# Patient Record
Sex: Male | Born: 1975 | Race: White | Hispanic: No | Marital: Single | Smoking: Current every day smoker
Health system: Southern US, Community
[De-identification: ages and names within clinical notes are randomized; demographics above are authoritative.]

## PROBLEM LIST (undated history)

## (undated) DIAGNOSIS — F319 Bipolar disorder, unspecified: Secondary | ICD-10-CM

## (undated) DIAGNOSIS — F200 Paranoid schizophrenia: Secondary | ICD-10-CM

## (undated) DIAGNOSIS — I10 Essential (primary) hypertension: Secondary | ICD-10-CM

## (undated) DIAGNOSIS — F1010 Alcohol abuse, uncomplicated: Secondary | ICD-10-CM

## (undated) HISTORY — PX: DENTAL SURGERY: SHX609

## (undated) HISTORY — DX: Essential (primary) hypertension: I10

## (undated) HISTORY — DX: Paranoid schizophrenia: F20.0

---

## 1997-08-07 ENCOUNTER — Emergency Department (HOSPITAL_COMMUNITY): Admission: EM | Admit: 1997-08-07 | Discharge: 1997-08-07 | Payer: Self-pay | Admitting: Emergency Medicine

## 1998-07-26 ENCOUNTER — Emergency Department (HOSPITAL_COMMUNITY): Admission: EM | Admit: 1998-07-26 | Discharge: 1998-07-26 | Payer: Self-pay | Admitting: Emergency Medicine

## 1998-07-26 ENCOUNTER — Encounter: Payer: Self-pay | Admitting: Emergency Medicine

## 2004-05-19 ENCOUNTER — Emergency Department: Payer: Self-pay | Admitting: Emergency Medicine

## 2005-11-14 ENCOUNTER — Emergency Department (HOSPITAL_COMMUNITY): Admission: EM | Admit: 2005-11-14 | Discharge: 2005-11-14 | Payer: Self-pay | Admitting: Emergency Medicine

## 2005-11-25 ENCOUNTER — Emergency Department (HOSPITAL_COMMUNITY): Admission: EM | Admit: 2005-11-25 | Discharge: 2005-11-25 | Payer: Self-pay | Admitting: Emergency Medicine

## 2012-05-25 ENCOUNTER — Emergency Department (HOSPITAL_COMMUNITY)
Admission: EM | Admit: 2012-05-25 | Discharge: 2012-05-26 | Disposition: A | Discharge status: Home / Self Care | Payer: Medicaid Other | Attending: Emergency Medicine | Admitting: Emergency Medicine

## 2012-05-25 DIAGNOSIS — F1010 Alcohol abuse, uncomplicated: Secondary | ICD-10-CM | POA: Insufficient documentation

## 2012-05-25 DIAGNOSIS — IMO0002 Reserved for concepts with insufficient information to code with codable children: Secondary | ICD-10-CM

## 2012-05-25 LAB — POCT I-STAT, CHEM 8
Calcium, Ion: 1.09 mmol/L — ABNORMAL LOW (ref 1.12–1.23)
Hemoglobin: 16.3 g/dL (ref 13.0–17.0)
Sodium: 141 mEq/L (ref 135–145)
TCO2: 20 mmol/L (ref 0–100)

## 2012-05-25 LAB — RAPID URINE DRUG SCREEN, HOSP PERFORMED
Barbiturates: NOT DETECTED
Tetrahydrocannabinol: NOT DETECTED

## 2012-05-25 LAB — SALICYLATE LEVEL: Salicylate Lvl: <2 mg/dL — ABNORMAL LOW (ref 2.8–20.0)

## 2012-05-25 LAB — ACETAMINOPHEN LEVEL: Acetaminophen (Tylenol), Serum: <15 ug/mL (ref 10–30)

## 2012-05-25 NOTE — ED Notes (Signed)
Pt daughter at bedside. States that he fell tonight on the floor from sitting position from couch to floor couch. When he feel he possibly tapped his head on the floor. She states there was some LOC because he did not respond to her nor his grandson jumping on his back playing with him.

## 2012-05-25 NOTE — ED Provider Notes (Addendum)
History     CSN: [id number]  Arrival date & time 05/25/12  2034   First MD Initiated Contact with Patient 05/25/12 2045      No chief complaint on file.   (Consider location/radiation/quality/duration/timing/severity/associated sxs/prior treatment) HPI Comments: EMS was called to the home by patients daughter who found him passed out in the home.  A number of empty beer bottles near him as well as a bottle of Seroquel "almost full"  EMS did not bring bottle nor note fill date and number of tablets missing.    The history is provided by the EMS personnel.    No past medical history on file.  No past surgical history on file.  No family history on file.  History  Substance Use Topics  . Smoking status: Not on file  . Smokeless tobacco: Not on file  . Alcohol Use: Not on file      Review of Systems  Unable to perform ROS: Patient unresponsive  Constitutional: Negative for fever.  All other systems reviewed and are negative.    Allergies  Review of patient's allergies indicates no known allergies.  Home Medications  No current outpatient prescriptions on file.  BP 136/80  Pulse 116  Temp(Src) 98 F (36.7 C) (Oral)  Resp 17  SpO2 98%  Physical Exam  Nursing note and vitals reviewed. Constitutional: He appears well-developed and well-nourished.  HENT:  Head: Normocephalic.  Eyes: Pupils are equal, round, and reactive to light.  Cardiovascular: Regular rhythm.  Tachycardia present.   Pulmonary/Chest: Effort normal and breath sounds normal. No respiratory distress. He has no wheezes.  Abdominal: Soft. Bowel sounds are normal. He exhibits no distension.  Musculoskeletal: He exhibits no tenderness.  Neurological:  Patient rouses to deep sternal rub   Skin: Skin is warm. No rash noted. He is not diaphoretic.    ED Course  Procedures (including critical care time)  Labs Reviewed  CBC WITH DIFFERENTIAL - Abnormal; Notable for the following:    Neutrophils  Relative 36 (*)    Lymphocytes Relative 50 (*)    All other components within normal limits  ETHANOL - Abnormal; Notable for the following:    Alcohol, Ethyl (B) 273 (*)    All other components within normal limits  SALICYLATE LEVEL - Abnormal; Notable for the following:    Salicylate Lvl <2.0 (*)    All other components within normal limits  POCT I-STAT, CHEM 8 - Abnormal; Notable for the following:    BUN 5 (*)    Glucose, Bld 103 (*)    Calcium, Ion 1.09 (*)    All other components within normal limits  URINE RAPID DRUG SCREEN (HOSP PERFORMED)  ACETAMINOPHEN LEVEL   No results found.   1. Intoxication       MDM   Patient received 2 L of IV fluids.  He's eaten a, meal.  He's been up  ambulating in the hallway.  He does not want detox.  Family is willing to transport him home        Arman Filter, NP 05/26/12 0117  Arman Filter, NP 06/03/12 2027

## 2012-05-25 NOTE — ED Notes (Signed)
ZOX:[id number]<AV> Expected date:05/25/12<BR> Expected time: 8:20 PM<BR> Means of arrival:Ambulance<BR> Comments:<BR> Etoh; medical clearance; decreased loc; syncopal episode;

## 2012-05-25 NOTE — ED Notes (Signed)
Possible Seroquel on OD. Per EMS bottle was found and almost full. There were several 40 oz can of alcohol on the counter. Pt found incontinent of urine. Denies recreational drug use. EMS was called by his daughter.  EKG unremarkable CBG 170

## 2012-05-25 NOTE — ED Notes (Signed)
Pt daughter : [phone]

## 2012-05-26 LAB — CBC WITH DIFFERENTIAL/PLATELET
Basophils Relative: 0 % (ref 0–1)
Eosinophils Absolute: 0.3 10*3/uL (ref 0.0–0.7)
Eosinophils Relative: 5 % (ref 0–5)
HCT: 43.1 % (ref 39.0–52.0)
Hemoglobin: 15 g/dL (ref 13.0–17.0)
MCH: 32 pg (ref 26.0–34.0)
MCHC: 34.8 g/dL (ref 30.0–36.0)
MCV: 91.9 fL (ref 78.0–100.0)
Monocytes Absolute: 0.6 10*3/uL (ref 0.1–1.0)
Monocytes Relative: 10 % (ref 3–12)
Neutrophils Relative %: 36 % — ABNORMAL LOW (ref 43–77)

## 2012-05-26 NOTE — ED Notes (Signed)
Pt walked to the bathroom with writer w/ an unsteady gait, pt is still unable to even put on his own pants without help.  Pt walked back to the stretcher and his knees buckled. Writer and RN helped pt back up to ambulate back onto the stretcher.

## 2012-05-26 NOTE — ED Notes (Signed)
Pt ambulated to the bathroom with minimum assistance. Requested black coffee.

## 2012-05-27 NOTE — ED Provider Notes (Signed)
Medical screening examination/treatment/procedure(s) were performed by non-physician practitioner and as supervising physician I was immediately available for consultation/collaboration.  Alysia Scism, MD 05/27/12 0608 

## 2012-06-03 NOTE — ED Provider Notes (Signed)
Medical screening examination/treatment/procedure(s) were performed by non-physician practitioner and as supervising physician I was immediately available for consultation/collaboration.   Charles B. Sheldon, MD 06/03/12 2338 

## 2015-04-04 ENCOUNTER — Encounter: Payer: Self-pay | Admitting: *Deleted

## 2015-04-04 ENCOUNTER — Ambulatory Visit (INDEPENDENT_AMBULATORY_CARE_PROVIDER_SITE_OTHER): Payer: Medicaid Other | Admitting: Cardiovascular Disease

## 2015-04-04 VITALS — BP 148/92 | HR 84 | Ht 72.0 in | Wt 224.0 lb

## 2015-04-04 DIAGNOSIS — Z720 Tobacco use: Secondary | ICD-10-CM

## 2015-04-04 DIAGNOSIS — R079 Chest pain, unspecified: Secondary | ICD-10-CM | POA: Diagnosis not present

## 2015-04-04 DIAGNOSIS — I10 Essential (primary) hypertension: Secondary | ICD-10-CM

## 2015-04-04 DIAGNOSIS — R0601 Orthopnea: Secondary | ICD-10-CM

## 2015-04-04 DIAGNOSIS — Z8249 Family history of ischemic heart disease and other diseases of the circulatory system: Secondary | ICD-10-CM | POA: Diagnosis not present

## 2015-04-04 MED ORDER — AMLODIPINE BESYLATE 5 MG PO TABS: 5.0000 mg | ORAL_TABLET | Freq: Every day | ORAL | Status: DC

## 2015-04-04 NOTE — Patient Instructions (Signed)
   Begin Norvasc  daily - new sent to [facility]. Continue all other medications.   Your physician has requested that you have an echocardiogram. Echocardiography is a painless test that uses sound waves to create images of your heart. It provides your doctor with information about the size and shape of your heart and how well your heart's chambers and valves are working. This procedure takes approximately one hour. There are no restrictions for this procedure. Office will contact with results via phone or letter.

## 2015-04-04 NOTE — Progress Notes (Signed)
Patient ID: Leonard Guerrero, male   DOB: Jul 26, 1975, 40 y.o.   MRN: [id number]       CARDIOLOGY CONSULT NOTE  Patient ID: Leonard Guerrero MRN: [id number] DOB/AGE: May 10, 1975 40 y.o.  Admit date: (Not on file) Primary Physician Alvina Filbert, MD  Reason for Consultation: chest pain  HPI: The patient is a 40 year old male with a history of paranoid schizophrenia and no known cardiovascular disease who is referred for evaluation of chest pain. He has chest pain primarily when lying down at night. Denies exertional chest pain. Has chronic exertional dyspnea which he attributes to a long history of smoking 2 packs daily since he was a teenager. He says he has to prop himself up on several pillows in order to breathe comfortably at night. Denies paroxysmal nocturnal dyspnea and leg swelling. Also denies dizziness, palpitations, and syncope. Said he had an ECG performed 2 weeks ago but neither outpatient notes or this ECG are unavailable to me at this time.  He said he was on blood pressure medications when he previously lived in [place] but after moving to [place] the first time, he was taken off of them. Said his blood pressure has been elevated at the last 6 visits with his psychologist.  He is moving to [place] this Jun 22, 2022.  Fam: Father died of "enlarged heart" at 39, grandmother died of same at 51.    No Known Allergies  Current Outpatient Prescriptions  Medication Sig Dispense Refill  . benztropine (COGENTIN) 1 MG tablet Take 1 mg by mouth every morning. & 1/2 tablet at bedtime    . haloperidol (HALDOL) 0.5 MG tablet Take 0.5 mg by mouth 2 (two) times daily.    Marland Kitchen lamoTRIgine (LAMICTAL) 25 MG tablet Take 25 mg by mouth 2 (two) times daily.    . Paliperidone Palmitate (INVEGA TRINZA IM) Inject into the muscle.    Marland Kitchen QUEtiapine (SEROQUEL XR) 400 MG 24 hr tablet Take 800 mg by mouth at bedtime.     No current facility-administered medications for this visit.    Past Medical History   Diagnosis Date  . Hypertension   . Paranoid schizophrenia (HCC)     No past surgical history on file.  Social History   Social History  . Marital Status: Single    Spouse Name: N/A  . Number of Children: N/A  . Years of Education: N/A   Occupational History  . Not on file.   Social History Main Topics  . Smoking status: Current Every Day Smoker -- 1.50 packs/day    Types: Cigarettes  . Smokeless tobacco: Never Used  . Alcohol Use: 0.0 oz/week    0 Standard drinks or equivalent per week  . Drug Use: Not on file  . Sexual Activity: Not on file   Other Topics Concern  . Not on file   Social History Narrative  . No narrative on file      Prior to Admission medications   Medication Sig Start Date End Date Taking? Authorizing Provider  benztropine (COGENTIN) 1 MG tablet Take 1 mg by mouth every morning. & 1/2 tablet at bedtime   Yes Historical Provider, MD  haloperidol (HALDOL) 0.5 MG tablet Take 0.5 mg by mouth 2 (two) times daily.   Yes Historical Provider, MD  lamoTRIgine (LAMICTAL) 25 MG tablet Take 25 mg by mouth 2 (two) times daily.   Yes Historical Provider, MD  Paliperidone Palmitate (INVEGA TRINZA IM) Inject into the muscle.   Yes Historical Provider, MD  QUEtiapine (  SEROQUEL XR) 400 MG 24 hr tablet Take 800 mg by mouth at bedtime.   Yes Historical Provider, MD     Review of systems complete and found to be negative unless listed above in HPI     Physical exam Blood pressure 148/92, pulse 84, height 6' (1.829 m), weight 224 lb (101.606 kg), SpO2 98 %. General: NAD Neck: No JVD, no thyromegaly or thyroid nodule.  Lungs: Diminished throughout, without rales or wheezes. CV: Nondisplaced PMI. Regular rate and rhythm, normal S1/S2, no S3/S4, no murmur.  No peripheral edema.  No carotid bruit.   Abdomen: Soft, nontender, no distention.  Skin: Intact without lesions or rashes.  Neurologic: Alert and oriented x 3.  Psych: Normal affect. Extremities: No  clubbing or cyanosis.  HEENT: Normal.   ECG: Most recent ECG reviewed.  Labs:   Lab Results  Component Value Date   WBC 6.0 05/25/2012   HGB 16.3 05/25/2012   HCT 48.0 05/25/2012   MCV 91.9 05/25/2012   PLT 161 05/25/2012   No results for input(s): NA, K, CL, CO2, BUN, CREATININE, CALCIUM, PROT, BILITOT, ALKPHOS, ALT, AST, GLUCOSE in the last 168 hours.  Invalid input(s): LABALBU No results found for: CKTOTAL, CKMB, CKMBINDEX, TROPONINI No results found for: CHOL No results found for: HDL No results found for: LDLCALC No results found for: TRIG No results found for: CHOLHDL No results found for: LDLDIRECT       Studies: No results found.  ASSESSMENT AND PLAN:  1. Chest pain: Atypical features for ischemic heart disease, but has several CV risk factors including essential hypertension, tobacco abuse, and family history significant for premature coronary artery disease. Because he is moving to [place] this Friday, I am unable to pursue stress testing. He will need to have one done when he moves to [place]. I will try to obtain a copy of his ECG performed 2 weeks ago. I will order a 2-D echocardiogram with Doppler to evaluate cardiac structure, function, and regional wall motion.  2. Orthopnea: Symptoms suspicious for left ventricular dysfunction/CHF. I will order a 2-D echocardiogram with Doppler to evaluate cardiac structure, function, and regional wall motion.   3. Essential HTN: Will provide a 90-day supply of amlodipine 5 mg daily.  Dispo: f/u prn.     Signed: Prentice Docker, M.D., F.A.C.C.  04/04/2015, 10:15 AM

## 2015-04-05 ENCOUNTER — Other Ambulatory Visit: Payer: Self-pay

## 2015-04-05 ENCOUNTER — Ambulatory Visit (INDEPENDENT_AMBULATORY_CARE_PROVIDER_SITE_OTHER): Payer: Medicaid Other

## 2015-04-05 DIAGNOSIS — R079 Chest pain, unspecified: Secondary | ICD-10-CM

## 2015-04-05 DIAGNOSIS — R0601 Orthopnea: Secondary | ICD-10-CM

## 2015-04-06 ENCOUNTER — Telehealth: Payer: Self-pay | Admitting: *Deleted

## 2015-04-06 NOTE — Telephone Encounter (Signed)
Notes Recorded by Lesle Chris, LPN on 0/10/8117 at 1:42 PM Patient notified. Copy to pmd.

## 2015-04-06 NOTE — Telephone Encounter (Signed)
-----   Message from Laqueta Linden, MD sent at 04/06/2015 11:05 AM EST ----- Normal pumping function.

## 2016-06-07 ENCOUNTER — Emergency Department (HOSPITAL_COMMUNITY): Payer: Medicaid Other

## 2016-06-07 ENCOUNTER — Emergency Department (HOSPITAL_COMMUNITY): DRG: 917 | Payer: Medicaid Other | Attending: Internal Medicine

## 2016-06-07 ENCOUNTER — Inpatient Hospital Stay (HOSPITAL_COMMUNITY)
Admission: EM | Admit: 2016-06-07 | Discharge: 2016-06-13 | Disposition: A | Discharge status: Other Acute Inpatient Hospital | Payer: Medicaid Other | Source: Home / Self Care | Attending: Pulmonary Disease | Admitting: Pulmonary Disease

## 2016-06-07 ENCOUNTER — Encounter (HOSPITAL_COMMUNITY): Payer: Self-pay | Admitting: Emergency Medicine

## 2016-06-07 DIAGNOSIS — J9601 Acute respiratory failure with hypoxia: Secondary | ICD-10-CM

## 2016-06-07 DIAGNOSIS — F10120 Alcohol abuse with intoxication, uncomplicated: Secondary | ICD-10-CM

## 2016-06-07 DIAGNOSIS — G92 Toxic encephalopathy: Secondary | ICD-10-CM | POA: Diagnosis present

## 2016-06-07 DIAGNOSIS — J690 Pneumonitis due to inhalation of food and vomit: Secondary | ICD-10-CM

## 2016-06-07 DIAGNOSIS — G40909 Epilepsy, unspecified, not intractable, without status epilepticus: Secondary | ICD-10-CM | POA: Diagnosis present

## 2016-06-07 DIAGNOSIS — J9600 Acute respiratory failure, unspecified whether with hypoxia or hypercapnia: Secondary | ICD-10-CM

## 2016-06-07 DIAGNOSIS — T50902A Poisoning by unspecified drugs, medicaments and biological substances, intentional self-harm, initial encounter: Secondary | ICD-10-CM | POA: Diagnosis present

## 2016-06-07 DIAGNOSIS — E860 Dehydration: Secondary | ICD-10-CM

## 2016-06-07 DIAGNOSIS — F339 Major depressive disorder, recurrent, unspecified: Secondary | ICD-10-CM | POA: Diagnosis not present

## 2016-06-07 DIAGNOSIS — F17210 Nicotine dependence, cigarettes, uncomplicated: Secondary | ICD-10-CM | POA: Diagnosis not present

## 2016-06-07 DIAGNOSIS — E876 Hypokalemia: Secondary | ICD-10-CM

## 2016-06-07 DIAGNOSIS — Z79899 Other long term (current) drug therapy: Secondary | ICD-10-CM

## 2016-06-07 DIAGNOSIS — E872 Acidosis: Secondary | ICD-10-CM | POA: Diagnosis present

## 2016-06-07 DIAGNOSIS — R001 Bradycardia, unspecified: Secondary | ICD-10-CM | POA: Diagnosis present

## 2016-06-07 DIAGNOSIS — Y92009 Unspecified place in unspecified non-institutional (private) residence as the place of occurrence of the external cause: Secondary | ICD-10-CM | POA: Diagnosis not present

## 2016-06-07 DIAGNOSIS — T1491XA Suicide attempt, initial encounter: Secondary | ICD-10-CM | POA: Diagnosis not present

## 2016-06-07 DIAGNOSIS — G9340 Encephalopathy, unspecified: Secondary | ICD-10-CM | POA: Diagnosis present

## 2016-06-07 DIAGNOSIS — E162 Hypoglycemia, unspecified: Secondary | ICD-10-CM | POA: Diagnosis present

## 2016-06-07 DIAGNOSIS — F200 Paranoid schizophrenia: Secondary | ICD-10-CM

## 2016-06-07 DIAGNOSIS — R402431 Glasgow coma scale score 3-8, in the field [EMT or ambulance]: Secondary | ICD-10-CM | POA: Diagnosis present

## 2016-06-07 DIAGNOSIS — F203 Undifferentiated schizophrenia: Secondary | ICD-10-CM | POA: Diagnosis not present

## 2016-06-07 DIAGNOSIS — F10920 Alcohol use, unspecified with intoxication, uncomplicated: Secondary | ICD-10-CM | POA: Diagnosis not present

## 2016-06-07 DIAGNOSIS — I272 Pulmonary hypertension, unspecified: Secondary | ICD-10-CM | POA: Diagnosis present

## 2016-06-07 DIAGNOSIS — I959 Hypotension, unspecified: Secondary | ICD-10-CM

## 2016-06-07 DIAGNOSIS — R4182 Altered mental status, unspecified: Secondary | ICD-10-CM | POA: Diagnosis not present

## 2016-06-07 DIAGNOSIS — B953 Streptococcus pneumoniae as the cause of diseases classified elsewhere: Secondary | ICD-10-CM | POA: Diagnosis present

## 2016-06-07 DIAGNOSIS — F1099 Alcohol use, unspecified with unspecified alcohol-induced disorder: Secondary | ICD-10-CM | POA: Diagnosis not present

## 2016-06-07 DIAGNOSIS — T510X2A Toxic effect of ethanol, intentional self-harm, initial encounter: Secondary | ICD-10-CM | POA: Diagnosis present

## 2016-06-07 DIAGNOSIS — Z4659 Encounter for fitting and adjustment of other gastrointestinal appliance and device: Secondary | ICD-10-CM

## 2016-06-07 DIAGNOSIS — R45851 Suicidal ideations: Secondary | ICD-10-CM | POA: Diagnosis not present

## 2016-06-07 DIAGNOSIS — F1420 Cocaine dependence, uncomplicated: Secondary | ICD-10-CM | POA: Diagnosis not present

## 2016-06-07 DIAGNOSIS — R0600 Dyspnea, unspecified: Secondary | ICD-10-CM

## 2016-06-07 HISTORY — DX: Bipolar disorder, unspecified: F31.9

## 2016-06-07 HISTORY — DX: Alcohol abuse, uncomplicated: F10.10

## 2016-06-07 LAB — COMPREHENSIVE METABOLIC PANEL
ALBUMIN: 3.7 g/dL (ref 3.5–5.0)
ALT: 26 U/L (ref 17–63)
AST: 33 U/L (ref 15–41)
Alkaline Phosphatase: 52 U/L (ref 38–126)
Anion gap: 13 (ref 5–15)
BUN: 8 mg/dL (ref 6–20)
CHLORIDE: 106 mmol/L (ref 101–111)
CO2: 18 mmol/L — ABNORMAL LOW (ref 22–32)
Calcium: 8.5 mg/dL — ABNORMAL LOW (ref 8.9–10.3)
Creatinine, Ser: 1.16 mg/dL (ref 0.61–1.24)
GFR calc Af Amer: >60 mL/min (ref >60)
GFR calc non Af Amer: >60 mL/min (ref >60)
GLUCOSE: 134 mg/dL — AB (ref 65–99)
POTASSIUM: 4.2 mmol/L (ref 3.5–5.1)
SODIUM: 137 mmol/L (ref 135–145)
Total Bilirubin: 0.9 mg/dL (ref 0.3–1.2)
Total Protein: 6.2 g/dL — ABNORMAL LOW (ref 6.5–8.1)

## 2016-06-07 LAB — CBC WITH DIFFERENTIAL/PLATELET
BASOS ABS: 0 10*3/uL (ref 0.0–0.1)
BASOS PCT: 0 %
EOS PCT: 2 %
Eosinophils Absolute: 0.2 10*3/uL (ref 0.0–0.7)
HEMATOCRIT: 38.1 % — AB (ref 39.0–52.0)
Hemoglobin: 12.9 g/dL — ABNORMAL LOW (ref 13.0–17.0)
LYMPHS ABS: 2.6 10*3/uL (ref 0.7–4.0)
LYMPHS PCT: 22 %
MCH: 31.8 pg (ref 26.0–34.0)
MCHC: 33.9 g/dL (ref 30.0–36.0)
MCV: 93.8 fL (ref 78.0–100.0)
Monocytes Absolute: 0.7 10*3/uL (ref 0.1–1.0)
Monocytes Relative: 6 %
Neutro Abs: 8.4 10*3/uL — ABNORMAL HIGH (ref 1.7–7.7)
Neutrophils Relative %: 70 %
PLATELETS: 174 10*3/uL (ref 150–400)
RBC: 4.06 MIL/uL — ABNORMAL LOW (ref 4.22–5.81)
RDW: 14.9 % (ref 11.5–15.5)
WBC: 12 10*3/uL — ABNORMAL HIGH (ref 4.0–10.5)

## 2016-06-07 LAB — I-STAT CHEM 8, ED
BUN: 9 mg/dL (ref 6–20)
Calcium, Ion: 1.04 mmol/L — ABNORMAL LOW (ref 1.15–1.40)
Chloride: 109 mmol/L (ref 101–111)
Creatinine, Ser: 1.2 mg/dL (ref 0.61–1.24)
Glucose, Bld: 129 mg/dL — ABNORMAL HIGH (ref 65–99)
HEMATOCRIT: 39 % (ref 39.0–52.0)
HEMOGLOBIN: 13.3 g/dL (ref 13.0–17.0)
Potassium: 4.4 mmol/L (ref 3.5–5.1)
SODIUM: 140 mmol/L (ref 135–145)
TCO2: 23 mmol/L (ref 0–100)

## 2016-06-07 LAB — I-STAT ARTERIAL BLOOD GAS, ED
ACID-BASE DEFICIT: 9 mmol/L — AB (ref 0.0–2.0)
BICARBONATE: 17.6 mmol/L — AB (ref 20.0–28.0)
O2 Saturation: 100 %
TCO2: 19 mmol/L (ref 0–100)
pCO2 arterial: 39.2 mmHg (ref 32.0–48.0)
pH, Arterial: 7.255 — ABNORMAL LOW (ref 7.350–7.450)
pO2, Arterial: 242 mmHg — ABNORMAL HIGH (ref 83.0–108.0)

## 2016-06-07 LAB — I-STAT CG4 LACTIC ACID, ED: LACTIC ACID, VENOUS: 2.92 mmol/L — AB (ref 0.5–1.9)

## 2016-06-07 LAB — ETHANOL: ALCOHOL ETHYL (B): 16 mg/dL — AB (ref <5)

## 2016-06-07 LAB — SALICYLATE LEVEL

## 2016-06-07 LAB — AMMONIA: AMMONIA: 51 umol/L — AB (ref 9–35)

## 2016-06-07 LAB — ACETAMINOPHEN LEVEL: Acetaminophen (Tylenol), Serum: 48 ug/mL — ABNORMAL HIGH (ref 10–30)

## 2016-06-07 MED ORDER — LORAZEPAM 2 MG/ML IJ SOLN: 1.0000 mg | INTRAMUSCULAR | Status: DC | PRN

## 2016-06-07 MED ORDER — ASPIRIN 300 MG RE SUPP: 300.0000 mg | RECTAL | Status: DC

## 2016-06-07 MED ORDER — ADULT MULTIVITAMIN W/MINERALS CH
1.0000 | ORAL_TABLET | Freq: Every day | ORAL | Status: DC
  Administered 2016-06-08: 1 via ORAL
  Filled 2016-06-07: qty 1

## 2016-06-07 MED ORDER — VITAMIN B-1 100 MG PO TABS
100.0000 mg | ORAL_TABLET | Freq: Every day | ORAL | Status: DC
  Administered 2016-06-08 – 2016-06-13 (×5): 100 mg via ORAL
  Filled 2016-06-07 (×6): qty 1

## 2016-06-07 MED ORDER — ONDANSETRON HCL 4 MG/2ML IJ SOLN: 4.0000 mg | Freq: Once | INTRAMUSCULAR | Status: DC

## 2016-06-07 MED ORDER — FAMOTIDINE 40 MG/5ML PO SUSR
20.0000 mg | Freq: Two times a day (BID) | ORAL | Status: DC
  Administered 2016-06-08 – 2016-06-09 (×3): 20 mg
  Filled 2016-06-07 (×5): qty 2.5

## 2016-06-07 MED ORDER — ENOXAPARIN SODIUM 40 MG/0.4ML ~~LOC~~ SOLN
40.0000 mg | SUBCUTANEOUS | Status: DC
  Administered 2016-06-08 – 2016-06-13 (×6): 40 mg via SUBCUTANEOUS
  Filled 2016-06-07 (×6): qty 0.4

## 2016-06-07 MED ORDER — MIDAZOLAM HCL 2 MG/2ML IJ SOLN: 2.0000 mg | INTRAMUSCULAR | Status: DC | PRN

## 2016-06-07 MED ORDER — SODIUM CHLORIDE 0.9 % IV SOLN: 0.2000 ug/kg/h | INTRAVENOUS | Status: DC

## 2016-06-07 MED ORDER — FOLIC ACID 1 MG PO TABS
1.0000 mg | ORAL_TABLET | Freq: Every day | ORAL | Status: DC
  Administered 2016-06-08 – 2016-06-13 (×5): 1 mg via ORAL
  Filled 2016-06-07 (×6): qty 1

## 2016-06-07 MED ORDER — SODIUM CHLORIDE 0.9 % IV BOLUS (SEPSIS)
1000.0000 mL | Freq: Once | INTRAVENOUS | Status: DC
  Administered 2016-06-07: 1000 mL via INTRAVENOUS

## 2016-06-07 MED ORDER — ASPIRIN 81 MG PO CHEW: 324.0000 mg | CHEWABLE_TABLET | ORAL | Status: DC

## 2016-06-07 MED ORDER — SODIUM CHLORIDE 0.9 % IV BOLUS (SEPSIS)
2000.0000 mL | Freq: Once | INTRAVENOUS | Status: DC
  Administered 2016-06-07: 2000 mL via INTRAVENOUS

## 2016-06-07 MED ORDER — FENTANYL CITRATE (PF) 100 MCG/2ML IJ SOLN: 100.0000 ug | INTRAMUSCULAR | Status: DC | PRN

## 2016-06-07 MED ORDER — SODIUM CHLORIDE 0.9 % IV SOLN
250.0000 mL | INTRAVENOUS | Status: DC | PRN
  Administered 2016-06-08: 250 mL via INTRAVENOUS

## 2016-06-07 NOTE — ED Provider Notes (Signed)
[facility] DEPT Provider Note   CSN: [id number] Arrival date & time: 06/07/16  2142     History   Chief Complaint Chief Complaint  Patient presents with  . Drug Overdose    HPI Leonard Guerrero is a 41 y.o. male.  Patient arrives with EMS found down for unknown time by roommate. Patient found by EMS to be bradycardic and hypotensive that responded to oxygen and 250 mL of fluid. GCS has been between 8 and 10 per EMS. Patient appears to have improving mentation upon arrival per EMS. Patient bradycardic in the 50s but otherwise vitals within normal limits. Patient had normal blood glucose with EMS and no Narcan given.   The history is provided by the patient.  Altered Mental Status   This is a new problem. Episode onset: unknown. The problem has not changed since onset.Associated symptoms include somnolence and unresponsiveness. His past medical history is significant for seizures (possibly? lamotrigine rx with EMS).    No past medical history on file.  Patient Active Problem List   Diagnosis Date Noted  . Acute encephalopathy 06/08/2016  . Encephalopathy acute 06/07/2016    History reviewed. No pertinent surgical history.     Home Medications    Prior to Admission medications   Not on File    Family History No family history on file.  Social History Social History  Substance Use Topics  . Smoking status: Not on file  . Smokeless tobacco: Not on file  . Alcohol use Not on file     Allergies   Patient has no allergy information on record.   Review of Systems Review of Systems  Unable to perform ROS: Acuity of condition     Physical Exam Updated Vital Signs BP (!) 77/50   Pulse (!) 31   Temp 97.8 F (36.6 C) (Oral)   Resp 16   Ht 6' (1.829 m)   Wt 88.6 kg   SpO2 94%   BMI 26.49 kg/m   Physical Exam  Constitutional: He appears listless. He has a sickly appearance. He appears distressed.  HENT:  Head: Normocephalic and atraumatic.    Mouth/Throat: No oropharyngeal exudate.  Eyes: Conjunctivae are normal. Pupils are equal, round, and reactive to light.  Neck: Neck supple. No tracheal deviation present.  In c-collar  Cardiovascular: Regular rhythm, normal heart sounds and intact distal pulses.  Bradycardia present.   No murmur heard. Pulmonary/Chest: Breath sounds normal. He has no wheezes.  Poor effort  Abdominal: Soft. There is no tenderness.  Neurological: He appears listless.  Opens eyes to commands, localizes to pain, moves all extremities   Skin: Skin is warm. Capillary refill takes less than 2 seconds. He is not diaphoretic.     ED Treatments / Results  Labs (all labs ordered are listed, but only abnormal results are displayed) Labs Reviewed  COMPREHENSIVE METABOLIC PANEL - Abnormal; Notable for the following:       Result Value   CO2 18 (*)    Glucose, Bld 134 (*)    Calcium 8.5 (*)    Total Protein 6.2 (*)    All other components within normal limits  CBC WITH DIFFERENTIAL/PLATELET - Abnormal; Notable for the following:    WBC 12.0 (*)    RBC 4.06 (*)    Hemoglobin 12.9 (*)    HCT 38.1 (*)    Neutro Abs 8.4 (*)    All other components within normal limits  ACETAMINOPHEN LEVEL - Abnormal; Notable for the following:  Acetaminophen (Tylenol), Serum 48 (*)    All other components within normal limits  ETHANOL - Abnormal; Notable for the following:    Alcohol, Ethyl (B) 16 (*)    All other components within normal limits  AMMONIA - Abnormal; Notable for the following:    Ammonia 51 (*)    All other components within normal limits  RAPID URINE DRUG SCREEN, HOSP PERFORMED - Abnormal; Notable for the following:    Cocaine POSITIVE (*)    All other components within normal limits  CBC - Abnormal; Notable for the following:    WBC 11.6 (*)    RBC 3.98 (*)    Hemoglobin 12.5 (*)    HCT 37.8 (*)    Platelets 134 (*)    All other components within normal limits  GLUCOSE, CAPILLARY - Abnormal;  Notable for the following:    Glucose-Capillary 130 (*)    All other components within normal limits  I-STAT CG4 LACTIC ACID, ED - Abnormal; Notable for the following:    Lactic Acid, Venous 2.92 (*)    All other components within normal limits  I-STAT CHEM 8, ED - Abnormal; Notable for the following:    Glucose, Bld 129 (*)    Calcium, Ion 1.04 (*)    All other components within normal limits  I-STAT ARTERIAL BLOOD GAS, ED - Abnormal; Notable for the following:    pH, Arterial 7.255 (*)    pO2, Arterial 242.0 (*)    Bicarbonate 17.6 (*)    Acid-base deficit 9.0 (*)    All other components within normal limits  CULTURE, BLOOD (ROUTINE X 2)  CULTURE, BLOOD (ROUTINE X 2)  MRSA PCR SCREENING  SALICYLATE LEVEL  LACTIC ACID, PLASMA  PROTIME-INR  HIV ANTIBODY (ROUTINE TESTING)  CREATININE, SERUM  COMPREHENSIVE METABOLIC PANEL  MAGNESIUM  PHOSPHORUS  LIPASE, BLOOD  TROPONIN I  TROPONIN I  TROPONIN I  LACTIC ACID, PLASMA  CBC  URINALYSIS, ROUTINE W REFLEX MICROSCOPIC  ACETAMINOPHEN LEVEL  TRIGLYCERIDES  LAMOTRIGINE LEVEL    EKG  EKG Interpretation  Date/Time:  Friday Jun 07 2016 21:57:33 EDT Ventricular Rate:  56 PR Interval:    QRS Duration: 113 QT Interval:  441 QTC Calculation: 426 R Axis:   83 Text Interpretation:  Sinus rhythm Incomplete right bundle branch block ST elevation suggests acute pericarditis Confirmed by ZAVITZ MD, Vonna Kotyk [phone]) on 06/07/2016 10:34:17 PM       Radiology Ct Head Wo Contrast  Result Date: 06/07/2016 CLINICAL DATA:  Possible overdose EXAM: CT HEAD WITHOUT CONTRAST CT CERVICAL SPINE WITHOUT CONTRAST TECHNIQUE: Multidetector CT imaging of the head and cervical spine was performed following the standard protocol without intravenous contrast. Multiplanar CT image reconstructions of the cervical spine were also generated. COMPARISON:  None. FINDINGS: CT HEAD FINDINGS BRAIN: The ventricles and sulci are normal. No intraparenchymal hemorrhage,  mass effect nor midline shift. No acute large vascular territory infarcts. No abnormal extra-axial fluid collections. Basal cisterns are midline and not effaced. No acute cerebellar abnormality. VASCULAR: Unremarkable. SKULL/SOFT TISSUES: No skull fracture. No significant soft tissue swelling. ORBITS/SINUSES: The included ocular globes and orbital contents are normal.Moderate ethmoid and mild to moderate frontal sinus mucosal thickening. OTHER: None. CT CERVICAL SPINE FINDINGS ALIGNMENT: Vertebral bodies in alignment. Maintained lordosis. SKULL BASE AND VERTEBRAE: Cervical vertebral bodies and posterior elements are intact. Intervertebral disc heights preserved. No destructive bony lesions. C1-2 articulation maintained. SOFT TISSUES AND SPINAL CANAL: Normal. DISC LEVELS: No significant osseous canal stenosis or neural foraminal narrowing.  UPPER CHEST: Lung apices are clear. OTHER: Endotracheal tube and gastric tubes are partially imaged along the course of the upper trachea and visualized esophagus respectively. IMPRESSION: 1. No acute intracranial abnormality. The visualized paranasal sinuses are notable for mild-to-moderate mucosal thickening of the ethmoid and frontal sinuses. 2. No acute posttraumatic cervical spine fracture or subluxation. Electronically Signed   By: Ashley Royalty M.D.   On: 06/07/2016 22:40   Ct Cervical Spine Wo Contrast  Result Date: 06/07/2016 CLINICAL DATA:  Possible overdose EXAM: CT HEAD WITHOUT CONTRAST CT CERVICAL SPINE WITHOUT CONTRAST TECHNIQUE: Multidetector CT imaging of the head and cervical spine was performed following the standard protocol without intravenous contrast. Multiplanar CT image reconstructions of the cervical spine were also generated. COMPARISON:  None. FINDINGS: CT HEAD FINDINGS BRAIN: The ventricles and sulci are normal. No intraparenchymal hemorrhage, mass effect nor midline shift. No acute large vascular territory infarcts. No abnormal extra-axial fluid  collections. Basal cisterns are midline and not effaced. No acute cerebellar abnormality. VASCULAR: Unremarkable. SKULL/SOFT TISSUES: No skull fracture. No significant soft tissue swelling. ORBITS/SINUSES: The included ocular globes and orbital contents are normal.Moderate ethmoid and mild to moderate frontal sinus mucosal thickening. OTHER: None. CT CERVICAL SPINE FINDINGS ALIGNMENT: Vertebral bodies in alignment. Maintained lordosis. SKULL BASE AND VERTEBRAE: Cervical vertebral bodies and posterior elements are intact. Intervertebral disc heights preserved. No destructive bony lesions. C1-2 articulation maintained. SOFT TISSUES AND SPINAL CANAL: Normal. DISC LEVELS: No significant osseous canal stenosis or neural foraminal narrowing. UPPER CHEST: Lung apices are clear. OTHER: Endotracheal tube and gastric tubes are partially imaged along the course of the upper trachea and visualized esophagus respectively. IMPRESSION: 1. No acute intracranial abnormality. The visualized paranasal sinuses are notable for mild-to-moderate mucosal thickening of the ethmoid and frontal sinuses. 2. No acute posttraumatic cervical spine fracture or subluxation. Electronically Signed   By: Ashley Royalty M.D.   On: 06/07/2016 22:40   Dg Chest Portable 1 View  Result Date: 06/07/2016 CLINICAL DATA:  Drug overdose.  Post intubation. EXAM: PORTABLE CHEST 1 VIEW COMPARISON:  None. FINDINGS: An ET tube is identified, terminating 6 cm above the carina in good position. The distal NG tube is not well seen. No pneumothorax. The right lung is clear. Haziness projects over the left lung. No nodule or mass. Cardiomegaly. The hila and mediastinum are unremarkable. IMPRESSION: 1. The ETT is in good position. The distal NG tube is not well visualized. 2. Haziness over the left chest could be due to patient rotation. Asymmetric edema or less likely a developing infiltrate is not completely excluded. Electronically Signed   By: Dorise Bullion III M.D    On: 06/07/2016 22:12   Dg Abd Portable 1v  Result Date: 06/08/2016 CLINICAL DATA:  OG tube placement vomiting EXAM: PORTABLE ABDOMEN - 1 VIEW COMPARISON:  None. FINDINGS: Left lung base is clear. Esophageal tube tip overlies the proximal stomach, side-port appears to be at the GE junction. IMPRESSION: Esophageal tube tip overlies the proximal stomach, side-port projects over GE junction, further advancement by at least 5 cm is suggested for more optimal positioning. These results will be called to the ordering clinician or representative by the Radiologist Assistant, and communication documented in the PACS or zVision Dashboard. Electronically Signed   By: Donavan Foil M.D.   On: 06/08/2016 03:22    Procedures Procedure Name: Intubation Date/Time: 06/08/2016 3:10 AM Performed by: Ronnald Nian, Hemi Chacko Pre-anesthesia Checklist: Patient identified, Patient being monitored, Emergency Drugs available, Timeout performed and Suction available Oxygen Delivery  Method: Ambu bag Preoxygenation: Pre-oxygenation with 100% oxygen Intubation Type: Rapid sequence Ventilation: Mask ventilation without difficulty Laryngoscope Size: Mac, Glidescope and 4 Grade View: Grade I Tube size: 7.5 mm Number of attempts: 1 Airway Equipment and Method: Video-laryngoscopy and Rigid stylet Placement Confirmation: ETT inserted through vocal cords under direct vision,  Positive ETCO2 and Breath sounds checked- equal and bilateral Secured at: 22 cm Tube secured with: ETT holder Dental Injury: Teeth and Oropharynx as per pre-operative assessment  Comments: Bedside cxr showed adequate tube placement, ET tube advance 2 cm for optimal placement.      (including critical care time)  Medications Ordered in ED Medications  0.9 %  sodium chloride infusion (not administered)  enoxaparin (LOVENOX) injection 40 mg (not administered)  famotidine (PEPCID) 40 MG/5ML suspension 20 mg (not administered)  thiamine (VITAMIN B-1) tablet  100 mg (not administered)  folic acid (FOLVITE) tablet 1 mg (not administered)  multivitamin with minerals tablet 1 tablet (not administered)  LORazepam (ATIVAN) injection 1-2 mg (not administered)  propofol (DIPRIVAN) 1000 MG/100ML infusion (0 mcg/kg/min  90.7 kg Intravenous Stopped 06/08/16 0230)  glucagon (human recombinant) (GLUCAGEN) 5 mg in dextrose 5 % 50 mL (0.1 mg/mL) infusion (not administered)  activated charcoal-sorbitol (ACTIDOSE-SORBITOL) suspension 50 g (not administered)  calcium gluconate inj 10% (1 g) URGENT USE ONLY! (not administered)  rocuronium [facility]) injection (20 mg Intravenous Given 06/07/16 2152)  naloxone [facility]) injection 2 mg (2 mg Intravenous Given 06/07/16 2140)  sodium chloride 0.9 % bolus 500 mL (0 mLs Intravenous Stopped 06/08/16 0318)     Initial Impression / Assessment and Plan / ED Course  I have reviewed the triage vital signs and the nursing notes.  Pertinent labs & imaging results that were available during my care of the patient were reviewed by me and considered in my medical decision making (see chart for details).     Leonard Guerrero is a 41 year old male with no known medical history who arrives with EMS as a level 1 code for altered mental status.  EMS states that patient was found by his roommate unresponsive with no clear history.  Patient supposedly on antiseizure medications with Motrin gene and has had possible seizure-like activity today but has multiple prescription bottles at home including metoprolol, gabapentin.  Multiple bottles appear have full and soft MD and concern for possible overdose.  Patient was found to be hypotensive and bradycardic with EMS but those vital signs improved following 250 cc of normal saline.  Vitals at time of arrival to the ED were significant for bradycardia in the 40s with EKG showing sinus bradycardia.  Patient with airway, breathing, circulation intact at time of arrival to the ED.  Patient able to follow some  commands, moving all extremities spontaneously and open his eyes to commands and withdraw localized pain.  Patient had blood glucose within normal limits.  Patient with normal blood pressure despite bradycardia.  No obvious signs of seizure-like activities.  Patient was given 2 mg of Narcan with no change.  Patient started to have large amount of intractable emesis and was subsequently intubated using a glide scope with grade 1 view using etomidate and rocuronium without any difficulty for airway protection. Patient aggressively suctioned prior to intubation.  OG tube was placed and patient had post intubation hypotension that responded to Neo-Synephrine 40 mcg and 2 L normal saline bolus.  Hypotension lasted about 20 minutes but patient stabilized after intervention.  Concern for possible subclinical seizures versus polypharmacy.  Patient with CT  head, CT neck, CBC, Tylenol level, ethanol level, salicylate level, urine drug screen, EKG ordered.  Bedside chest x-ray shows no pneumothorax, no widened mediastinum.  Patient with possible developing infiltrate likely from aspiration.  Endotracheal tube in good position.  Patient with no acute intracranial findings on head CT and neck CT without any signs of acute fracture or malalignment.  Patient with urine drug screen positive for cocaine but otherwise unremarkable.  Patient with normal salicylate level.  Alcohol level slightly elevated and Tylenol level also elevated but unknown time of ingestion.  Liver enzymes within normal limits and patient with mild leukocytosis at 12.  Patient also with elevation of lactic acid and bicarb of 18.  Patient with mild elevation in ammonia level at 51.  Patient with non-anion gap metabolic acidosis.  Patient to be admitted to the medical ICU for possible status epilepticus versus overdose with specific concern for beta blocker overdose.  Patient with normal blood pressure throughout my care.  After discussion with ICU will hold  off on any Tylenol overdose treatment given normal LFTs and will repeat in 4 hours.  Likely will need EEG to r/o status, unable to give propofol due to low pressure and will hold off on anti-seizures meds. Also will hold any beta-blocker OD treatment at this time as patient with normal blood pressure. Patient transferred to the ICU team for further care in critical condition.  Patient with altered mental status likely due to multifactorial events, likely polypharm. Given intractable vomiting, likely from unknown ingestion. I discussed this patient w/ Dr. Reather Converse who supervised the care of this patient.  Final Clinical Impressions(s) / ED Diagnoses   Final diagnoses:  Altered mental status, unspecified altered mental status type  Acute respiratory failure, unspecified whether with hypoxia or hypercapnia ([place])    New Prescriptions There are no discharge medications for this patient.    Lennice Sites, DO 06/08/16 0156    Elnora Morrison, MD 06/08/16 [phone]

## 2016-06-07 NOTE — ED Triage Notes (Addendum)
Pt coming from home. Pt found prone on the floor by his roommate. Pt had vomited all over his clothing. Pt responsive to extreme sternum rub. FD arrived on scene first and rolled the pt over. Pt had shallow respirations upon EMS arrival. Ventilations assisted with BVM and NPA. Pt brady in the 40s and was being paced enroute at 65. EMS administered 200cc bolus. Initial BP was 70/50 and last BP was 92/50, 60 HR, 14R, 99% on NRB. Pupils 5 and reactive. Roommate reported to EMS possible seizure like activity. None witnessed by EMS or staff while en route or upon arrival. 4mg  Narcan was administered and pt became slightly aroused and then vomited uncontrollably. Pt is a daily drinker.

## 2016-06-07 NOTE — ED Notes (Signed)
HFP not needed,  Would be a duplicate of CMP.  Per ICU doc since it's a duplicate we don't need it.

## 2016-06-07 NOTE — H&P (Addendum)
PULMONARY / CRITICAL CARE MEDICINE   Name: Leonard Guerrero MRN: [id number] DOB: 09-01-1975    ADMISSION DATE:  06/07/2016 CHIEF COMPLAINT:  Found down  HISTORY OF PRESENT ILLNESS:   41 yo male with h/o seizure disorder who was found down today by roommate (not present in ED).  Roommate reportedly told EMS he had seen the patient convulsing today when he found the patient.  Currently the  patient is intubated and no other persons at bedside.  Attempted to call emergency contact but went strait to voicemail. HPI is taken from EMS, ED staff.    EMS arrival Narcan was administered without response, BG on scene was normal.  The patient is known to take Lamictal, gabapentin and propranolol.  Numerous pill bottles found in the home, some empty, some full.  In the ED patient was regaining consciousness spontaneously but than had intractable vomiting and was intubated for airway protection.  PAST MEDICAL HISTORY :   has no past medical history on file.  has no past surgical history on file. Prior to Admission medications   Not on File   Allergies not on file  FAMILY HISTORY:  has no family status information on file.   SOCIAL HISTORY:    REVIEW OF SYSTEMS:  Unable to obtain  SUBJECTIVE:   VITAL SIGNS: Temp:  [97.5 F (36.4 C)] 97.5 F (36.4 C) (05/04 2237) Pulse Rate:  [42-59] 51 (05/04 2300) Resp:  [16-23] 16 (05/04 2300) BP: (66-144)/(40-107) 105/79 (05/04 2300) SpO2:  [95 %-100 %] 100 % (05/04 2300) FiO2 (%):  [100 %] 100 % (05/04 2144) HEMODYNAMICS:   VENTILATOR SETTINGS: Vent Mode: PRVC FiO2 (%):  [100 %] 100 % Set Rate:  [16 bmp] 16 bmp Vt Set:  [621 mL] 620 mL PEEP:  [5 cmH20] 5 cmH20 Plateau Pressure:  [20 cmH20] 20 cmH20 INTAKE / OUTPUT: No intake or output data in the 24 hours ending 06/07/16 2316  PHYSICAL EXAMINATION: General:  Non responsive, unkempt, dirty Neuro:  Non responsive, GCS 3, no withdrawal to pain. + Twitching noted of the lips and mandible HEENT:   Cervical collar in place.  Pupils 38m and reactive bilaterally Cardiovascular:  Bradycardic, no m/r/g Lungs:  Mechanical breath sounds bilaterally Abdomen:  Soft, normal bowel sounds Musculoskeletal:  Normal bulk and tone Skin:  Numerous tatoos and old, healing abrasion on the left anterior tibia  LABS:  CBC  Recent Labs Lab 06/07/16 2149 06/07/16 2214  WBC 12.0*  --   HGB 12.9* 13.3  HCT 38.1* 39.0  PLT 174  --    Coag's No results for input(s): APTT, INR in the last 168 hours. BMET  Recent Labs Lab 06/07/16 2214  NA 140  K 4.4  CL 109  BUN 9  CREATININE 1.20  GLUCOSE 129*   Electrolytes No results for input(s): CALCIUM, MG, PHOS in the last 168 hours. Sepsis Markers  Recent Labs Lab 06/07/16 2215  LATICACIDVEN 2.92*   ABG  Recent Labs Lab 06/07/16 2237  PHART 7.255*  PCO2ART 39.2  PO2ART 242.0*   Liver Enzymes No results for input(s): AST, ALT, ALKPHOS, BILITOT, ALBUMIN in the last 168 hours. Cardiac Enzymes No results for input(s): TROPONINI, PROBNP in the last 168 hours. Glucose No results for input(s): GLUCAP in the last 168 hours.  Imaging Ct Head Wo Contrast  Result Date: 06/07/2016 CLINICAL DATA:  Possible overdose EXAM: CT HEAD WITHOUT CONTRAST CT CERVICAL SPINE WITHOUT CONTRAST TECHNIQUE: Multidetector CT imaging of the head and cervical spine was performed  following the standard protocol without intravenous contrast. Multiplanar CT image reconstructions of the cervical spine were also generated. COMPARISON:  None. FINDINGS: CT HEAD FINDINGS BRAIN: The ventricles and sulci are normal. No intraparenchymal hemorrhage, mass effect nor midline shift. No acute large vascular territory infarcts. No abnormal extra-axial fluid collections. Basal cisterns are midline and not effaced. No acute cerebellar abnormality. VASCULAR: Unremarkable. SKULL/SOFT TISSUES: No skull fracture. No significant soft tissue swelling. ORBITS/SINUSES: The included ocular  globes and orbital contents are normal.Moderate ethmoid and mild to moderate frontal sinus mucosal thickening. OTHER: None. CT CERVICAL SPINE FINDINGS ALIGNMENT: Vertebral bodies in alignment. Maintained lordosis. SKULL BASE AND VERTEBRAE: Cervical vertebral bodies and posterior elements are intact. Intervertebral disc heights preserved. No destructive bony lesions. C1-2 articulation maintained. SOFT TISSUES AND SPINAL CANAL: Normal. DISC LEVELS: No significant osseous canal stenosis or neural foraminal narrowing. UPPER CHEST: Lung apices are clear. OTHER: Endotracheal tube and gastric tubes are partially imaged along the course of the upper trachea and visualized esophagus respectively. IMPRESSION: 1. No acute intracranial abnormality. The visualized paranasal sinuses are notable for mild-to-moderate mucosal thickening of the ethmoid and frontal sinuses. 2. No acute posttraumatic cervical spine fracture or subluxation. Electronically Signed   By: Ashley Royalty M.D.   On: 06/07/2016 22:40   Ct Cervical Spine Wo Contrast  Result Date: 06/07/2016 CLINICAL DATA:  Possible overdose EXAM: CT HEAD WITHOUT CONTRAST CT CERVICAL SPINE WITHOUT CONTRAST TECHNIQUE: Multidetector CT imaging of the head and cervical spine was performed following the standard protocol without intravenous contrast. Multiplanar CT image reconstructions of the cervical spine were also generated. COMPARISON:  None. FINDINGS: CT HEAD FINDINGS BRAIN: The ventricles and sulci are normal. No intraparenchymal hemorrhage, mass effect nor midline shift. No acute large vascular territory infarcts. No abnormal extra-axial fluid collections. Basal cisterns are midline and not effaced. No acute cerebellar abnormality. VASCULAR: Unremarkable. SKULL/SOFT TISSUES: No skull fracture. No significant soft tissue swelling. ORBITS/SINUSES: The included ocular globes and orbital contents are normal.Moderate ethmoid and mild to moderate frontal sinus mucosal thickening.  OTHER: None. CT CERVICAL SPINE FINDINGS ALIGNMENT: Vertebral bodies in alignment. Maintained lordosis. SKULL BASE AND VERTEBRAE: Cervical vertebral bodies and posterior elements are intact. Intervertebral disc heights preserved. No destructive bony lesions. C1-2 articulation maintained. SOFT TISSUES AND SPINAL CANAL: Normal. DISC LEVELS: No significant osseous canal stenosis or neural foraminal narrowing. UPPER CHEST: Lung apices are clear. OTHER: Endotracheal tube and gastric tubes are partially imaged along the course of the upper trachea and visualized esophagus respectively. IMPRESSION: 1. No acute intracranial abnormality. The visualized paranasal sinuses are notable for mild-to-moderate mucosal thickening of the ethmoid and frontal sinuses. 2. No acute posttraumatic cervical spine fracture or subluxation. Electronically Signed   By: Ashley Royalty M.D.   On: 06/07/2016 22:40   Dg Chest Portable 1 View  Result Date: 06/07/2016 CLINICAL DATA:  Drug overdose.  Post intubation. EXAM: PORTABLE CHEST 1 VIEW COMPARISON:  None. FINDINGS: An ET tube is identified, terminating 6 cm above the carina in good position. The distal NG tube is not well seen. No pneumothorax. The right lung is clear. Haziness projects over the left lung. No nodule or mass. Cardiomegaly. The hila and mediastinum are unremarkable. IMPRESSION: 1. The ETT is in good position. The distal NG tube is not well visualized. 2. Haziness over the left chest could be due to patient rotation. Asymmetric edema or less likely a developing infiltrate is not completely excluded. Electronically Signed   By: Dorise Bullion III M.D  On: 06/07/2016 22:12     ASSESSMENT / PLAN: 41 yo CM with reported history of seizure disorder, found down today after possible seizure and now with post ictal phase compounded by etomidate administration.  PULMONARY A: Intubated for airway protection P:   - CXR - ETT in appropriate location - ABG with uncompensated  metabolic acidosis and superoxia - PRVC 8cc/kg, PEEP 5, RR16, FiO2 40% - SBT tomorrow - VAP prevention bundle  CARDIOVASCULAR  A:  Sinus bradycardia Lactatemia without shock P:  - Patient taking propranolol - no signs of hemodynamic instability - Hold propranolol for tonight - Check troponins - Trend lactate levels - Check lipase - EKG - with sinus brady and questionable, diffuse ST segment elevations..though these are diffuse and without reciprical changes. - Repeat EKG - ICU Tele  RENAL A:   NAGMA P:   - Repeat BMP in AM - Insert foley catheter  GASTROINTESTINAL A:   Vomiting Elevated tylenol level without transamonitis Elevated EtOH level - without hepatitis  P:   - PPI for GI PPX - Unclear timeline of APAP ingestion.  Holding NAC for now as he has not transamonitis and there is no clear ingestion time. - Zofran for nausea - Repeat APAP level in 4 hours - Folate, thiamine and multivitamin bag ordered  HEMATOLOGIC A:   Leukocytosis P:  - Trend WBC - No obvious source of infection - 1/4 sepsis criteria met  INFECTIOUS A:   No obvious sepsis or source P:   BCx2 06/08/2016 UA 06/08/2016 Sputum non Abx: Holding for now  ENDOCRINE A:   Not active    P:   - Maintain BG <180  MSK A:  Cervical collar  P:  - CT neck negative for acute fracture - Given AMS will not clear collar tonight.   NEUROLOGIC A:   Acute encephalopathy Seizure d/o with possible tonic-clonic activity  Concern for subclinical status P:   RASS goal: 0 - EEG tomorrow - propofol gtt tonight to protect against possible subclinical status - Hold Lamictal tonight - unclear if there was an overdose - awaiting pharmacy reconciliation - Holding gabapentin tonight - CT head negative   DVT PPX: Lovenox FEN: Tube feeds tomorrow Code status: Full  FAMILY  - Updates: Called emergency contact number. No Answer  Total critical care time: 30 min  Critical care time was exclusive  of separately billable procedures and treating other patients.  Critical care was necessary to treat or prevent imminent or life-threatening deterioration.  Critical care was time spent personally by me on the following activities: development of treatment plan with patient and/or surrogate as well as nursing, discussions with consultants, evaluation of patient's response to treatment, examination of patient, obtaining history from patient or surrogate, ordering and performing treatments and interventions, ordering and review of laboratory studies, ordering and review of radiographic studies, pulse oximetry and re-evaluation of patient's condition.   Meribeth Mattes, DO., MS [facility]   Pulmonary and [place] Pager: [phone]  06/07/2016, 11:16 PM

## 2016-06-08 ENCOUNTER — Inpatient Hospital Stay (HOSPITAL_COMMUNITY): Payer: Medicaid Other

## 2016-06-08 DIAGNOSIS — R4182 Altered mental status, unspecified: Secondary | ICD-10-CM

## 2016-06-08 DIAGNOSIS — G9340 Encephalopathy, unspecified: Secondary | ICD-10-CM | POA: Diagnosis present

## 2016-06-08 DIAGNOSIS — J9600 Acute respiratory failure, unspecified whether with hypoxia or hypercapnia: Secondary | ICD-10-CM

## 2016-06-08 LAB — URINALYSIS, ROUTINE W REFLEX MICROSCOPIC
Bacteria, UA: NONE SEEN
Glucose, UA: NEGATIVE mg/dL
HGB URINE DIPSTICK: NEGATIVE
Ketones, ur: 5 mg/dL — AB
LEUKOCYTES UA: NEGATIVE
Nitrite: NEGATIVE
Protein, ur: 30 mg/dL — AB
SPECIFIC GRAVITY, URINE: 1.025 (ref 1.005–1.030)
SQUAMOUS EPITHELIAL / LPF: NONE SEEN
pH: 6 (ref 5.0–8.0)

## 2016-06-08 LAB — CK TOTAL AND CKMB (NOT AT ARMC)
CK TOTAL: 131 U/L (ref 49–397)
CK, MB: 3.1 ng/mL (ref 0.5–5.0)
Relative Index: 2.4 (ref 0.0–2.5)

## 2016-06-08 LAB — CBC
HCT: 37.8 % — ABNORMAL LOW (ref 39.0–52.0)
HCT: 39.3 % (ref 39.0–52.0)
HEMOGLOBIN: 12.9 g/dL — AB (ref 13.0–17.0)
Hemoglobin: 12.5 g/dL — ABNORMAL LOW (ref 13.0–17.0)
MCH: 31.4 pg (ref 26.0–34.0)
MCH: 31.3 pg (ref 26.0–34.0)
MCHC: 33.1 g/dL (ref 30.0–36.0)
MCHC: 32.8 g/dL (ref 30.0–36.0)
MCV: 95 fL (ref 78.0–100.0)
MCV: 95.4 fL (ref 78.0–100.0)
PLATELETS: 134 10*3/uL — AB (ref 150–400)
PLATELETS: 131 10*3/uL — AB (ref 150–400)
RBC: 3.98 MIL/uL — ABNORMAL LOW (ref 4.22–5.81)
RBC: 4.12 MIL/uL — AB (ref 4.22–5.81)
RDW: 15 % (ref 11.5–15.5)
RDW: 15.2 % (ref 11.5–15.5)
WBC: 11.6 10*3/uL — AB (ref 4.0–10.5)
WBC: 12.4 10*3/uL — ABNORMAL HIGH (ref 4.0–10.5)

## 2016-06-08 LAB — BASIC METABOLIC PANEL
Anion gap: 10 (ref 5–15)
BUN: 11 mg/dL (ref 6–20)
CHLORIDE: 111 mmol/L (ref 101–111)
CO2: 19 mmol/L — ABNORMAL LOW (ref 22–32)
Calcium: 8.8 mg/dL — ABNORMAL LOW (ref 8.9–10.3)
Creatinine, Ser: 1.09 mg/dL (ref 0.61–1.24)
Glucose, Bld: 65 mg/dL (ref 65–99)
POTASSIUM: 4 mmol/L (ref 3.5–5.1)
SODIUM: 140 mmol/L (ref 135–145)

## 2016-06-08 LAB — COMPREHENSIVE METABOLIC PANEL
ALK PHOS: 53 U/L (ref 38–126)
ALT: 26 U/L (ref 17–63)
ANION GAP: 9 (ref 5–15)
AST: 38 U/L (ref 15–41)
Albumin: 3.2 g/dL — ABNORMAL LOW (ref 3.5–5.0)
BUN: 9 mg/dL (ref 6–20)
CALCIUM: 8.4 mg/dL — AB (ref 8.9–10.3)
CO2: 20 mmol/L — ABNORMAL LOW (ref 22–32)
Chloride: 110 mmol/L (ref 101–111)
Creatinine, Ser: 0.96 mg/dL (ref 0.61–1.24)
Glucose, Bld: 187 mg/dL — ABNORMAL HIGH (ref 65–99)
Potassium: 3.7 mmol/L (ref 3.5–5.1)
Sodium: 139 mmol/L (ref 135–145)
TOTAL PROTEIN: 5.4 g/dL — AB (ref 6.5–8.1)
Total Bilirubin: 1.2 mg/dL (ref 0.3–1.2)

## 2016-06-08 LAB — PROTIME-INR
INR: 1.16
PROTHROMBIN TIME: 14.9 s (ref 11.4–15.2)

## 2016-06-08 LAB — RAPID URINE DRUG SCREEN, HOSP PERFORMED
AMPHETAMINES: NOT DETECTED
BARBITURATES: NOT DETECTED
Benzodiazepines: NOT DETECTED
Cocaine: POSITIVE — AB
Opiates: NOT DETECTED
Tetrahydrocannabinol: NOT DETECTED

## 2016-06-08 LAB — CREATININE, SERUM
CREATININE: 1 mg/dL (ref 0.61–1.24)
GFR calc non Af Amer: >60 mL/min (ref >60)

## 2016-06-08 LAB — GLUCOSE, CAPILLARY
GLUCOSE-CAPILLARY: 83 mg/dL (ref 65–99)
Glucose-Capillary: 100 mg/dL — ABNORMAL HIGH (ref 65–99)
Glucose-Capillary: 130 mg/dL — ABNORMAL HIGH (ref 65–99)
Glucose-Capillary: 62 mg/dL — ABNORMAL LOW (ref 65–99)

## 2016-06-08 LAB — HEPATIC FUNCTION PANEL
ALBUMIN: 3.3 g/dL — AB (ref 3.5–5.0)
ALT: 26 U/L (ref 17–63)
AST: 36 U/L (ref 15–41)
Alkaline Phosphatase: 55 U/L (ref 38–126)
BILIRUBIN INDIRECT: 1 mg/dL — AB (ref 0.3–0.9)
BILIRUBIN TOTAL: 1.3 mg/dL — AB (ref 0.3–1.2)
Bilirubin, Direct: 0.3 mg/dL (ref 0.1–0.5)
TOTAL PROTEIN: 5.5 g/dL — AB (ref 6.5–8.1)

## 2016-06-08 LAB — TROPONIN I
Troponin I: <0.03 ng/mL (ref <0.03)
Troponin I: <0.03 ng/mL (ref <0.03)

## 2016-06-08 LAB — ACETAMINOPHEN LEVEL
ACETAMINOPHEN (TYLENOL), SERUM: 22 ug/mL (ref 10–30)
Acetaminophen (Tylenol), Serum: <10 ug/mL — ABNORMAL LOW (ref 10–30)

## 2016-06-08 LAB — MAGNESIUM: MAGNESIUM: 1.9 mg/dL (ref 1.7–2.4)

## 2016-06-08 LAB — TRIGLYCERIDES: Triglycerides: 74 mg/dL (ref <150)

## 2016-06-08 LAB — MRSA PCR SCREENING: MRSA by PCR: NEGATIVE

## 2016-06-08 LAB — LACTIC ACID, PLASMA
LACTIC ACID, VENOUS: 1.7 mmol/L (ref 0.5–1.9)
LACTIC ACID, VENOUS: 1.9 mmol/L (ref 0.5–1.9)

## 2016-06-08 LAB — HIV ANTIBODY (ROUTINE TESTING W REFLEX): HIV Screen 4th Generation wRfx: NONREACTIVE

## 2016-06-08 LAB — LIPASE, BLOOD: Lipase: 25 U/L (ref 11–51)

## 2016-06-08 LAB — PHOSPHORUS: PHOSPHORUS: 3.3 mg/dL (ref 2.5–4.6)

## 2016-06-08 MED ORDER — FENTANYL BOLUS VIA INFUSION: 25.0000 ug | INTRAVENOUS | Status: DC | PRN

## 2016-06-08 MED ORDER — ADULT MULTIVITAMIN LIQUID CH
15.0000 mL | Freq: Every day | ORAL | Status: DC
  Filled 2016-06-08 (×2): qty 15

## 2016-06-08 MED ORDER — NALOXONE HCL 2 MG/2ML IJ SOSY
2.0000 mg | PREFILLED_SYRINGE | Freq: Once | INTRAMUSCULAR | Status: AC
  Administered 2016-06-07: 2 mg via INTRAVENOUS

## 2016-06-08 MED ORDER — SODIUM CHLORIDE 0.9 % IV SOLN
500.0000 mg | Freq: Two times a day (BID) | INTRAVENOUS | Status: DC
  Administered 2016-06-08 – 2016-06-09 (×2): 500 mg via INTRAVENOUS
  Filled 2016-06-08 (×3): qty 5

## 2016-06-08 MED ORDER — PROPOFOL 1000 MG/100ML IV EMUL
5.0000 ug/kg/min | INTRAVENOUS | Status: DC
  Administered 2016-06-08: 5 ug/kg/min via INTRAVENOUS
  Administered 2016-06-08: 30 ug/kg/min via INTRAVENOUS
  Administered 2016-06-08: 20 ug/kg/min via INTRAVENOUS
  Administered 2016-06-09: 30 ug/kg/min via INTRAVENOUS
  Filled 2016-06-08 (×3): qty 100

## 2016-06-08 MED ORDER — ETOMIDATE 2 MG/ML IV SOLN
INTRAVENOUS | Status: AC | PRN
  Administered 2016-06-07: 20 mg via INTRAVENOUS

## 2016-06-08 MED ORDER — PROPOFOL 1000 MG/100ML IV EMUL
5.0000 ug/kg/min | INTRAVENOUS | Status: DC
  Administered 2016-06-08: 5 ug/kg/min via INTRAVENOUS

## 2016-06-08 MED ORDER — CALCIUM GLUCONATE 10 % IV SOLN
1.0000 g | Freq: Once | INTRAVENOUS | Status: AC
  Administered 2016-06-08: 1 g via INTRAVENOUS
  Filled 2016-06-08: qty 10

## 2016-06-08 MED ORDER — MIDAZOLAM HCL 2 MG/2ML IJ SOLN
4.0000 mg | Freq: Once | INTRAMUSCULAR | Status: AC
  Administered 2016-06-08: 4 mg via INTRAVENOUS

## 2016-06-08 MED ORDER — DEXTROSE 50 % IV SOLN
25.0000 mL | Freq: Once | INTRAVENOUS | Status: AC
  Administered 2016-06-08: 25 mL via INTRAVENOUS
  Filled 2016-06-08: qty 50

## 2016-06-08 MED ORDER — LEVETIRACETAM 500 MG/5ML IV SOLN
1000.0000 mg | Freq: Once | INTRAVENOUS | Status: AC
  Administered 2016-06-08: 1000 mg via INTRAVENOUS
  Filled 2016-06-08: qty 10

## 2016-06-08 MED ORDER — FENTANYL 2500MCG IN NS 250ML (10MCG/ML) PREMIX INFUSION: 25.0000 ug/h | INTRAVENOUS | Status: DC

## 2016-06-08 MED ORDER — PROPOFOL 1000 MG/100ML IV EMUL
INTRAVENOUS | Status: AC
  Filled 2016-06-08: qty 100

## 2016-06-08 MED ORDER — CHLORHEXIDINE GLUCONATE 0.12% ORAL RINSE (MEDLINE KIT)
15.0000 mL | Freq: Two times a day (BID) | OROMUCOSAL | Status: DC
  Administered 2016-06-08 – 2016-06-09 (×4): 15 mL via OROMUCOSAL

## 2016-06-08 MED ORDER — MAGNESIUM SULFATE 2 GM/50ML IV SOLN
2.0000 g | Freq: Once | INTRAVENOUS | Status: AC
  Administered 2016-06-08: 2 g via INTRAVENOUS
  Filled 2016-06-08: qty 50

## 2016-06-08 MED ORDER — MIDAZOLAM HCL 2 MG/2ML IJ SOLN
INTRAMUSCULAR | Status: AC
  Administered 2016-06-08: 4 mg via INTRAVENOUS
  Filled 2016-06-08: qty 4

## 2016-06-08 MED ORDER — LORAZEPAM 2 MG/ML IJ SOLN
1.0000 mg | INTRAMUSCULAR | Status: DC | PRN
  Filled 2016-06-08: qty 1

## 2016-06-08 MED ORDER — ORAL CARE MOUTH RINSE
15.0000 mL | Freq: Four times a day (QID) | OROMUCOSAL | Status: DC
  Administered 2016-06-08 – 2016-06-10 (×6): 15 mL via OROMUCOSAL

## 2016-06-08 MED ORDER — SODIUM CHLORIDE 0.9 % IV BOLUS (SEPSIS)
500.0000 mL | Freq: Once | INTRAVENOUS | Status: AC
  Administered 2016-06-08: 500 mL via INTRAVENOUS

## 2016-06-08 MED ORDER — ROCURONIUM BROMIDE 50 MG/5ML IV SOLN
INTRAVENOUS | Status: AC | PRN
  Administered 2016-06-07: 20 mg via INTRAVENOUS

## 2016-06-08 MED ORDER — ADULT MULTIVITAMIN LIQUID CH
15.0000 mL | Freq: Every day | ORAL | Status: DC
  Filled 2016-06-08: qty 15

## 2016-06-08 MED ORDER — LORAZEPAM 2 MG/ML IJ SOLN
1.0000 mg | Freq: Four times a day (QID) | INTRAMUSCULAR | Status: DC
  Administered 2016-06-08 – 2016-06-10 (×6): 1 mg via INTRAVENOUS
  Filled 2016-06-08 (×6): qty 1

## 2016-06-08 MED ORDER — MIDAZOLAM HCL 2 MG/2ML IJ SOLN
2.0000 mg | INTRAMUSCULAR | Status: DC | PRN
  Administered 2016-06-08 – 2016-06-09 (×2): 2 mg via INTRAVENOUS
  Filled 2016-06-08: qty 2

## 2016-06-08 MED ORDER — MIDAZOLAM HCL 2 MG/2ML IJ SOLN: 2.0000 mg | INTRAMUSCULAR | Status: DC | PRN

## 2016-06-08 MED ORDER — VITAL HIGH PROTEIN PO LIQD: 1000.0000 mL | ORAL | Status: DC

## 2016-06-08 MED ORDER — VITAL AF 1.2 CAL PO LIQD: 1000.0000 mL | ORAL | Status: DC

## 2016-06-08 MED ORDER — ACTIDOSE WITH SORBITOL 50 GM/240ML PO LIQD
50.0000 g | Freq: Once | ORAL | Status: DC
  Filled 2016-06-08: qty 240

## 2016-06-08 MED ORDER — PHENYLEPHRINE HCL 10 MG/ML IJ SOLN
INTRAMUSCULAR | Status: AC | PRN
  Administered 2016-06-07: 40 ug via INTRAVENOUS

## 2016-06-08 MED ORDER — DEXTROSE 50 % IV SOLN
INTRAVENOUS | Status: AC
  Filled 2016-06-08: qty 50

## 2016-06-08 MED ORDER — DEXTROSE-NACL 5-0.45 % IV SOLN
INTRAVENOUS | Status: DC
  Administered 2016-06-08: 1000 mL via INTRAVENOUS
  Administered 2016-06-09 (×2): via INTRAVENOUS
  Administered 2016-06-10: 1000 mL via INTRAVENOUS

## 2016-06-08 MED ORDER — GLUCAGON HCL RDNA (DIAGNOSTIC) 1 MG IJ SOLR
3.0000 mg/h | INTRAVENOUS | Status: DC
  Administered 2016-06-08 (×3): 3 mg/h via INTRAVENOUS
  Filled 2016-06-08 (×4): qty 5

## 2016-06-08 MED ORDER — MIDAZOLAM HCL 2 MG/2ML IJ SOLN
INTRAMUSCULAR | Status: AC
  Filled 2016-06-08: qty 2

## 2016-06-08 NOTE — Progress Notes (Signed)
   [facility]  > mother and ex wife updated at bedside.  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  Ex wife's father is admitted recently at [facility] for [facility] and pt is probably stressed out and started drinking recently. Ex wife last saw him on Tuesday and he appeared fine. Possible depression and psych issues.  > HR 47 BP 90/70  >> cutting down on propofol. Scheduled ativan. If agitation becomes an issue, may need versed pushes or restart propofol. Echo is pending > needs sitter when he wakes up > SI pls.   Pollie MeyerJ. Angelo A de Dios, MD 06/08/2016, 1:04 PM  Pulmonary and Critical Care Pager [phone] After 3 pm or if no answer, call [phone]

## 2016-06-08 NOTE — Sedation Documentation (Signed)
ED Provider at bedside. 

## 2016-06-08 NOTE — Progress Notes (Signed)
eLink Physician-Brief Progress Note Patient Name: Zeb ComfortRicky Sarratt DOB: [birth year] MRN: [id number]   Date of Service  06/08/2016  HPI/Events of Note  Bedside nurse reporting questionable seizure activity with tongue rolling, etc. Concern for possible underlying status epilepticus. Fluctuating blood pressure.   eICU Interventions  1. Starting propofol infusion 2. Seizure precautions ordered 3. Ativan ordered IV as needed for seizures 4. Changing EEG to stat & have paged neurologist on call to make aware of this request     Intervention Category Major Interventions: Change in mental status - evaluation and management;Seizures - evaluation and management  Lawanda CousinsJennings Devany Aja 06/08/2016, 12:10 AM

## 2016-06-08 NOTE — Progress Notes (Signed)
Neurology Note  Patient seen at 0310 today by Dr. Otelia LimesLindzen. I have reviewed the chart at length and also discussed the case with Dr. Otelia LimesLindzen at change of shift.   In brief, this is a 40-yo man who was brought to the ED after being found unresponsive at home by his roommate. EMS noted him to be bradycardic and hypotensive with GCS 8-10. Initial blood glucose was normal. In the ED he was noted to open his eyes to command, localize to pain, and move all four extremities. He has a history of seizures and may have had some "seizure-like activity" at home yesterday but none was seen in the ED. His mental status seemed to be improving but he then had an episode of emesis and was intubated for airway protection. CTH was negative. He was found to have cocaine on his utox with serum EtOH level 14. Serum APAP level was also elevated. He was admitted to the ICU with concern for possible OD on his meds (propranolol, lamotrigine, gabapentin) vs status epilepticus.   Dr. Otelia LimesLindzen examined the patient and noted him to be intubated and obtunded, withdrawing all four limbes non-purposefully from pain with no lateralizing signs and intact brainstem reflexes. STAT EEG was obtained and did not show seizure. He loaded the patient with Keppra and recommended continuing Keppra until he is able to take oral lamotrigine again. This morning the patient remains intubated and sedated.   On my exam, he does not open his eyes to voice or noxious stimuli. He is intubated on propofol. He does not follow commands. Pupils are equal and reactive from 3-->2 mm. Eyes are mildly dysconjugate but no deviation. Corneals are intact. Face is symmetric. He has some spontaneous movement of BUE. He withdrew from noxious stimuli in the arms, not the legs. DTRs hypoactive t/o.   I read his EEG earlier. This showed moderate diffuse slowing without epileptiform activity or seizures.   [facility]. This  is unremarkable.   Impression/Recs: 1. Acute encephalopathy: Main concern is for possible OD on his home meds (lamotrigine, propranolol, gabapentin, APAP) vs intoxication (positive for EtOH and cocaine) vs unwitnessed seizure with postictal state or some combo of the above. EEG negative for ongoing seizure. No meningismus or fever to suggest CNS infection. CTH shows no obvious pathology. Continue supportive care as you are. Agree with Keppra for now.   Will follow, please call if any questions or concerns.

## 2016-06-08 NOTE — ED Notes (Signed)
Unable to administer Ativan or prop at this current time due to pt systolic BP in the lower 90s.

## 2016-06-08 NOTE — Progress Notes (Signed)
PULMONARY / CRITICAL CARE MEDICINE   Name: Leonard Guerrero MRN: [id number] DOB: Oct 22, 1975    ADMISSION DATE:  06/07/2016 CHIEF COMPLAINT:  Found down  HISTORY OF PRESENT ILLNESS:   41 yo male with h/o seizure disorder who was found down today by roommate (not present in ED).  Roommate reportedly told EMS he had seen the patient convulsing today when he found the patient.  Currently the  patient is intubated and no other persons at bedside.  Attempted to call emergency contact but went strait to voicemail. HPI is taken from EMS, ED staff.    EMS arrival Narcan was administered without response, BG on scene was normal.  The patient is known to take Lamictal, gabapentin and propranolol.  Numerous pill bottles found in the home, some empty, some full.  In the ED patient was regaining consciousness spontaneously but than had intractable vomiting and was intubated for airway protection.   SUBJECTIVE:  Sedated on the vent BP lower with propofol drip   VITAL SIGNS: Temp:  [97.5 F (36.4 C)-98 F (36.7 C)] 98 F (36.7 C) (05/05 0740) Pulse Rate:  [31-95] 44 (05/05 0900) Resp:  [13-33] 16 (05/05 0900) BP: (66-153)/(40-107) 112/76 (05/05 0900) SpO2:  [94 %-100 %] 100 % (05/05 0927) FiO2 (%):  [40 %-100 %] 40 % (05/05 0927) Weight:  [88.6 kg (195 lb 5.2 oz)-90.7 kg (200 lb)] 88.6 kg (195 lb 5.2 oz) (05/05 0200) HEMODYNAMICS:   VENTILATOR SETTINGS: Vent Mode: PRVC FiO2 (%):  [40 %-100 %] 40 % Set Rate:  [16 bmp] 16 bmp Vt Set:  [620 mL] 620 mL PEEP:  [5 cmH20] 5 cmH20 Plateau Pressure:  [15 cmH20-25 cmH20] 15 cmH20 INTAKE / OUTPUT:  Intake/Output Summary (Last 24 hours) at 06/08/16 0947 Last data filed at 06/08/16 0900  Gross per 24 hour  Intake          3299.88 ml  Output             1200 ml  Net          2099.88 ml    PHYSICAL EXAMINATION: General:  Sedated. Unkempt.  Neuro:  CN grossly intact. (-) lateralizing signs HEENT:  Cervical collar in place.  Pupils 5mm and reactive  bilaterally Cardiovascular:  Bradycardic, no m/r/g Lungs:  Mechanical breath sounds bilaterally. cta Abdomen:  Soft, normal bowel sounds Musculoskeletal:  Normal bulk and tone Skin:  Numerous tatoos and old, healing abrasion on the left anterior tibia  LABS:  CBC  Recent Labs Lab 06/07/16 2149 06/07/16 2214 06/08/16 0230 06/08/16 0559  WBC 12.0*  --  11.6* 12.4*  HGB 12.9* 13.3 12.5* 12.9*  HCT 38.1* 39.0 37.8* 39.3  PLT 174  --  134* 131*   Coag's  Recent Labs Lab 06/08/16 0230  INR 1.16   BMET  Recent Labs Lab 06/07/16 2149 06/07/16 2214 06/08/16 0230 06/08/16 0559  NA 137 140  --  139  K 4.2 4.4  --  3.7  CL 106 109  --  110  CO2 18*  --   --  20*  BUN 8 9  --  9  CREATININE 1.16 1.20 1.00 0.96  GLUCOSE 134* 129*  --  187*   Electrolytes  Recent Labs Lab 06/07/16 2149 06/08/16 0230 06/08/16 0559  CALCIUM 8.5*  --  8.4*  MG  --  1.9  --   PHOS  --  3.3  --    Sepsis Markers  Recent Labs Lab 06/07/16 2215 06/08/16 0230 06/08/16  0559  LATICACIDVEN 2.92* 1.7 1.9   ABG  Recent Labs Lab 06/07/16 2237  PHART 7.255*  PCO2ART 39.2  PO2ART 242.0*   Liver Enzymes  Recent Labs Lab 06/07/16 2149 06/08/16 0559  AST 33 38  ALT 26 26  ALKPHOS 52 53  BILITOT 0.9 1.2  ALBUMIN 3.7 3.2*   Cardiac Enzymes  Recent Labs Lab 06/08/16 0230 06/08/16 0559  TROPONINI <0.03 <0.03   Glucose  Recent Labs Lab 06/08/16 0229  GLUCAP 130*    Imaging Ct Head Wo Contrast  Result Date: 06/07/2016 CLINICAL DATA:  Possible overdose EXAM: CT HEAD WITHOUT CONTRAST CT CERVICAL SPINE WITHOUT CONTRAST TECHNIQUE: Multidetector CT imaging of the head and cervical spine was performed following the standard protocol without intravenous contrast. Multiplanar CT image reconstructions of the cervical spine were also generated. COMPARISON:  None. FINDINGS: CT HEAD FINDINGS BRAIN: The ventricles and sulci are normal. No intraparenchymal hemorrhage, mass effect nor  midline shift. No acute large vascular territory infarcts. No abnormal extra-axial fluid collections. Basal cisterns are midline and not effaced. No acute cerebellar abnormality. VASCULAR: Unremarkable. SKULL/SOFT TISSUES: No skull fracture. No significant soft tissue swelling. ORBITS/SINUSES: The included ocular globes and orbital contents are normal.Moderate ethmoid and mild to moderate frontal sinus mucosal thickening. OTHER: None. CT CERVICAL SPINE FINDINGS ALIGNMENT: Vertebral bodies in alignment. Maintained lordosis. SKULL BASE AND VERTEBRAE: Cervical vertebral bodies and posterior elements are intact. Intervertebral disc heights preserved. No destructive bony lesions. C1-2 articulation maintained. SOFT TISSUES AND SPINAL CANAL: Normal. DISC LEVELS: No significant osseous canal stenosis or neural foraminal narrowing. UPPER CHEST: Lung apices are clear. OTHER: Endotracheal tube and gastric tubes are partially imaged along the course of the upper trachea and visualized esophagus respectively. IMPRESSION: 1. No acute intracranial abnormality. The visualized paranasal sinuses are notable for mild-to-moderate mucosal thickening of the ethmoid and frontal sinuses. 2. No acute posttraumatic cervical spine fracture or subluxation. Electronically Signed   By: Tollie Eth M.D.   On: 06/07/2016 22:40   Ct Cervical Spine Wo Contrast  Result Date: 06/07/2016 CLINICAL DATA:  Possible overdose EXAM: CT HEAD WITHOUT CONTRAST CT CERVICAL SPINE WITHOUT CONTRAST TECHNIQUE: Multidetector CT imaging of the head and cervical spine was performed following the standard protocol without intravenous contrast. Multiplanar CT image reconstructions of the cervical spine were also generated. COMPARISON:  None. FINDINGS: CT HEAD FINDINGS BRAIN: The ventricles and sulci are normal. No intraparenchymal hemorrhage, mass effect nor midline shift. No acute large vascular territory infarcts. No abnormal extra-axial fluid collections. Basal  cisterns are midline and not effaced. No acute cerebellar abnormality. VASCULAR: Unremarkable. SKULL/SOFT TISSUES: No skull fracture. No significant soft tissue swelling. ORBITS/SINUSES: The included ocular globes and orbital contents are normal.Moderate ethmoid and mild to moderate frontal sinus mucosal thickening. OTHER: None. CT CERVICAL SPINE FINDINGS ALIGNMENT: Vertebral bodies in alignment. Maintained lordosis. SKULL BASE AND VERTEBRAE: Cervical vertebral bodies and posterior elements are intact. Intervertebral disc heights preserved. No destructive bony lesions. C1-2 articulation maintained. SOFT TISSUES AND SPINAL CANAL: Normal. DISC LEVELS: No significant osseous canal stenosis or neural foraminal narrowing. UPPER CHEST: Lung apices are clear. OTHER: Endotracheal tube and gastric tubes are partially imaged along the course of the upper trachea and visualized esophagus respectively. IMPRESSION: 1. No acute intracranial abnormality. The visualized paranasal sinuses are notable for mild-to-moderate mucosal thickening of the ethmoid and frontal sinuses. 2. No acute posttraumatic cervical spine fracture or subluxation. Electronically Signed   By: Tollie Eth M.D.   On: 06/07/2016 22:40   Dg  Chest Portable 1 View  Result Date: 06/07/2016 CLINICAL DATA:  Drug overdose.  Post intubation. EXAM: PORTABLE CHEST 1 VIEW COMPARISON:  None. FINDINGS: An ET tube is identified, terminating 6 cm above the carina in good position. The distal NG tube is not well seen. No pneumothorax. The right lung is clear. Haziness projects over the left lung. No nodule or mass. Cardiomegaly. The hila and mediastinum are unremarkable. IMPRESSION: 1. The ETT is in good position. The distal NG tube is not well visualized. 2. Haziness over the left chest could be due to patient rotation. Asymmetric edema or less likely a developing infiltrate is not completely excluded. Electronically Signed   By: Gerome Sam III M.D   On: 06/07/2016  22:12   Dg Abd Portable 1v  Result Date: 06/08/2016 CLINICAL DATA:  OG tube placement vomiting EXAM: PORTABLE ABDOMEN - 1 VIEW COMPARISON:  None. FINDINGS: Left lung base is clear. Esophageal tube tip overlies the proximal stomach, side-port appears to be at the GE junction. IMPRESSION: Esophageal tube tip overlies the proximal stomach, side-port projects over GE junction, further advancement by at least 5 cm is suggested for more optimal positioning. These results will be called to the ordering clinician or representative by the Radiologist Assistant, and communication documented in the PACS or zVision Dashboard. Electronically Signed   By: Jasmine Pang M.D.   On: 06/08/2016 03:22     ASSESSMENT / PLAN: 41 yo CM with reported history of seizure disorder, found down today after possible seizure and now with post ictal phase compounded by etomidate administration. Possible drug overdose.  Also with elevated ETOH level and UDS (+) for cocaine  PULMONARY A: Acute hypoxemic respiratory failure 2/2 unable to protect airway with Polysubstance abuse P:   - keep intubated. Sedated.  - SBT tomorrow - VAP prevention bundle   CARDIOVASCULAR  A:  Sinus bradycardia Lactatemia without shock P:  - Patient taking propranolol - no signs of hemodynamic instability. - Bradycardia now might also be related to propofol as well - no clear history on this pt.  Not sure if he intentionally took more of his home meds (propranolol + lamictal + gabapentin). Cont glucagon drip for now (per poison control) - Hold propranolol for now - troponin rend - Trend lactate levels   RENAL A:   NAGMA P:   - Repeat BMP  - Insert foley catheter - start IVF  - check CKs for possible mild rhabdo   GASTROINTESTINAL A:   Vomiting Elevated tylenol level without transaminitis Elevated EtOH level - without hepatitis  P:   - PPI for GI PPX - Unclear timeline of APAP ingestion.  Holding NAC for now as he has not  transamonitis and there is no clear ingestion time. APAP level is downtrending. Repeat APAP level now.  - Zofran for nausea - Folate, thiamine and multivitamin bag ordered   HEMATOLOGIC A:   Leukocytosis P:  - Trend WBC - No obvious source of infection   INFECTIOUS A:   No obvious sepsis or source P:   Panculture Abx: Holding for now unless he decompensates   ENDOCRINE A:   Not active    P:   - Maintain BG <180  MSK A:  Cervical collar  P:  - CT neck negative for acute fracture - Given AMS will not clear collar tonight.    NEUROLOGIC A:   Acute encephalopathy likely 2/2 ETOH intoxication/ withdrawal. Concern for overdose on his pills (lamictal + propranolol + gabapentin). H/O  sze disorder.  Seizure d/o with possible tonic-clonic activity  Concern for subclinical status P:   RASS goal: 0 EEG was (-) for seizures Cont propofol for now. Try to cut down propofol. Will schedule ativan 1 mg q6 for etoh intoxication.  He has ativan prn.  Cont keppra IV > transition to PO or lamictal when able   DVT PPX: Lovenox FEN: Tube feeds tomorrow Code status: Full  FAMILY  - Updates: Called emergency contact number. No Answer. Awaiting mother to come today.   Total critical care time: 30 min  J. Alexis FrockAngelo A de Dios, MD 06/08/2016, 10:14 AM [facility] Pager [phone] After 3 pm or if no answer, call [phone]

## 2016-06-08 NOTE — Procedures (Signed)
Electroencephalogram (EEG) Report  Date of study: 06/08/16  Requesting clinician: Tera Partridge, MD  Reason for study: Evaluate for seizure  Brief clinical history: This is a 41 year old man admitted after being found down at home. There is some concern about nonconvulsive status epilepticus and EEG is being performed for further evaluation.  Medications:  Current Facility-Administered Medications:  .  0.9 %  sodium chloride infusion, 250 mL, Intravenous, PRN, [place] Nickel, MD, Last Rate: 10 mL/hr at 06/08/16 0600, 250 mL at 06/08/16 0600 .  activated charcoal-sorbitol (ACTIDOSE-SORBITOL) suspension 50 g, 50 g, Per Tube, Once, Nestor, Sonia Baller, MD .  chlorhexidine gluconate (MEDLINE KIT) (PERIDEX) 0.12 % solution 15 mL, 15 mL, Mouth Rinse, BID, Nestor, Sonia Baller, MD .  enoxaparin (LOVENOX) injection 40 mg, 40 mg, Subcutaneous, Q24H, Lo Corine Shelter, MD .  famotidine (PEPCID) 40 MG/5ML suspension 20 mg, 20 mg, Per Tube, BID, Vickii Chafe Corine Shelter, MD .  folic acid (FOLVITE) tablet 1 mg, 1 mg, Oral, Daily, Lo Corine Shelter, MD .  glucagon (human recombinant) (GLUCAGEN) 5 mg in dextrose 5 % 50 mL (0.1 mg/mL) infusion, 3 mg/hr, Intravenous, Continuous, Javier Glazier, MD, Last Rate: 30 mL/hr at 06/08/16 0608, 3 mg/hr at 06/08/16 0608 .  levETIRAcetam (KEPPRA) 500 mg in sodium chloride 0.9 % 100 mL IVPB, 500 mg, Intravenous, Q12H, Kerney Elbe, MD .  LORazepam (ATIVAN) injection 1-2 mg, 1-2 mg, Intravenous, Q15 min PRN, Javier Glazier, MD .  MEDLINE mouth rinse, 15 mL, Mouth Rinse, QID, Nestor, Sonia Baller, MD .  multivitamin with minerals tablet 1 tablet, 1 tablet, Oral, Daily, Lo Corine Shelter, MD .  propofol (DIPRIVAN) 1000 MG/100ML infusion, 5-80 mcg/kg/min, Intravenous, Titrated, Javier Glazier, MD, Last Rate: 6.5 mL/hr at 06/08/16 0655, 12 mcg/kg/min at 06/08/16 0655 .  thiamine (VITAMIN B-1) tablet 100 mg, 100 mg, Oral, Daily, Lo Corine Shelter, MD  Description: This is a  routine EEG performed using standard international 10-20 electrode placement. A total of 18 channels are recorded, including one for the EKG. The patient is presently intubated and sedated in the ICU during this recording.  Activating Maneuvers: None  Findings:  The EKG channel demonstrates a regular rhythm with a rate of 60 beats per minute.   The background demonstrates a moderate degree of diffuse generalized slowing. This consists of a mixture of delta and theta frequencies. Average background frequency is 4-5 Hz. On rare occasions, he reaches a maximal frequency of 7 Hz but this is poorly sustained. This background is symmetric and demonstrates reactivity. Voltages are mildly reduced throughout.   There are no focal asymmetries. No epileptiform discharges are present. No seizures are recorded.    Impression:  This is an abnormal EEG due to the presence of moderate diffuse generalized slowing. There are no episode form discharges or seizures.  Clinical correlation: This EEG is consistent with a global encephalopathic process, nonspecific as to etiology. Potential considerations would include medication effect, metabolic derangements, postictal state, hypoxic-ischemic injury, and underlying neurodegenerative processes. Clinical correlation is recommended. There is no evidence of ongoing seizure activity.   Melba Coon, MD [facility]

## 2016-06-08 NOTE — Sedation Documentation (Signed)
Vital signs stable. 

## 2016-06-08 NOTE — ED Notes (Signed)
OG readjusted to 24 at the lip.

## 2016-06-08 NOTE — Progress Notes (Signed)
STAT EEG Completed; Results Pending   

## 2016-06-08 NOTE — Progress Notes (Signed)
eLink Physician-Brief Progress Note Patient Name: Leonard ComfortRicky Guerrero DOB: 18-Oct-1975 MRN: [id number]   Date of Service  06/08/2016  HPI/Events of Note  Hypoglycemia - Blood glucose = 62.   eICU Interventions  Will order: 1. Increase D5 0.45 NaCl IV infusion to 75 mL/hour.      Intervention Category Major Interventions: Other:  Lenell AntuSommer,Steven Eugene 06/08/2016, 8:32 PM

## 2016-06-08 NOTE — Progress Notes (Signed)
Initial Nutrition Assessment  DOCUMENTATION CODES:   Not applicable  INTERVENTION:  - Will order Vital 1.2 @ 60 mL/hr. This regimen + kcal from current Propofol rate will provide 1897 kcal (96% estimated kcal need), 108 grams of protein, and 1168 mL free water.  - Will change multivitamin pill to 15 mL liquid multivitamin per OGT.  NUTRITION DIAGNOSIS:   Inadequate oral intake related to inability to eat as evidenced by NPO status.  GOAL:   Patient will meet greater than or equal to 90% of their needs  MONITOR:   Vent status, TF tolerance, Weight trends, Labs, I & O's  REASON FOR ASSESSMENT:   Ventilator, Consult Enteral/tube feeding initiation and management  ASSESSMENT:   41 yo male with h/o seizure disorder who was found down today by roommate (not present in ED).  Roommate reportedly told EMS he had seen the patient convulsing today when he found the patient.  Currently the  patient is intubated and no other persons at bedside.  Attempted to call emergency contact but went strait to voicemail. HPI is taken from EMS, ED staff.   Pt seen for new vent and consult. BMI indicates overweight status. Pt with OGT in place. No family/visitors present at this time. Spoke with RN who reports team would like to keep pt NPO today and start TF 5/6 AM; will delay order until that time.   Physical assessment shows no muscle or fat wasting and no edema. Pt with bilateral mitten restraints and bilateral soft ankle restraints. No weight hx available from PTA.  Patient is currently intubated on ventilator support MV: 9.8 L/min Temp (24hrs), Avg:97.8 F (36.6 C), Min:97.5 F (36.4 C), Max:98 F (36.7 C) Propofol: 6.4 ml/hr (169 kcal from fat)  Medications reviewed; 20 mg Pepcid per OGT BID, 1 mg folic acid per OGT/day, daily multivitamin per OGT, 100 mg thiamine per OGT/day.  Labs reviewed; Ca: 8.4 mg/dL.   IVF: D5-1/2 NS @ 50 mL/hr (204 kcal from dextrose). Drips: Propofol @ 12  mcg/kg/min, glucagon @ 3 mg/hr.    Diet Order:   NPO  Skin:  Reviewed, no issues  Last BM:  PTA/unknown  Height:   Ht Readings from Last 1 Encounters:  06/08/16 6' (1.829 m)    Weight:   Wt Readings from Last 1 Encounters:  06/08/16 195 lb 5.2 oz (88.6 kg)    Ideal Body Weight:  80.91 kg  BMI:  Body mass index is 26.49 kg/m.  Estimated Nutritional Needs:   Kcal:  1975  Protein:  106-124 grams (1.2-1.4 grams/kg)  Fluid:  1.8-2 L/day  EDUCATION NEEDS:   No education needs identified at this time    Trenton GammonJessica Elvenia Godden, MS, RD, LDN, CNSC Inpatient Clinical Dietitian Pager # [phone] After hours/weekend pager # [phone]
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
## 2016-06-08 NOTE — ED Notes (Signed)
Pt jeans, Shirt, keys and wallet sent to floor in personal belongings bag. RN made aware. Medications to be inventoried and sent to pharmacy.

## 2016-06-08 NOTE — Sedation Documentation (Signed)
Pt intubated. 22 @ the lip. 7.5 tube inserted. Color changed noted, equal chest rise and fall, and bilateral breath sounds noted.

## 2016-06-08 NOTE — ED Notes (Signed)
Poison control contacted. Per poison control staff needs to watch for QTc elongation if it is >500 milliseconds staff needs to optimize electrolytes on the high end of normal. It is also suggested for Q6 EKGs. Electrolyte monitoring. Watch for CNS depression and seizure activity.  If pt has seizures use benzos first then phenobarb if necessary. If his BPs drop and fluids don't help try norepi and then glucagon,

## 2016-06-08 NOTE — Progress Notes (Signed)
eLink Physician-Brief Progress Note Patient Name: Leonard ComfortRicky Macbride DOB: 05-26-1975 MRN: [id number]   Date of Service  06/08/2016  HPI/Events of Note  Patient admitted with possible overdose.   eICU Interventions  1. Checking them at the level 2. Starting glucagon infusion 3. Calcium gluconate 1 g IV 4. Administering 50 g of activated charcoal with sorbitol via enteric feeding tube      Intervention Category Major Interventions: Other:  Lawanda CousinsJennings Mj Willis 06/08/2016, 2:48 AM

## 2016-06-09 ENCOUNTER — Inpatient Hospital Stay (HOSPITAL_COMMUNITY): Payer: Medicaid Other

## 2016-06-09 DIAGNOSIS — F10920 Alcohol use, unspecified with intoxication, uncomplicated: Secondary | ICD-10-CM

## 2016-06-09 LAB — CBC
HCT: 38.7 % — ABNORMAL LOW (ref 39.0–52.0)
Hemoglobin: 12.8 g/dL — ABNORMAL LOW (ref 13.0–17.0)
MCH: 31.4 pg (ref 26.0–34.0)
MCHC: 33.1 g/dL (ref 30.0–36.0)
MCV: 95.1 fL (ref 78.0–100.0)
PLATELETS: 146 10*3/uL — AB (ref 150–400)
RBC: 4.07 MIL/uL — AB (ref 4.22–5.81)
RDW: 15.3 % (ref 11.5–15.5)
WBC: 16 10*3/uL — AB (ref 4.0–10.5)

## 2016-06-09 LAB — COMPREHENSIVE METABOLIC PANEL
ALK PHOS: 53 U/L (ref 38–126)
ALT: 23 U/L (ref 17–63)
ANION GAP: 8 (ref 5–15)
AST: 27 U/L (ref 15–41)
Albumin: 3.1 g/dL — ABNORMAL LOW (ref 3.5–5.0)
BUN: 9 mg/dL (ref 6–20)
CALCIUM: 8.7 mg/dL — AB (ref 8.9–10.3)
CO2: 23 mmol/L (ref 22–32)
CREATININE: 0.96 mg/dL (ref 0.61–1.24)
Chloride: 109 mmol/L (ref 101–111)
Glucose, Bld: 91 mg/dL (ref 65–99)
Potassium: 3.8 mmol/L (ref 3.5–5.1)
Sodium: 140 mmol/L (ref 135–145)
Total Bilirubin: 0.9 mg/dL (ref 0.3–1.2)
Total Protein: 5.6 g/dL — ABNORMAL LOW (ref 6.5–8.1)

## 2016-06-09 LAB — GLUCOSE, CAPILLARY
GLUCOSE-CAPILLARY: 92 mg/dL (ref 65–99)
GLUCOSE-CAPILLARY: 95 mg/dL (ref 65–99)
Glucose-Capillary: 108 mg/dL — ABNORMAL HIGH (ref 65–99)
Glucose-Capillary: 96 mg/dL (ref 65–99)
Glucose-Capillary: 87 mg/dL (ref 65–99)

## 2016-06-09 LAB — PROCALCITONIN: PROCALCITONIN: 8.43 ng/mL

## 2016-06-09 MED ORDER — SODIUM CHLORIDE 0.9 % IV SOLN
3.0000 g | Freq: Three times a day (TID) | INTRAVENOUS | Status: DC
  Administered 2016-06-09 – 2016-06-11 (×6): 3 g via INTRAVENOUS
  Filled 2016-06-09 (×9): qty 3

## 2016-06-09 MED ORDER — DEXMEDETOMIDINE HCL 200 MCG/2ML IV SOLN
0.2000 ug/kg/h | INTRAVENOUS | Status: DC
  Filled 2016-06-09: qty 2

## 2016-06-09 NOTE — Progress Notes (Signed)
Pt has continually stated to me that he is going to leave hospital and that they should have left him at home where they found him.
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
## 2016-06-09 NOTE — Progress Notes (Signed)
   [facility]  > pt did well on PST. Extubated and doing well > start CIWA protocol > keep on ativan 1 mg q6 for now > needs sitter. ? SI   J. Alexis FrockAngelo A de Dios, MD 06/09/2016, 3:33 PM [facility] Pager [phone] After 3 pm or if no answer, call [phone]

## 2016-06-09 NOTE — Procedures (Signed)
Extubation Procedure Note  Patient Details:   Name: Leonard Guerrero DOB: [birth year] MRN: [id number]   Airway Documentation:     Evaluation  O2 sats: stable throughout Complications: No apparent complications Patient did tolerate procedure well. Bilateral Breath Sounds: Clear, Diminished   Yes  Pt extubated per Md. Pt placed on 4lpm 02. Vitals as noted   Leonard Guerrero, Leonard Guerrero 06/09/2016, 11:06 AM

## 2016-06-09 NOTE — Progress Notes (Signed)
PULMONARY / CRITICAL CARE MEDICINE   Name: Leonard Guerrero MRN: [id number] DOB: [birth year]    ADMISSION DATE:  06/07/2016 CHIEF COMPLAINT:  Found down  HISTORY OF PRESENT ILLNESS:   41 yo male with h/o seizure disorder who was found down today by roommate (not present in ED).  Roommate reportedly told EMS he had seen the patient convulsing today when he found the patient.  Currently the  patient is intubated and no other persons at bedside.  Attempted to call emergency contact but went strait to voicemail. HPI is taken from EMS, ED staff.    EMS arrival Narcan was administered without response, BG on scene was normal.  The patient is known to take Lamictal, gabapentin and propranolol.  Numerous pill bottles found in the home, some empty, some full.  In the ED patient was regaining consciousness spontaneously but than had intractable vomiting and was intubated for airway protection.   SUBJECTIVE:  Sedated on the vent No issues overnight. HR better with less sedation.    VITAL SIGNS: Temp:  [98.1 F (36.7 C)-100.3 F (37.9 C)] 100.3 F (37.9 C) (05/06 0751) Pulse Rate:  [46-65] 56 (05/06 0800) Resp:  [14-19] 19 (05/06 0800) BP: (81-124)/(49-77) 113/65 (05/06 0800) SpO2:  [94 %-100 %] 100 % (05/06 0800) FiO2 (%):  [30 %-40 %] 30 % (05/06 0800) Weight:  [87.5 kg (192 lb 14.4 oz)] 87.5 kg (192 lb 14.4 oz) (05/06 0500) HEMODYNAMICS:   VENTILATOR SETTINGS: Vent Mode: PRVC FiO2 (%):  [30 %-40 %] 30 % Set Rate:  [16 bmp] 16 bmp Vt Set:  [620 mL] 620 mL PEEP:  [5 cmH20] 5 cmH20 Plateau Pressure:  [9 cmH20-15 cmH20] 15 cmH20 INTAKE / OUTPUT:  Intake/Output Summary (Last 24 hours) at 06/09/16 0901 Last data filed at 06/09/16 0700  Gross per 24 hour  Intake          1587.15 ml  Output             1440 ml  Net           147.15 ml    PHYSICAL EXAMINATION: General:  Sedated. Unkempt.  Neuro:  CN grossly intact. (-) lateralizing signs HEENT:  Cervical collar in place.  Pupils 5mm  and reactive bilaterally Cardiovascular:  Bradycardic, no m/r/g Lungs:  Mechanical breath sounds bilaterally. cta Abdomen:  Soft, normal bowel sounds Musculoskeletal:  Normal bulk and tone Skin:  Numerous tatoos and old, healing abrasion on the left anterior tibia  LABS:  CBC  Recent Labs Lab 06/08/16 0230 06/08/16 0559 06/09/16 0237  WBC 11.6* 12.4* 16.0*  HGB 12.5* 12.9* 12.8*  HCT 37.8* 39.3 38.7*  PLT 134* 131* 146*   Coag's  Recent Labs Lab 06/08/16 0230  INR 1.16   BMET  Recent Labs Lab 06/08/16 0559 06/08/16 1627 06/09/16 0237  NA 139 140 140  K 3.7 4.0 3.8  CL 110 111 109  CO2 20* 19* 23  BUN 9 11 9   CREATININE 0.96 1.09 0.96  GLUCOSE 187* 65 91   Electrolytes  Recent Labs Lab 06/08/16 0230 06/08/16 0559 06/08/16 1627 06/09/16 0237  CALCIUM  --  8.4* 8.8* 8.7*  MG 1.9  --   --   --   PHOS 3.3  --   --   --    Sepsis Markers  Recent Labs Lab 06/07/16 2215 06/08/16 0230 06/08/16 0559  LATICACIDVEN 2.92* 1.7 1.9   ABG  Recent Labs Lab 06/07/16 2237  PHART 7.255*  PCO2ART 39.2  PO2ART 242.0*   Liver Enzymes  Recent Labs Lab 06/08/16 0559 06/08/16 1124 06/09/16 0237  AST 38 36 27  ALT 26 26 23   ALKPHOS 53 55 53  BILITOT 1.2 1.3* 0.9  ALBUMIN 3.2* 3.3* 3.1*   Cardiac Enzymes  Recent Labs Lab 06/08/16 0230 06/08/16 0559 06/08/16 1124  TROPONINI <0.03 <0.03 <0.03   Glucose  Recent Labs Lab 06/08/16 0229 06/08/16 1934 06/08/16 2029 06/08/16 2345 06/09/16 0440 06/09/16 0754  GLUCAP 130* 62* 83 100* 92 95    Imaging No results found.   ASSESSMENT / PLAN: 41 yo CM with reported history of seizure disorder, found down today after possible seizure and now with post ictal phase compounded by etomidate administration. Possible drug overdose.  Also with elevated ETOH level and UDS (+) for cocaine  PULMONARY A: Acute hypoxemic respiratory failure 2/2 unable to protect airway with Polysubstance abuse P:   -  PST.  If he wakes up and is appropriate, anticipate we can quickly wean and extubate.  - VAP prevention bundle   CARDIOVASCULAR  A:  Sinus bradycardia, improved.  Lactatemia without shock. resolved P:  - Patient taking propranolol - no signs of hemodynamic instability.  - Bradycardia is significantly improved. Propofol was making him bradycardic as well.  - no clear history on this pt.  Not sure if he intentionally took more of his home meds (propranolol + lamictal + gabapentin).  - f/u on echo  RENAL A:   NAGMA, resolved P:   -  Cont IVF for now.    GASTROINTESTINAL A:   Vomiting Elevated tylenol level without transaminitis. Tylenol levels on 5/6 normal.  Elevated EtOH level - without hepatitis  P:   - PPI for GI PPX - Zofran for nausea - Folate, thiamine daily - start TF if not extubated today.    HEMATOLOGIC A:   Leukocytosis P:  - Trend WBC - No obvious source of infection   INFECTIOUS A:   No obvious sepsis or source P:   Panculture Abx: Holding for now unless he decompensates   ENDOCRINE A:   Not active    P:   - Maintain BG <180  MSK A:  Cervical collar  P:  - CT neck negative for acute fracture - Given AMS will not clear collar.  Will let pt wake up and decide on removing collar after.     NEUROLOGIC A:   Acute encephalopathy likely 2/2 ETOH intoxication/ withdrawal. Concern for overdose on his pills (lamictal + propranolol + gabapentin). H/O sze disorder.  Seizure d/o with possible tonic-clonic activity  Depression. Not sure if there is SI.  P:   RASS goal: 0 EEG was (-) for seizures cont ativan 1 mg q6 for etoh intoxication + ativan prn.  Keep on CIWA if extubated.  Cont keppra IV > transition to PO or lamictal when able   DVT PPX: Lovenox FEN: Tube feeds if not extubated.  Code status: Full  FAMILY  - Updates: Mother and ex wife were updated on 5/5.   Total critical care time: 30 min  J. Alexis FrockAngelo A de Dios,  MD 06/09/2016, 9:01 AM [facility] Pager [phone] After 3 pm or if no answer, call [phone]

## 2016-06-09 NOTE — Progress Notes (Signed)
Per family pt had multiple suicidal attempt in the past. On psych treatment takes Seroquel and get Haldol shots once a month, per family.  Pharmacy aware, will try to get more information from his pharmacy/clinic by Monday.  Danne HarborJohny,RN

## 2016-06-10 ENCOUNTER — Encounter (HOSPITAL_COMMUNITY): Payer: Self-pay | Admitting: *Deleted

## 2016-06-10 ENCOUNTER — Inpatient Hospital Stay (HOSPITAL_COMMUNITY): Payer: Medicaid Other

## 2016-06-10 DIAGNOSIS — J9601 Acute respiratory failure with hypoxia: Secondary | ICD-10-CM

## 2016-06-10 DIAGNOSIS — T1491XA Suicide attempt, initial encounter: Secondary | ICD-10-CM

## 2016-06-10 DIAGNOSIS — G9340 Encephalopathy, unspecified: Secondary | ICD-10-CM

## 2016-06-10 LAB — CBC
HEMATOCRIT: 35.1 % — AB (ref 39.0–52.0)
HEMOGLOBIN: 11.8 g/dL — AB (ref 13.0–17.0)
MCH: 31.6 pg (ref 26.0–34.0)
MCHC: 33.6 g/dL (ref 30.0–36.0)
MCV: 93.9 fL (ref 78.0–100.0)
Platelets: 127 10*3/uL — ABNORMAL LOW (ref 150–400)
RBC: 3.74 MIL/uL — AB (ref 4.22–5.81)
RDW: 14.8 % (ref 11.5–15.5)
WBC: 10.9 10*3/uL — ABNORMAL HIGH (ref 4.0–10.5)

## 2016-06-10 LAB — GLUCOSE, CAPILLARY
GLUCOSE-CAPILLARY: 115 mg/dL — AB (ref 65–99)
GLUCOSE-CAPILLARY: 103 mg/dL — AB (ref 65–99)
Glucose-Capillary: 98 mg/dL (ref 65–99)
Glucose-Capillary: 131 mg/dL — ABNORMAL HIGH (ref 65–99)

## 2016-06-10 LAB — COMPREHENSIVE METABOLIC PANEL
ALBUMIN: 2.8 g/dL — AB (ref 3.5–5.0)
ALK PHOS: 48 U/L (ref 38–126)
ALT: 18 U/L (ref 17–63)
ANION GAP: 7 (ref 5–15)
AST: 21 U/L (ref 15–41)
BUN: <5 mg/dL — ABNORMAL LOW (ref 6–20)
CHLORIDE: 108 mmol/L (ref 101–111)
CO2: 23 mmol/L (ref 22–32)
Calcium: 8.1 mg/dL — ABNORMAL LOW (ref 8.9–10.3)
Creatinine, Ser: 0.82 mg/dL (ref 0.61–1.24)
GFR calc non Af Amer: >60 mL/min (ref >60)
GLUCOSE: 107 mg/dL — AB (ref 65–99)
Potassium: 3.1 mmol/L — ABNORMAL LOW (ref 3.5–5.1)
SODIUM: 138 mmol/L (ref 135–145)
Total Bilirubin: 0.9 mg/dL (ref 0.3–1.2)
Total Protein: 4.9 g/dL — ABNORMAL LOW (ref 6.5–8.1)

## 2016-06-10 LAB — ECHOCARDIOGRAM COMPLETE
HEIGHTINCHES: 73 in
Weight: 3072.33 oz

## 2016-06-10 LAB — PROCALCITONIN: Procalcitonin: 5.21 ng/mL

## 2016-06-10 MED ORDER — DIAZEPAM 2 MG PO TABS
2.0000 mg | ORAL_TABLET | Freq: Two times a day (BID) | ORAL | Status: DC
  Administered 2016-06-10 – 2016-06-13 (×6): 2 mg via ORAL
  Filled 2016-06-10 (×6): qty 1

## 2016-06-10 MED ORDER — POTASSIUM CHLORIDE CRYS ER 10 MEQ PO TBCR
30.0000 meq | EXTENDED_RELEASE_TABLET | ORAL | Status: AC
  Administered 2016-06-10 (×2): 30 meq via ORAL
  Filled 2016-06-10 (×2): qty 1

## 2016-06-10 MED ORDER — NICOTINE 14 MG/24HR TD PT24
14.0000 mg | MEDICATED_PATCH | Freq: Every day | TRANSDERMAL | Status: DC
  Administered 2016-06-10 – 2016-06-13 (×4): 14 mg via TRANSDERMAL
  Filled 2016-06-10 (×4): qty 1

## 2016-06-10 MED ORDER — PROSIGHT PO TABS
1.0000 | ORAL_TABLET | Freq: Every day | ORAL | Status: DC
  Administered 2016-06-10 – 2016-06-13 (×4): 1 via ORAL
  Filled 2016-06-10 (×4): qty 1

## 2016-06-10 MED ORDER — ACETAMINOPHEN 160 MG/5ML PO SOLN
650.0000 mg | Freq: Once | ORAL | Status: AC
  Administered 2016-06-10: 650 mg via ORAL
  Filled 2016-06-10: qty 20.3

## 2016-06-10 NOTE — Progress Notes (Signed)
  Echocardiogram 2D Echocardiogram has been performed.  Nolon RodBrown, Tony 06/10/2016, 1:50 PM

## 2016-06-10 NOTE — Progress Notes (Signed)
[facility] Attending Note  Presenting HPI:  40 y.o. male with known history of seizure disorder. Found down by roommate. Roommate reportedly told EMS that he had seen patient convulsing today when he was found. Narcan was administered without response. Patient's blood glucose was normal. Numerous pill bottles were found in the home some of which were empty and somewhat which were full. Upon arrival to the emergency department his mental status progressively improved but then he became nauseous and developed intractable vomiting. Patient was subsequently intubated for airway protection.  Subjective:  Started on diet overnight after being extubated yesterday. Patient does admit to possibly wanting to hurt himself just prior to presentation to the hospital. Denies recalling any other events. Denies any difficulty breathing or dyspnea.  Review of Systems:  Patient denies any fever or chills. Denies any chest pain or pressure.  Temp:  [98.2 F (36.8 C)-102.3 F (39.1 C)] 99.7 F (37.6 C) (05/07 0415) Pulse Rate:  [54-80] 69 (05/07 0600) Resp:  [15-32] 26 (05/07 0600) BP: (97-121)/(49-80) 109/70 (05/07 0600) SpO2:  [89 %-100 %] 96 % (05/07 0600) FiO2 (%):  [30 %] 30 % (05/06 0800) Weight:  [87.1 kg (192 lb 0.3 oz)] 87.1 kg (192 lb 0.3 oz) (05/07 0400)  General:  No family at bedside. Cerebellar bedside. No distress. Integument:  Warm & dry. No rash on exposed skin. Tattoo is noted. HEENT:  Moist mucus memebranes. No scleral icterus.  Neurological:  Oriented to year, president, place and person. Moving all 4 extremities equally. Grossly nonfocal. Psychiatric: Mood and affect somewhat blunted. Admits to suicidal ideation preceding his hospital admission. Musculoskeletal:  No joint effusion or erythema appreciated. Symmetric muscle bulk. Pulmonary:  Normal work of breathing on nasal cannula oxygen. Overall clear to auscultation. Cardiovascular:  Regular rate & rhythm. No  edema. Normal S1 & S2. Telemetry:  Sinus rhythm. Abdomen:  Soft. Nondistended. Normoactive bowel sounds.  LINES/TUBES: OETT 5/4 - 5/6 Foley 5/4 >>> PIV  CBC Latest Ref Rng & Units 06/10/2016 06/09/2016 06/08/2016  WBC 4.0 - 10.5 K/uL 10.9(H) 16.0(H) 12.4(H)  Hemoglobin 13.0 - 17.0 g/dL 11.8(L) 12.8(L) 12.9(L)  Hematocrit 39.0 - 52.0 % 35.1(L) 38.7(L) 39.3  Platelets 150 - 400 K/uL 127(L) 146(L) 131(L)   BMP Latest Ref Rng & Units 06/10/2016 06/09/2016 06/08/2016  Glucose 65 - 99 mg/dL 107(H) 91 65  BUN 6 - 20 mg/dL <5(L) 9 11  Creatinine 0.61 - 1.24 mg/dL 0.82 0.96 1.09  Sodium 135 - 145 mmol/L 138 140 140  Potassium 3.5 - 5.1 mmol/L 3.1(L) 3.8 4.0  Chloride 101 - 111 mmol/L 108 109 111  CO2 22 - 32 mmol/L 23 23 19(L)  Calcium 8.9 - 10.3 mg/dL 8.1(L) 8.7(L) 8.8(L)   Hepatic Function Latest Ref Rng & Units 06/10/2016 06/09/2016 06/08/2016  Total Protein 6.5 - 8.1 g/dL 4.9(L) 5.6(L) 5.5(L)  Albumin 3.5 - 5.0 g/dL 2.8(L) 3.1(L) 3.3(L)  AST 15 - 41 U/L 21 27 36  ALT 17 - 63 U/L 18 23 26  Alk Phosphatase 38 - 126 U/L 48 53 55  Total Bilirubin 0.3 - 1.2 mg/dL 0.9 0.9 1.3(H)  Bilirubin, Direct 0.1 - 0.5 mg/dL - - 0.3    IMAGING/STUDIES: UDS 5/4:  Positive Cocaine CT HEAD/C-SPINE 5/4: IMPRESSION: 1. No acute intracranial abnormality. The visualized paranasal sinuses are notable for mild-to-moderate mucosal thickening of the ethmoid and frontal sinuses. 2. No acute posttraumatic cervical spine fracture or subluxation. EEG 5/5:  This is an abnormal EEG due to   the presence of moderate diffuse generalized slowing. There are no episode form discharges or seizures. PORT CXR 5/6:  Personally reviewed by me. Questionable patchy right lower lobe focal opacity. Endotracheal tube in good position. No pleural effusion appreciated.  MICROBIOLOGY: MRSA PCR 5/5:  Negative  HIV 5/5:  Nonreactive Blood Cultures x2 5/5 >>> Tracheal Aspirate Culture 5/5 >>>  ANTIBIOTICS: Unasyn 5/6 >>>  SIGNIFICANT  EVENTS: 05/04 - Admit  ASSESSMENT/PLAN:  41 y.o. male presenting with acute encephalopathy secondary to polysubstance abuse and probable overdose with suicidal ideation/attempt. Patient successfully extubate her yesterday and respiratory status remains stable. Hemodynamically the patient is stable as well and tolerating an oral diet. Admits to wanting to hurt himself prior to presenting to the hospital.  1. Acute hypoxic respiratory failure: Likely secured to aspiration. Continuing to wean FiO2. 2. Acute encephalopathy: Resolving. Secondary to polysubstance abuse with suicide attempt. 3. Suicide attempt with overdose: Consulting social worker for involuntary commitment. Plan to consult psychiatry after IVC is performed. Continuing suicide precautions and sitter at bedside. 4. Alcohol abuse: Continuing thiamine and folic acid as well as multivitamin oral daily. Continuing CIWA protocol. Discontinuing IV Ativan and transitioning to by mouth Valium 2 mg by mouth every 12 hours. 5. Aspiration pneumonia: Continuing Unasyn day #2. Likely can transition to oral Augmentin tomorrow. Recommend a 7 day course. Trending Procalcitonin per algorithm. 6. Possible seizure: Witnessed by a roommate prior to arrival with known history of seizure. Appreciated input from neurology. Continuing seizure precautions. 7. Elevated Tylenol level: Resolved. Unclear whether or not this was intentional or simply due to normal usage. No transaminitis. 8. Sinus bradycardia: Likely multifactorial from possible beta blocker overdose versus sedation. Resolved. Continuing to monitor patient on telemetry. 9. Non-anion gap metabolic acidosis: Resolved. Monitoring electrodes daily. 10. Hypokalemia: Replaced. Continuing to trend electrolytes daily.  Prophylaxis:  SCDs & Lovenox [place] q24hr. D/C Pepcid today.  Diet:  Regular diet.  Code Status:  Full Code.  Disposition:  Transferring to SDU but leaving suicide precautions in place. Sitter  will remain at bedside.  Family Update:  Patient updated at bedside during rounds.  I have spent a total of 37 minutes of time today caring for the patient, reviewing the patient's electronic medical record, and with more than 50% of that time spent coordinating care with the patient as well as reviewing the continuing plan of care with the patient at bedside.  TRH to assume care & PCCM signing off as of 5/8.    E. , M.D. [facility] Pager:  [phone] After 3pm or if no response, call [phone] 7:14 AM 06/10/16      

## 2016-06-10 NOTE — Progress Notes (Signed)
CSW completed IVC paperwork, per MD request. CSW faxed paperwork to Magistrate, pt will be served today. CSW to follow up with any necessary needs.  Blenda Nicelylizabeth [place] LCSW [phone]

## 2016-06-10 NOTE — Progress Notes (Signed)
[facility] LAB REPLACEMENT ONLY  The patient does [facility] the [facility] based on the criteria listed below:   1. Is GFR >/= 40 ml/min? Yes.    Patient's GFR today is >60 2. Is urine output >/= 0.5 ml/kg/hr for the last 6 hours? Yes.   Patient's UOP is 0.7 ml/kg/hr 3. Is BUN < 60 mg/dL? Yes.    Patient's BUN today is <5 4. Abnormal electrolyte(s): k 3.1 5. Ordered repletion with: protocol 6. If a panic level lab has been reported, has the CCM MD in charge been notified? No..   Physician:    Markus DaftWHELAN, Levie Owensby A 06/10/2016 4:34 AM

## 2016-06-10 NOTE — Care Management Note (Signed)
Case Management Note  Patient Details  Name: Leonard Guerrero MRN: [id number] Date of Birth: 10-07-1975  Subjective/Objective:    From home with room mate , presenting with acute encephalopathy secondary to polysubstance abuse and probable overdose with suicidal ideation/attempt.  Self extubated yesteday,  On Ciwa protocol for ETOH abuse, CSW consult for IVC per MD note will consult psych after IVC performed.                 Action/Plan: NCM will cont to follow for dc needs.  Expected Discharge Date:                  Expected Discharge Plan:     In-House Referral:     Discharge planning Services  CM Consult  Post Acute Care Choice:    Choice offered to:     DME Arranged:    DME Agency:     HH Arranged:    HH Agency:     Status of Service:  In process, will continue to follow  If discussed at Long Length of Stay Meetings, dates discussed:    Additional Comments:  [name], Debi Cousin [place], RN 06/10/2016, 2:51 PM

## 2016-06-10 NOTE — Procedures (Signed)
ELECTROENCEPHALOGRAM REPORT  Date of Study: 06/10/2016  Patient's Name: Leonard Guerrero MRN: [id number] Date of Birth: Sep 05, 1975  Referring Provider: Rhona Leavensimothy Oster, MD  Clinical History: 41 year old man with history of seizures found unresponsive with cocaine and EtOH in his system.    Medications: Ampicillin-Sulbactam (UNASYN) 3 g in sodium chloride 0.9 % 100 mL IVPB  dexmedetomidine (PRECEDEX) 200 mcg in sodium chloride 0.9 % 50 mL (4 mcg/mL) infusion  dextrose 5 %-0.45 % sodium chloride infusion  enoxaparin (LOVENOX) injection 40 mg  folic acid (FOLVITE) tablet 1 mg  LORazepam (ATIVAN) injection 1 mg  multivitamin liquid 15 mL  thiamine (VITAMIN B-1) tablet 100 mg   Technical Summary: A multichannel digital EEG recording measured by the international 10-20 system with electrodes applied with paste and impedances below 5000 ohms performed as portable with EKG monitoring in an awake but drowsy patient.  Hyperventilation and photic stimulation were not performed.  The digital EEG was referentially recorded, reformatted, and digitally filtered in a variety of bipolar and referential montages for optimal display.   Description: The patient is awake but drowsy during the recording.  During maximal wakefulness, there is a symmetric, medium voltage 10-11 Hz posterior dominant rhythm that attenuates with eye opening. This is admixed with a mild amount of diffuse 4-5 Hz theta and 2-3 Hz delta slowing of the waking background.  During drowsiness and sleep, there is an increase in theta slowing of the background.  Vertex waves and symmetric sleep spindles were seen.  There were no epileptiform discharges or electrographic seizures seen.    EKG lead was unremarkable.  Impression: This awake and asleep EEG is mildly abnormal due to mild diffuse slowing of the waking background.  Clinical Correlation of the above findings indicates diffuse cerebral dysfunction that is non-specific in etiology and  can be seen with hypoxic/ischemic injury, toxic/metabolic encephalopathies, neurodegenerative disorders, or medication effect.  The absence of epileptiform discharges does not rule out a clinical diagnosis of epilepsy.  Clinical correlation is advised.  Shon MilletAdam Chena Chohan, DO

## 2016-06-10 NOTE — Progress Notes (Signed)
BEdside RN Gunnar FusiPaula calling eMD Patient wants to eat regular diet   PLAN Regular diet  Dr. Kalman ShanMurali Caytlyn Evers, M.D., [facility] Pulmonary and Critical Care Medicine Staff Physician [facility] Pager: [phone], If no answer or between  15:00h - 7:00h: call [phone]  06/10/2016 3:34 AM

## 2016-06-10 NOTE — Progress Notes (Signed)
EEG Completed; Results Pending  

## 2016-06-10 NOTE — Progress Notes (Signed)
eLink Physician-Brief Progress Note Patient Name: Leonard Guerrero DOB: Oct 22, 1975 MRN: [id number]   Date of Service  06/10/2016  HPI/Events of Note  headache  eICU Interventions  tylenal x 1     Intervention Category Minor Interventions: Routine modifications to care plan (e.g. PRN medications for pain, fever)  Nelda BucksFEINSTEIN,Leonard J. 06/10/2016, 4:24 PM

## 2016-06-10 NOTE — Progress Notes (Signed)
EEG has been read and showed no epileptiform discharges or electrographic seizures seen. At this time no further diagnostic test recommended. Will D/C Keppra. Neurology S/O.   Felicie Mornavid Louden Houseworth PA-C [facility] [phone]  M-F  (8:30 am- 4 PM)  06/10/2016, 11:30 AM

## 2016-06-11 DIAGNOSIS — F200 Paranoid schizophrenia: Secondary | ICD-10-CM

## 2016-06-11 DIAGNOSIS — R45851 Suicidal ideations: Secondary | ICD-10-CM

## 2016-06-11 DIAGNOSIS — F1099 Alcohol use, unspecified with unspecified alcohol-induced disorder: Secondary | ICD-10-CM

## 2016-06-11 DIAGNOSIS — T50902A Poisoning by unspecified drugs, medicaments and biological substances, intentional self-harm, initial encounter: Principal | ICD-10-CM

## 2016-06-11 DIAGNOSIS — J690 Pneumonitis due to inhalation of food and vomit: Secondary | ICD-10-CM

## 2016-06-11 LAB — RENAL FUNCTION PANEL
ALBUMIN: 2.9 g/dL — AB (ref 3.5–5.0)
ANION GAP: 6 (ref 5–15)
BUN: 5 mg/dL — ABNORMAL LOW (ref 6–20)
CALCIUM: 8.4 mg/dL — AB (ref 8.9–10.3)
CO2: 24 mmol/L (ref 22–32)
CREATININE: 0.88 mg/dL (ref 0.61–1.24)
Chloride: 109 mmol/L (ref 101–111)
GFR calc non Af Amer: >60 mL/min (ref >60)
GLUCOSE: 90 mg/dL (ref 65–99)
PHOSPHORUS: 4.7 mg/dL — AB (ref 2.5–4.6)
Potassium: 3.6 mmol/L (ref 3.5–5.1)
SODIUM: 139 mmol/L (ref 135–145)

## 2016-06-11 LAB — CULTURE, RESPIRATORY W GRAM STAIN: Special Requests: NORMAL

## 2016-06-11 LAB — CBC
HEMATOCRIT: 36.2 % — AB (ref 39.0–52.0)
HEMOGLOBIN: 12.1 g/dL — AB (ref 13.0–17.0)
MCH: 31.3 pg (ref 26.0–34.0)
MCHC: 33.4 g/dL (ref 30.0–36.0)
MCV: 93.5 fL (ref 78.0–100.0)
Platelets: 134 10*3/uL — ABNORMAL LOW (ref 150–400)
RBC: 3.87 MIL/uL — ABNORMAL LOW (ref 4.22–5.81)
RDW: 14.7 % (ref 11.5–15.5)
WBC: 7.6 10*3/uL (ref 4.0–10.5)

## 2016-06-11 LAB — PROCALCITONIN: PROCALCITONIN: 3.54 ng/mL

## 2016-06-11 LAB — LAMOTRIGINE LEVEL: LAMOTRIGINE LVL: 22.7 ug/mL — AB (ref 2.0–20.0)

## 2016-06-11 LAB — MAGNESIUM: MAGNESIUM: 1.7 mg/dL (ref 1.7–2.4)

## 2016-06-11 MED ORDER — AMOXICILLIN-POT CLAVULANATE 875-125 MG PO TABS
1.0000 | ORAL_TABLET | Freq: Two times a day (BID) | ORAL | Status: DC
  Administered 2016-06-11 – 2016-06-13 (×5): 1 via ORAL
  Filled 2016-06-11 (×5): qty 1

## 2016-06-11 MED ORDER — SODIUM CHLORIDE 0.9 % IV SOLN
INTRAVENOUS | Status: DC
  Administered 2016-06-11 (×2): via INTRAVENOUS

## 2016-06-11 NOTE — Progress Notes (Signed)
PROGRESS NOTE    Leonard Guerrero  XBJ:[id number] DOB: [birth year] DOA: 06/07/2016 PCP: No primary care provider on file.   Brief Narrative:  41 year old male with history of seizures, depression, schizophrenia was brought to the ER after he was found down by his roommates. Apparently he says he overdosed on several of his home psychiatry medicines but doesn't know exactly which ones. At the time of admission he was obtunded unresponsive to Narcan he was intubated and then eventually extubated on 06/09/2016. The time of admission he was also concerns of aspiration pneumonia therefore he was started on Unasyn. Neurology was consulted due to his mental status performed an EEG which was negative for any seizure activity. Given his history of alcohol abuse he was also placed on CIWA protocol. Case management was consulted for IVC.    Assessment & Plan:   Active Problems:   Encephalopathy acute   Acute encephalopathy   Acute respiratory failure (HCC)   Altered mental status   Alcoholic intoxication without complication (HCC)  Acute hypoxic respiratory failure, improved -Likely secondary to aspiration pneumonia -Continue to wean him off oxygen -Continue antibiotics at this time  Acute metabolic encephalopathy -Improved this morning. Likely secondary to polysubstance overdose -Neurology following. Status post EEG on 06/10/2016 which was negative for seizure activity -Recommended to stop Keppra  Aspiration pneumonia; likely happened when he was found unresponsive -We'll change Unasyn to Augmentin for 7 more days -Aspiration precaution -Supplemental oxygen if needed -Culture remains negative   Reports of dizziness- Mild Dehydration -Likely secondary to mild dehydration -Advised nursing staff that he is okay to eat this morning -Normal saline at 100 mL an hour. Monitor urine output  Suicidal ideation -Tells me it was an intentional overdose. He does feel like hurting himself  -case  management working on IVC -Consult to psychiatry this morning to help with medication management -Sitter in place. Suicide precautions  History of alcohol use -Counseled to quit alcohol -CIWA protocol   on Valium 2 mg twice daily -Continue thiamine and folate.  History of tobacco use -Counseled on quitting X-nicotine patch   Moderate Pulm HTN -as noted on Echo from 5/7. Cont to monitor. Follow up outpatient and monitor closely.   DVT prophylaxis: Lovenox Code Status: Full  Family Communication:  None at bedside  Disposition Plan: Closely monitor inpatient.   Consultants:   Neurology   Pulm   Psych called 5/8 - eval pending   Procedures:   EEG 5/7  Intubation 5/5 - 5/7  Antimicrobials:   Unasyn  5/6- 5/7  Augmentin 5/8   Subjective: Patient is awake this morning and does not have any complaints besides mild dizziness and dry mouth. He is very hungry.   he tells me it was an intentional overdose for suicide attempt. This morning he still has some suicidal ideation. He used to follow Dr. Charm Barges as an outpatient every month but does not remember his medications at this time. He plans on moving to [place] once he is out of the hospital this time and states he'll find a new psychiatrist. Also reports he drinks 3-4 40 ounces beer every day.  Objective: Vitals:   06/11/16 0700 06/11/16 0726 06/11/16 0800 06/11/16 0900  BP: 120/74  (!) 127/59 124/71  Pulse: (!) 47  (!) 54 70  Resp: (!) 23  (!) 21 20  Temp:  97.6 F (36.4 C)    TempSrc:  Oral    SpO2: 93%  95% 94%  Weight:      Height:  Intake/Output Summary (Last 24 hours) at 06/11/16 0918 Last data filed at 06/11/16 0900  Gross per 24 hour  Intake              920 ml  Output             1025 ml  Net             -105 ml   Filed Weights   06/09/16 0500 06/10/16 0400 06/11/16 0500  Weight: 87.5 kg (192 lb 14.4 oz) 87.1 kg (192 lb 0.3 oz) 86.9 kg (191 lb 9.3 oz)    Examination:  General exam:  Appears calm and comfortable  Respiratory system: Clear to auscultation. Respiratory effort normal. Cardiovascular system: S1 & S2 heard, RRR. No JVD, murmurs, rubs, gallops or clicks. No pedal edema. Gastrointestinal system: Abdomen is nondistended, soft and nontender. No organomegaly or masses felt. Normal bowel sounds heard. Central nervous system: Alert and oriented. No focal neurological deficits. Extremities: Symmetric 5 x 5 power. Skin: No rashes, lesions or ulcers Psychiatry: Judgement and insight appear normal. Mood & affect appropriate.  Dry Mouth and mucous membrane    Data Reviewed:   CBC:  Recent Labs Lab 06/07/16 2149  06/08/16 0230 06/08/16 0559 06/09/16 0237 06/10/16 0318 06/11/16 0236  WBC 12.0*  --  11.6* 12.4* 16.0* 10.9* 7.6  NEUTROABS 8.4*  --   --   --   --   --   --   HGB 12.9*  < > 12.5* 12.9* 12.8* 11.8* 12.1*  HCT 38.1*  < > 37.8* 39.3 38.7* 35.1* 36.2*  MCV 93.8  --  95.0 95.4 95.1 93.9 93.5  PLT 174  --  134* 131* 146* 127* 134*  < > = values in this interval not displayed. Basic Metabolic Panel:  Recent Labs Lab 06/08/16 0230 06/08/16 0559 06/08/16 1627 06/09/16 0237 06/10/16 0318 06/11/16 0236  NA  --  139 140 140 138 139  K  --  3.7 4.0 3.8 3.1* 3.6  CL  --  110 111 109 108 109  CO2  --  20* 19* 23 23 24   GLUCOSE  --  187* 65 91 107* 90  BUN  --  9 11 9  <5* 5*  CREATININE 1.00 0.96 1.09 0.96 0.82 0.88  CALCIUM  --  8.4* 8.8* 8.7* 8.1* 8.4*  MG 1.9  --   --   --   --  1.7  PHOS 3.3  --   --   --   --  4.7*   GFR: Estimated Creatinine Clearance: 123.6 mL/min (by C-G formula based on SCr of 0.88 mg/dL). Liver Function Tests:  Recent Labs Lab 06/07/16 2149 06/08/16 0559 06/08/16 1124 06/09/16 0237 06/10/16 0318 06/11/16 0236  AST 33 38 36 27 21  --   ALT 26 26 26 23 18   --   ALKPHOS 52 53 55 53 48  --   BILITOT 0.9 1.2 1.3* 0.9 0.9  --   PROT 6.2* 5.4* 5.5* 5.6* 4.9*  --   ALBUMIN 3.7 3.2* 3.3* 3.1* 2.8* 2.9*    Recent  Labs Lab 06/08/16 0230  LIPASE 25    Recent Labs Lab 06/07/16 2314  AMMONIA 51*   Coagulation Profile:  Recent Labs Lab 06/08/16 0230  INR 1.16   Cardiac Enzymes:  Recent Labs Lab 06/08/16 0230 06/08/16 0559 06/08/16 1124 06/08/16 1627  CKTOTAL  --   --   --  131  CKMB  --   --   --  3.1  TROPONINI <0.03 <0.03 <0.03  --    BNP (last 3 results) No results for input(s): PROBNP in the last 8760 hours. HbA1C: No results for input(s): HGBA1C in the last 72 hours. CBG:  Recent Labs Lab 06/09/16 1603 06/09/16 2054 06/10/16 0033 06/10/16 0410 06/10/16 0849  GLUCAP 96 87 98 115* 103*   Lipid Profile: No results for input(s): CHOL, HDL, LDLCALC, TRIG, CHOLHDL, LDLDIRECT in the last 72 hours. Thyroid Function Tests: No results for input(s): TSH, T4TOTAL, FREET4, T3FREE, THYROIDAB in the last 72 hours. Anemia Panel: No results for input(s): VITAMINB12, FOLATE, FERRITIN, TIBC, IRON, RETICCTPCT in the last 72 hours. Sepsis Labs:  Recent Labs Lab 06/07/16 2215 06/08/16 0230 06/08/16 0559 06/09/16 1430 06/10/16 0318 06/11/16 0236  PROCALCITON  --   --   --  8.43 5.21 3.54  LATICACIDVEN 2.92* 1.7 1.9  --   --   --     Recent Results (from the past 240 hour(s))  MRSA PCR Screening     Status: None   Collection Time: 06/08/16  1:42 AM  Result Value Ref Range Status   MRSA by PCR NEGATIVE NEGATIVE Final    Comment:        The GeneXpert MRSA Assay (FDA approved for NASAL specimens only), is one component of a comprehensive MRSA colonization surveillance program. It is not intended to diagnose MRSA infection nor to guide or monitor treatment for MRSA infections.   Culture, blood (routine x 2)     Status: None (Preliminary result)   Collection Time: 06/08/16  2:36 AM  Result Value Ref Range Status   Specimen Description BLOOD LEFT HAND  Final   Special Requests   Final    BOTTLES DRAWN AEROBIC AND ANAEROBIC Blood Culture results may not be optimal due to  an excessive volume of blood received in culture bottles   Culture NO GROWTH 2 DAYS  Final   Report Status PENDING  Incomplete  Culture, blood (routine x 2)     Status: None (Preliminary result)   Collection Time: 06/08/16  2:48 AM  Result Value Ref Range Status   Specimen Description BLOOD RIGHT HAND  Final   Special Requests   Final    BOTTLES DRAWN AEROBIC ONLY Blood Culture adequate volume   Culture NO GROWTH 2 DAYS  Final   Report Status PENDING  Incomplete  Culture, respiratory (NON-Expectorated)     Status: None (Preliminary result)   Collection Time: 06/08/16 11:53 AM  Result Value Ref Range Status   Specimen Description TRACHEAL ASPIRATE  Final   Special Requests Normal  Final   Gram Stain   Final    RARE WBC PRESENT, PREDOMINANTLY PMN RARE SQUAMOUS EPITHELIAL CELLS PRESENT ABUNDANT GRAM POSITIVE COCCI IN PAIRS FEW GRAM POSITIVE RODS    Culture   Final    ABUNDANT STREPTOCOCCUS PNEUMONIAE REPEATING SENSITIVITIES    Report Status PENDING  Incomplete         Radiology Studies: Dg Chest Port 1 View  Result Date: 06/09/2016 CLINICAL DATA:  Shortness of breath and seizure. EXAM: PORTABLE CHEST 1 VIEW COMPARISON:  06/07/2016 chest radiograph FINDINGS: An endotracheal tube is identified with tip 4.5 cm above the carina. An NG tube enters the stomach with tip off the field of view. Cardiomegaly and mild pulmonary vascular congestion noted. There is no evidence of focal airspace disease, pulmonary edema, suspicious pulmonary nodule/mass, pleural effusion, or pneumothorax. No acute bony abnormalities are identified. IMPRESSION: Cardiomegaly with mild pulmonary vascular congestion. Support  apparatus as described. Electronically Signed   By: Harmon PierJeffrey  Hu M.D.   On: 06/09/2016 10:25        Scheduled Meds: . amoxicillin-clavulanate  1 tablet Oral Q12H  . diazepam  2 mg Oral BID  . enoxaparin (LOVENOX) injection  40 mg Subcutaneous Q24H  . folic acid  1 mg Oral Daily  .  multivitamin  1 tablet Oral Daily  . nicotine  14 mg Transdermal Daily  . thiamine  100 mg Oral Daily   Continuous Infusions: . sodium chloride 100 mL/hr at 06/11/16 0900     LOS: 4 days    Time spent: 35 mins     Ankit Joline Maxcyhirag Amin, MD [facility] Pager [phone]   If 7PM-7AM, please contact night-coverage [email or web address] Password [email or web address] 06/11/2016, 9:18 AM

## 2016-06-11 NOTE — Consult Note (Signed)
[facility] Consult   Reason for Consult:  Intentional overdose and alcohol intoxication Referring Physician:  Dr. Reesa Chew Patient Identification: Leonard Guerrero MRN:  [id number] Principal Diagnosis: <principal problem not specified> Diagnosis:   Patient Active Problem List   Diagnosis Date Noted  . Alcoholic intoxication without complication ([place]) [J69.678]   . Acute encephalopathy [G93.40] 06/08/2016  . Acute respiratory failure ([place]) [J96.00]   . Altered mental status [R41.82]   . Encephalopathy acute [G93.40] 06/07/2016    Total Time spent with patient: 1 hour  Subjective:   Leonard Guerrero is a 41 y.o. male patient admitted with AMS.  HPI: Leonard Guerrero is a 41 years old male admitted to [facility] with altered mental status and required medical ICU care for altered mental status. Patient reportedly feeling depressed, paranoid schizophrenia, auditory and visual hallucinations of his parents. Patient reportedly drinking natural lights 3 or 4, each one is 40 ounces. Patient reported he has been tired of being here and have no family support until he came to the hospital. Patient reported he tried to kill himself by intentional overdose of his psychiatric medication but could not name. Patient was found down by his roommates. Patient reportedly seen get psychiatric services at [facility]. Patient reportedly denies using cocaine but the urine drug screen is positive for cocaine. Patient reportedly has a sister who has been suffering with mental illness and been taking medication and who lives in [place]. Patient reported he spoke with his sister who lives in [place] and asking him to stay with him after discharge from the hospital. Reportedly patient daughters grandparents were in [place] and traveling to [place] in a week time. Patient minimizes his current symptoms of depression, suicidal or homicidal ideation, intention or plans. Patient has no evidence of  auditory/visual hallucinations, delusions or paranoia during my evaluation.  Medical history:  41 year old male with history of seizures, depression, schizophrenia was brought to the ER after he was found down by his roommates. Apparently he says he overdosed on several of his home psychiatry medicines but doesn't know exactly which ones. At the time of admission he was obtunded unresponsive to Narcan he was intubated and then eventually extubated on 06/09/2016. The time of admission he was also concerns of aspiration pneumonia therefore he was started on Unasyn. Neurology was consulted due to his mental status performed an EEG which was negative for any seizure activity. Given his history of alcohol abuse he was also placed on CIWA protocol. Case management was consulted for IVC  Past Psychiatric History: depression, schizophreniaAnd has no history of acute psychiatric hospitalization.  Risk to Self:   Risk to Others:   Prior Inpatient Therapy:   Prior Outpatient Therapy:    Past Medical History:  Past Medical History:  Diagnosis Date  . Bipolar 1 disorder ([place])   . ETOH abuse    History reviewed. No pertinent surgical history. Family History: No family history on file. Family Psychiatric  History: Patient has been living with his roommates in [place] and has been disabled. Patient has no family support.  Social History:  History  Alcohol use Not on file     History  Drug use: Unknown    Social History   Social History  . Marital status: Unknown    Spouse name: N/A  . Number of children: N/A  . Years of education: N/A   Social History Main Topics  . Smoking status: None  . Smokeless tobacco: None  . Alcohol use None  .  Drug use: Unknown  . Sexual activity: Not Asked   Other Topics Concern  . None   Social History Narrative  . None   Additional Social History:    Allergies:  No Known Allergies  Labs:  Results for orders placed or performed during the hospital  encounter of 06/07/16 (from the past 48 hour(s))  Procalcitonin - Baseline     Status: None   Collection Time: 06/09/16  2:30 PM  Result Value Ref Range   Procalcitonin 8.43 ng/mL    Comment:        Interpretation: PCT > 2 ng/mL: Systemic infection (sepsis) is likely, unless other causes are known. (NOTE)         ICU PCT Algorithm               Non ICU PCT Algorithm    ----------------------------     ------------------------------         PCT < 0.25 ng/mL                 PCT < 0.1 ng/mL     Stopping of antibiotics            Stopping of antibiotics       strongly encouraged.               strongly encouraged.    ----------------------------     ------------------------------       PCT level decrease by               PCT < 0.25 ng/mL       >= 80% from peak PCT       OR PCT 0.25 - 0.5 ng/mL          Stopping of antibiotics                                             encouraged.     Stopping of antibiotics           encouraged.    ----------------------------     ------------------------------       PCT level decrease by              PCT >= 0.25 ng/mL       < 80% from peak PCT        AND PCT >= 0.5 ng/mL            Continuing antibiotics                                               encouraged.       Continuing antibiotics            encouraged.    ----------------------------     ------------------------------     PCT level increase compared          PCT > 0.5 ng/mL         with peak PCT AND          PCT >= 0.5 ng/mL             Escalation of antibiotics  strongly encouraged.      Escalation of antibiotics        strongly encouraged.   Glucose, capillary     Status: None   Collection Time: 06/09/16  4:03 PM  Result Value Ref Range   Glucose-Capillary 96 65 - 99 mg/dL   Comment 1 Capillary Specimen    Comment 2 Notify RN   Glucose, capillary     Status: None   Collection Time: 06/09/16  8:54 PM  Result Value Ref Range    Glucose-Capillary 87 65 - 99 mg/dL   Comment 1 Notify RN   Glucose, capillary     Status: None   Collection Time: 06/10/16 12:33 AM  Result Value Ref Range   Glucose-Capillary 98 65 - 99 mg/dL   Comment 1 Notify RN   Comprehensive metabolic panel     Status: Abnormal   Collection Time: 06/10/16  3:18 AM  Result Value Ref Range   Sodium 138 135 - 145 mmol/L   Potassium 3.1 (L) 3.5 - 5.1 mmol/L    Comment: DELTA CHECK NOTED   Chloride 108 101 - 111 mmol/L   CO2 23 22 - 32 mmol/L   Glucose, Bld 107 (H) 65 - 99 mg/dL   BUN <5 (L) 6 - 20 mg/dL   Creatinine, Ser 0.82 0.61 - 1.24 mg/dL   Calcium 8.1 (L) 8.9 - 10.3 mg/dL   Total Protein 4.9 (L) 6.5 - 8.1 g/dL   Albumin 2.8 (L) 3.5 - 5.0 g/dL   AST 21 15 - 41 U/L   ALT 18 17 - 63 U/L   Alkaline Phosphatase 48 38 - 126 U/L   Total Bilirubin 0.9 0.3 - 1.2 mg/dL   GFR calc non Af Amer >60 >60 mL/min   GFR calc Af Amer >60 >60 mL/min    Comment: (NOTE) The eGFR has been calculated using the CKD EPI equation. This calculation has not been validated in all clinical situations. eGFR's persistently <60 mL/min signify possible Chronic Kidney Disease.    Anion gap 7 5 - 15  CBC     Status: Abnormal   Collection Time: 06/10/16  3:18 AM  Result Value Ref Range   WBC 10.9 (H) 4.0 - 10.5 K/uL   RBC 3.74 (L) 4.22 - 5.81 MIL/uL   Hemoglobin 11.8 (L) 13.0 - 17.0 g/dL   HCT 35.1 (L) 39.0 - 52.0 %   MCV 93.9 78.0 - 100.0 fL   MCH 31.6 26.0 - 34.0 pg   MCHC 33.6 30.0 - 36.0 g/dL   RDW 14.8 11.5 - 15.5 %   Platelets 127 (L) 150 - 400 K/uL  Procalcitonin     Status: None   Collection Time: 06/10/16  3:18 AM  Result Value Ref Range   Procalcitonin 5.21 ng/mL    Comment:        Interpretation: PCT > 2 ng/mL: Systemic infection (sepsis) is likely, unless other causes are known. (NOTE)         ICU PCT Algorithm               Non ICU PCT Algorithm    ----------------------------     ------------------------------         PCT < 0.25 ng/mL                  PCT < 0.1 ng/mL     Stopping of antibiotics            Stopping of antibiotics  strongly encouraged.               strongly encouraged.    ----------------------------     ------------------------------       PCT level decrease by               PCT < 0.25 ng/mL       >= 80% from peak PCT       OR PCT 0.25 - 0.5 ng/mL          Stopping of antibiotics                                             encouraged.     Stopping of antibiotics           encouraged.    ----------------------------     ------------------------------       PCT level decrease by              PCT >= 0.25 ng/mL       < 80% from peak PCT        AND PCT >= 0.5 ng/mL            Continuing antibiotics                                               encouraged.       Continuing antibiotics            encouraged.    ----------------------------     ------------------------------     PCT level increase compared          PCT > 0.5 ng/mL         with peak PCT AND          PCT >= 0.5 ng/mL             Escalation of antibiotics                                          strongly encouraged.      Escalation of antibiotics        strongly encouraged.   Glucose, capillary     Status: Abnormal   Collection Time: 06/10/16  4:10 AM  Result Value Ref Range   Glucose-Capillary 115 (H) 65 - 99 mg/dL   Comment 1 Notify RN   Glucose, capillary     Status: Abnormal   Collection Time: 06/10/16  8:49 AM  Result Value Ref Range   Glucose-Capillary 103 (H) 65 - 99 mg/dL   Comment 1 Document in Chart   Procalcitonin     Status: None   Collection Time: 06/11/16  2:36 AM  Result Value Ref Range   Procalcitonin 3.54 ng/mL    Comment:        Interpretation: PCT > 2 ng/mL: Systemic infection (sepsis) is likely, unless other causes are known. (NOTE)         ICU PCT Algorithm               Non ICU PCT Algorithm    ----------------------------     ------------------------------         PCT <  0.25 ng/mL                 PCT < 0.1  ng/mL     Stopping of antibiotics            Stopping of antibiotics       strongly encouraged.               strongly encouraged.    ----------------------------     ------------------------------       PCT level decrease by               PCT < 0.25 ng/mL       >= 80% from peak PCT       OR PCT 0.25 - 0.5 ng/mL          Stopping of antibiotics                                             encouraged.     Stopping of antibiotics           encouraged.    ----------------------------     ------------------------------       PCT level decrease by              PCT >= 0.25 ng/mL       < 80% from peak PCT        AND PCT >= 0.5 ng/mL            Continuing antibiotics                                               encouraged.       Continuing antibiotics            encouraged.    ----------------------------     ------------------------------     PCT level increase compared          PCT > 0.5 ng/mL         with peak PCT AND          PCT >= 0.5 ng/mL             Escalation of antibiotics                                          strongly encouraged.      Escalation of antibiotics        strongly encouraged.   CBC     Status: Abnormal   Collection Time: 06/11/16  2:36 AM  Result Value Ref Range   WBC 7.6 4.0 - 10.5 K/uL   RBC 3.87 (L) 4.22 - 5.81 MIL/uL   Hemoglobin 12.1 (L) 13.0 - 17.0 g/dL   HCT 36.2 (L) 39.0 - 52.0 %   MCV 93.5 78.0 - 100.0 fL   MCH 31.3 26.0 - 34.0 pg   MCHC 33.4 30.0 - 36.0 g/dL   RDW 14.7 11.5 - 15.5 %   Platelets 134 (L) 150 - 400 K/uL  Magnesium     Status: None   Collection Time: 06/11/16  2:36 AM  Result Value Ref Range   Magnesium 1.7 1.7 - 2.4 mg/dL  Renal function panel     Status: Abnormal   Collection Time: 06/11/16  2:36 AM  Result Value Ref Range   Sodium 139 135 - 145 mmol/L   Potassium 3.6 3.5 - 5.1 mmol/L   Chloride 109 101 - 111 mmol/L   CO2 24 22 - 32 mmol/L   Glucose, Bld 90 65 - 99 mg/dL   BUN 5 (L) 6 - 20 mg/dL   Creatinine, Ser 0.88 0.61  - 1.24 mg/dL   Calcium 8.4 (L) 8.9 - 10.3 mg/dL   Phosphorus 4.7 (H) 2.5 - 4.6 mg/dL   Albumin 2.9 (L) 3.5 - 5.0 g/dL   GFR calc non Af Amer >60 >60 mL/min   GFR calc Af Amer >60 >60 mL/min    Comment: (NOTE) The eGFR has been calculated using the CKD EPI equation. This calculation has not been validated in all clinical situations. eGFR's persistently <60 mL/min signify possible Chronic Kidney Disease.    Anion gap 6 5 - 15    Current Facility-Administered Medications  Medication Dose Route Frequency Provider Last Rate Last Dose  . 0.9 %  sodium chloride infusion   Intravenous Continuous Amin, Ankit Chirag, MD 100 mL/hr at 06/11/16 1204    . amoxicillin-clavulanate (AUGMENTIN) 875-125 MG per tablet 1 tablet  1 tablet Oral Q12H Amin, Ankit Chirag, MD   1 tablet at 06/11/16 1012  . diazepam (VALIUM) tablet 2 mg  2 mg Oral BID Javier Glazier, MD   2 mg at 06/11/16 0934  . enoxaparin (LOVENOX) injection 40 mg  40 mg Subcutaneous Q24H Lo Corine Shelter, MD   40 mg at 06/11/16 0919  . folic acid (FOLVITE) tablet 1 mg  1 mg Oral Daily Lo Corine Shelter, MD   1 mg at 06/11/16 0920  . multivitamin (PROSIGHT) tablet 1 tablet  1 tablet Oral Daily Javier Glazier, MD   1 tablet at 06/11/16 0920  . nicotine (NICODERM CQ - dosed in mg/24 hours) patch 14 mg  14 mg Transdermal Daily Javier Glazier, MD   14 mg at 06/11/16 0920  . thiamine (VITAMIN B-1) tablet 100 mg  100 mg Oral Daily Lo Corine Shelter, MD   100 mg at 06/11/16 0920    Musculoskeletal: Strength & Muscle Tone: decreased Gait & Station: unable to stand Patient leans: N/A  Psychiatric Specialty Exam: Physical ExamAs per history and physical  ROS patient mental status seems to be slowly improving and currently denies nausea, vomiting, abdomen pain, shortness of breath and chest pain. Patient continued to report generalized weakness, being tired ongoing depression secondary to drinking alcohol but denies withdrawal symptoms.  No  Fever-chills, No Headache, No changes with Vision or hearing, reports vertigo No problems swallowing food or Liquids, No Chest pain, Cough or Shortness of Breath, No Abdominal pain, No Nausea or Vommitting, Bowel movements are regular, No Blood in stool or Urine, No dysuria, No new skin rashes or bruises, No new joints pains-aches,  No new weakness, tingling, numbness in any extremity, No recent weight gain or loss, No polyuria, polydypsia or polyphagia,  A full 10 point Review of Systems was done, except as stated above, all other Review of Systems were negative.  Blood pressure 118/76, pulse 84, temperature 97.7 F (36.5 C), temperature source Oral, resp. rate (!) 24, height 6' 1" (1.854 m), weight 86.9 kg (191 lb 9.3 oz), SpO2 90 %.Body mass index is 25.28 kg/m.  General Appearance: Disheveled  Eye Contact:  Fair  Speech:  Slow  Volume:  Decreased  Mood:  Depressed and Worthless  Affect:  Constricted and Depressed  Thought Process:  Coherent and Goal Directed  Orientation:  Full (Time, Place, and Person)  Thought Content:  Rumination  Suicidal Thoughts:  Yes.  with intent/plan  Homicidal Thoughts:  No  Memory:  Immediate;   Good Recent;   Fair Remote;   Fair  Judgement:  Impaired  Insight:  Fair  Psychomotor Activity:  Decreased  Concentration:  Concentration: Fair and Attention Span: Fair  Recall:  Good  Fund of Knowledge:  Good  Language:  Good  Akathisia:  Negative  Handed:  Right  AIMS (if indicated):     Assets:  Communication Skills Desire for Improvement Financial Resources/Insurance Housing Leisure Time Resilience Social Support  ADL's:  Impaired  Cognition:  Impaired,  Mild  Sleep:        Treatment Plan Summary: 40 sodium male with history of paranoid schizophrenia, alcohol abuse with intoxication presented with status post intentional overdose of psychiatric medication and altered mental status.  Paranoid schizophrenia Alcohol abuse versus  intoxication Status post suicidal attempt by intentional drug overdose  Continue Ativan detox and CIWA protocol Continue safety sitter Patient may needs involuntary commitment if refuses acute psychiatric hospitalization Units social service to contact [facility] for the current medication treatment and also contact her collateral information from the daughter regarding his plans of relocating to [place] Recommended no psychotropic medication has recently overdosed his medications  Appreciate psychiatric consultation and follow up as clinically required Please contact [phone] or [phone] if needs further assistance  Disposition: Recommend psychiatric Inpatient admission when medically cleared. Supportive therapy provided about ongoing stressors.  Ambrose Finland, MD 06/11/2016 12:52 PM

## 2016-06-12 ENCOUNTER — Encounter (HOSPITAL_COMMUNITY): Payer: Self-pay | Admitting: *Deleted

## 2016-06-12 DIAGNOSIS — J9600 Acute respiratory failure, unspecified whether with hypoxia or hypercapnia: Secondary | ICD-10-CM

## 2016-06-12 NOTE — Progress Notes (Signed)
Leonard Guerrero TEAM 1 - Stepdown/ICU TEAM  Kaizer Dissinger  ZOX:[id number] DOB: March 05, 1975 DOA: 06/07/2016 PCP: No primary care provider on file.    Brief Narrative:  41 year old male with history of seizures, depression, and schizophrenia who was brought to the ER after he was found down by his roommates. He reported he overdosed on several of his home psychiatry medicines but didn't know exactly which ones. At the time of admission he was obtunded and unresponsive to narcan.  He was intubated, and eventually extubated on 06/09/2016. He was tx for a presumed aspiration PNA.  An EEG was negative for seizure activity.   Subjective: The patient is resting comfortably in bed.  He denies chest pain shortness breath fevers chills nausea or vomiting.  He is anxious to be moved out of the ICU.  He is anxious to gain access to a telephone.  He asked how soon he'll be able to leave the hospital.  I have explained him that my role is to clear him medically and that his ultimate disposition is up to the Psychiatrist.  Assessment & Plan:  Acute hypoxic respiratory failure Due to obtundation plus aspiration pneumonia - much improved - weaned to room air - is now afebrile with normal white blood cells - medically stable   Acute metabolic encephalopathy Due to polysubstance overdose - EEG without evidence of seizure - mental status now back to baseline   Aspiration pneumonia - Strep pneum on culture  to complete a seven-day course of antibiotic therapy - now afebrile w/ normal WBC and no resp distress   Suicidal ideation - intentional prescription drug overdose Psychiatry has recommended inpatient psychiatric treatment - pt is to be involuntarily committed if he does not agree or if he attempts to leave as per Psychiatry   Alcohol abuse Likely driven by uncontrolled psych illness - tx the underlying cause and counsel on abstinence  Moderate pulmonary hypertension Noted on TTE 5/7 - 42mm Hg - etiology unclear -  will need outpatient follow-up  Paranoid Schizophrenia As per Psych  DVT prophylaxis: Lovenox Code Status: FULL CODE Family Communication: spoke w/ pt and family at bedside  Disposition Plan: transfer to med bed - d/c to inpatient psych facility as soon as CSW can arrange   Consultants:  Neurology PCCM Psych   Procedures: EEG 5/7 Intubated/ventilated 5/5 > 5/7  Antimicrobials:  Unasyn  5/6 > 5/7 Augmentin 5/8 >  Objective: Blood pressure (!) 114/102, pulse (!) 58, temperature 100 F (37.8 C), temperature source Oral, resp. rate (!) 21, height 6\' 1"  (1.854 m), weight 87.9 kg (193 lb 12.6 oz), SpO2 97 %.  Intake/Output Summary (Last 24 hours) at 06/12/16 0827 Last data filed at 06/12/16 0800  Gross per 24 hour  Intake             3140 ml  Output             4400 ml  Net            -1260 ml   Filed Weights   06/10/16 0400 06/11/16 0500 06/12/16 0500  Weight: 87.1 kg (192 lb 0.3 oz) 86.9 kg (191 lb 9.3 oz) 87.9 kg (193 lb 12.6 oz)    Examination: General: No acute respiratory distress Lungs: Clear to auscultation bilaterally without wheezes or crackles Cardiovascular: Regular rate and rhythm without murmur gallop or rub normal S1 and S2 Abdomen: Nontender, nondistended, soft, bowel sounds positive, no rebound, no ascites, no appreciable mass Extremities: No significant cyanosis, clubbing, or  edema bilateral lower extremities  CBC:  Recent Labs Lab 06/07/16 2149  06/08/16 0230 06/08/16 0559 06/09/16 0237 06/10/16 0318 06/11/16 0236  WBC 12.0*  --  11.6* 12.4* 16.0* 10.9* 7.6  NEUTROABS 8.4*  --   --   --   --   --   --   HGB 12.9*  < > 12.5* 12.9* 12.8* 11.8* 12.1*  HCT 38.1*  < > 37.8* 39.3 38.7* 35.1* 36.2*  MCV 93.8  --  95.0 95.4 95.1 93.9 93.5  PLT 174  --  134* 131* 146* 127* 134*  < > = values in this interval not displayed. Basic Metabolic Panel:  Recent Labs Lab 06/08/16 0230 06/08/16 0559 06/08/16 1627 06/09/16 0237 06/10/16 0318  06/11/16 0236  NA  --  139 140 140 138 139  K  --  3.7 4.0 3.8 3.1* 3.6  CL  --  110 111 109 108 109  CO2  --  20* 19* 23 23 24   GLUCOSE  --  187* 65 91 107* 90  BUN  --  9 11 9  <5* 5*  CREATININE 1.00 0.96 1.09 0.96 0.82 0.88  CALCIUM  --  8.4* 8.8* 8.7* 8.1* 8.4*  MG 1.9  --   --   --   --  1.7  PHOS 3.3  --   --   --   --  4.7*   GFR: Estimated Creatinine Clearance: 123.6 mL/min (by C-G formula based on SCr of 0.88 mg/dL).  Liver Function Tests:  Recent Labs Lab 06/07/16 2149 06/08/16 0559 06/08/16 1124 06/09/16 0237 06/10/16 0318 06/11/16 0236  AST 33 38 36 27 21  --   ALT 26 26 26 23 18   --   ALKPHOS 52 53 55 53 48  --   BILITOT 0.9 1.2 1.3* 0.9 0.9  --   PROT 6.2* 5.4* 5.5* 5.6* 4.9*  --   ALBUMIN 3.7 3.2* 3.3* 3.1* 2.8* 2.9*    Recent Labs Lab 06/08/16 0230  LIPASE 25    Recent Labs Lab 06/07/16 2314  AMMONIA 51*    Coagulation Profile:  Recent Labs Lab 06/08/16 0230  INR 1.16    Cardiac Enzymes:  Recent Labs Lab 06/08/16 0230 06/08/16 0559 06/08/16 1124 06/08/16 1627  CKTOTAL  --   --   --  131  CKMB  --   --   --  3.1  TROPONINI <0.03 <0.03 <0.03  --     CBG:  Recent Labs Lab 06/09/16 1603 06/09/16 2054 06/10/16 0033 06/10/16 0410 06/10/16 0849  GLUCAP 96 87 98 115* 103*    Recent Results (from the past 240 hour(s))  MRSA PCR Screening     Status: None   Collection Time: 06/08/16  1:42 AM  Result Value Ref Range Status   MRSA by PCR NEGATIVE NEGATIVE Final    Comment:        The GeneXpert MRSA Assay (FDA approved for NASAL specimens only), is one component of a comprehensive MRSA colonization surveillance program. It is not intended to diagnose MRSA infection nor to guide or monitor treatment for MRSA infections.   Culture, blood (routine x 2)     Status: None (Preliminary result)   Collection Time: 06/08/16  2:36 AM  Result Value Ref Range Status   Specimen Description BLOOD LEFT HAND  Final   Special  Requests   Final    BOTTLES DRAWN AEROBIC AND ANAEROBIC Blood Culture results may not be optimal due to an excessive volume  of blood received in culture bottles   Culture NO GROWTH 3 DAYS  Final   Report Status PENDING  Incomplete  Culture, blood (routine x 2)     Status: None (Preliminary result)   Collection Time: 06/08/16  2:48 AM  Result Value Ref Range Status   Specimen Description BLOOD RIGHT HAND  Final   Special Requests   Final    BOTTLES DRAWN AEROBIC ONLY Blood Culture adequate volume   Culture NO GROWTH 3 DAYS  Final   Report Status PENDING  Incomplete  Culture, respiratory (NON-Expectorated)     Status: None   Collection Time: 06/08/16 11:53 AM  Result Value Ref Range Status   Specimen Description TRACHEAL ASPIRATE  Final   Special Requests Normal  Final   Gram Stain   Final    RARE WBC PRESENT, PREDOMINANTLY PMN RARE SQUAMOUS EPITHELIAL CELLS PRESENT ABUNDANT GRAM POSITIVE COCCI IN PAIRS FEW GRAM POSITIVE RODS    Culture ABUNDANT STREPTOCOCCUS PNEUMONIAE  Final   Report Status 06/11/2016 FINAL  Final   Organism ID, Bacteria STREPTOCOCCUS PNEUMONIAE  Final      Susceptibility   Streptococcus pneumoniae - MIC*    ERYTHROMYCIN <=0.12 SENSITIVE Sensitive     LEVOFLOXACIN 0.5 SENSITIVE Sensitive     PENICILLIN (meningitis) <=0.06 SENSITIVE Sensitive     PENICILLIN (non-meningitis) <=0.06 SENSITIVE Sensitive     CEFTRIAXONE (non-meningitis) <=0.12 SENSITIVE Sensitive     CEFTRIAXONE (meningitis) <=0.12 SENSITIVE Sensitive     * ABUNDANT STREPTOCOCCUS PNEUMONIAE     Scheduled Meds: . amoxicillin-clavulanate  1 tablet Oral Q12H  . diazepam  2 mg Oral BID  . enoxaparin (LOVENOX) injection  40 mg Subcutaneous Q24H  . folic acid  1 mg Oral Daily  . multivitamin  1 tablet Oral Daily  . nicotine  14 mg Transdermal Daily  . thiamine  100 mg Oral Daily   Continuous Infusions: . sodium chloride 100 mL/hr at 06/11/16 2058     LOS: 5 days   Lonia BloodJeffrey T. Rise Traeger,  MD [facility] Office  [phone] Pager - Text Page per Loretha StaplerAmion as per below:  On-Call/Text Page:      [email or web address]      password [email or web address]  If 7PM-7AM, please contact night-coverage [email or web address] Password [email or web address] 06/12/2016, 8:27 AM

## 2016-06-12 NOTE — Progress Notes (Signed)
Patient transferred to 5M04, alert and oriented x4, follows commands. Suicide precautions set up in room. Sitter at bedside. Will continue to monitor.

## 2016-06-12 NOTE — Care Management Note (Signed)
Case Management Note  Patient Details  Name: Leonard Guerrero MRN: [id number] Date of Birth: 1975/06/15  Subjective/Objective:  Pt admitted with intentional overdose. He is from home with roommates.                  Action/Plan: Plan is for inpatient psychiatric facility at discharge. CM following.  Expected Discharge Date:                  [facility]  [facility]  In-House Referral:     Discharge planning Services  CM Consult  Post Acute Care Choice:    Choice offered to:     DME Arranged:    DME Agency:     HH Arranged:    HH Agency:     Status of Service:  In process, will continue to follow  If discussed at Long Length of Stay Meetings, dates discussed:    Additional Comments:  Kermit BaloKelli F Zakariah Dejarnette, RN 06/12/2016, 1:12 PM

## 2016-06-13 ENCOUNTER — Inpatient Hospital Stay (HOSPITAL_COMMUNITY)
Admission: AD | Admit: 2016-06-13 | Discharge: 2016-06-19 | DRG: 885 | Disposition: A | Discharge status: Home / Self Care | Payer: Medicaid Other | Attending: Psychiatry | Admitting: Psychiatry

## 2016-06-13 ENCOUNTER — Encounter (HOSPITAL_COMMUNITY): Payer: Self-pay | Admitting: *Deleted

## 2016-06-13 DIAGNOSIS — F339 Major depressive disorder, recurrent, unspecified: Secondary | ICD-10-CM | POA: Diagnosis not present

## 2016-06-13 DIAGNOSIS — F200 Paranoid schizophrenia: Secondary | ICD-10-CM | POA: Diagnosis present

## 2016-06-13 DIAGNOSIS — F10920 Alcohol use, unspecified with intoxication, uncomplicated: Secondary | ICD-10-CM | POA: Diagnosis not present

## 2016-06-13 DIAGNOSIS — F203 Undifferentiated schizophrenia: Secondary | ICD-10-CM | POA: Diagnosis not present

## 2016-06-13 DIAGNOSIS — F1020 Alcohol dependence, uncomplicated: Secondary | ICD-10-CM | POA: Diagnosis present

## 2016-06-13 DIAGNOSIS — F1410 Cocaine abuse, uncomplicated: Secondary | ICD-10-CM | POA: Diagnosis present

## 2016-06-13 DIAGNOSIS — F17210 Nicotine dependence, cigarettes, uncomplicated: Secondary | ICD-10-CM | POA: Diagnosis present

## 2016-06-13 DIAGNOSIS — R7989 Other specified abnormal findings of blood chemistry: Secondary | ICD-10-CM | POA: Clinically undetermined

## 2016-06-13 DIAGNOSIS — J9601 Acute respiratory failure with hypoxia: Secondary | ICD-10-CM | POA: Diagnosis not present

## 2016-06-13 DIAGNOSIS — F1420 Cocaine dependence, uncomplicated: Secondary | ICD-10-CM | POA: Diagnosis not present

## 2016-06-13 DIAGNOSIS — F10230 Alcohol dependence with withdrawal, uncomplicated: Secondary | ICD-10-CM | POA: Diagnosis present

## 2016-06-13 DIAGNOSIS — Z818 Family history of other mental and behavioral disorders: Secondary | ICD-10-CM

## 2016-06-13 DIAGNOSIS — F329 Major depressive disorder, single episode, unspecified: Secondary | ICD-10-CM | POA: Diagnosis present

## 2016-06-13 DIAGNOSIS — G9340 Encephalopathy, unspecified: Secondary | ICD-10-CM | POA: Diagnosis not present

## 2016-06-13 DIAGNOSIS — R4182 Altered mental status, unspecified: Secondary | ICD-10-CM | POA: Diagnosis not present

## 2016-06-13 DIAGNOSIS — J9600 Acute respiratory failure, unspecified whether with hypoxia or hypercapnia: Secondary | ICD-10-CM | POA: Diagnosis not present

## 2016-06-13 LAB — CULTURE, BLOOD (ROUTINE X 2)
CULTURE: NO GROWTH
CULTURE: NO GROWTH
SPECIAL REQUESTS: ADEQUATE

## 2016-06-13 MED ORDER — ALUM & MAG HYDROXIDE-SIMETH 200-200-20 MG/5ML PO SUSP: 30.0000 mL | ORAL | Status: DC | PRN

## 2016-06-13 MED ORDER — HYDROXYZINE HCL 25 MG PO TABS
25.0000 mg | ORAL_TABLET | Freq: Four times a day (QID) | ORAL | Status: DC | PRN
  Administered 2016-06-13 – 2016-06-18 (×5): 25 mg via ORAL
  Filled 2016-06-13 (×6): qty 1

## 2016-06-13 MED ORDER — TRAZODONE HCL 50 MG PO TABS
50.0000 mg | ORAL_TABLET | Freq: Every evening | ORAL | Status: DC | PRN
  Administered 2016-06-13 – 2016-06-18 (×5): 50 mg via ORAL
  Filled 2016-06-13 (×6): qty 1

## 2016-06-13 MED ORDER — AMOXICILLIN-POT CLAVULANATE 875-125 MG PO TABS: 1.0000 | ORAL_TABLET | Freq: Two times a day (BID) | ORAL | 0 refills | Status: DC

## 2016-06-13 MED ORDER — MAGNESIUM HYDROXIDE 400 MG/5ML PO SUSP
30.0000 mL | Freq: Every day | ORAL | Status: DC | PRN
  Administered 2016-06-16: 30 mL via ORAL
  Filled 2016-06-13: qty 30

## 2016-06-13 MED ORDER — NICOTINE POLACRILEX 2 MG MT GUM
2.0000 mg | CHEWING_GUM | OROMUCOSAL | Status: DC | PRN
  Administered 2016-06-14 – 2016-06-19 (×9): 2 mg via ORAL
  Filled 2016-06-13 (×4): qty 1

## 2016-06-13 MED ORDER — THIAMINE HCL 100 MG PO TABS: 100.0000 mg | ORAL_TABLET | Freq: Every day | ORAL | 1 refills | Status: DC

## 2016-06-13 MED ORDER — ACETAMINOPHEN 325 MG PO TABS: 650.0000 mg | ORAL_TABLET | Freq: Four times a day (QID) | ORAL | Status: DC | PRN

## 2016-06-13 MED ORDER — FOLIC ACID 1 MG PO TABS: 1.0000 mg | ORAL_TABLET | Freq: Every day | ORAL | 1 refills | Status: DC

## 2016-06-13 MED ORDER — NICOTINE 14 MG/24HR TD PT24: 14.0000 mg | MEDICATED_PATCH | Freq: Every day | TRANSDERMAL | 0 refills | Status: DC

## 2016-06-13 NOTE — Progress Notes (Signed)
Patient in bed sleeping, no signs of distress noted.  No changes in assessment noted.  Will continue to monitor and report changes as needed.  Sitter at bedside.

## 2016-06-13 NOTE — Clinical Social Work Note (Signed)
Pt ready for discharge today and [facility] . Pt IVC'd. CSW called Sherrif's office [phone]) and spoke with Officer O'Brien to arrange transport. Officer stated transport would be provided by [employer] [employer], as they served the paperwork and the transport is within the city. CSW spoke with Elnita Maxwellheryl at [employer] [phone]) and arranged transportation with [employer].Med necessity transport form, IVC paperwork, face sheet and d/c summary on chart. Room and report in treatment team sticky note. RN aware. CSW signing off as no further CSW needs identified. Please reconsult if new social work needs arise.   Corlis HoveJeneya Jennah Guerrero, LCSWA, LCASA Clinical Social Work [phone]

## 2016-06-13 NOTE — Progress Notes (Signed)
[facility] and was [place] to [employer] [employer].

## 2016-06-13 NOTE — Care Management Note (Signed)
Case Management Note  Patient Details  Name: Leonard Guerrero MRN: [id number] Date of Birth: 03-24-1975  Subjective/Objective:                    Action/Plan: [facility] today. No further needs per CM.   Expected Discharge Date:  06/13/16               [facility]  [facility]  In-House Referral:  Clinical Social Work  Discharge planning Services  CM Consult  Post Acute Care Choice:    Choice offered to:     DME Arranged:    DME Agency:     HH Arranged:    HH Agency:     Status of Service:  Completed, signed off  If discussed at [employer] Meetings, dates discussed:    Additional Comments:  Kermit BaloKelli F Kardell Virgil, RN 06/13/2016, 1:26 PM

## 2016-06-13 NOTE — Progress Notes (Signed)
Patient slept through the night waking twice to void.  No changes in assessment, signs of distress or discomfort noted.  Sitter at bedside.  Will continue to monitor.

## 2016-06-13 NOTE — Discharge Summary (Signed)
Physician Discharge Summary  Leonard ComfortRicky Guerrero MRN: [id number] DOB/AGE: Oct 31, 1975 41 y.o.  PCP: No primary care provider on file.   Admit date: 06/07/2016 Discharge date: 06/13/2016  Discharge Diagnoses:    Active Problems:   Encephalopathy acute   Acute encephalopathy   Acute respiratory failure (HCC)   Altered mental status   Alcoholic intoxication without complication (HCC)    Follow-up recommendations Follow-up with PCP in 3-5 days , including all  additional recommended appointments as below Follow-up CBC, CMP in 3-5 days       Current Discharge Medication List    START taking these medications   Details  amoxicillin-clavulanate (AUGMENTIN) 875-125 MG tablet Take 1 tablet by mouth every 12 (twelve) hours. Qty: 10 tablet, Refills: 0    folic acid (FOLVITE) 1 MG tablet Take 1 tablet (1 mg total) by mouth daily. Qty: 30 tablet, Refills: 1    nicotine (NICODERM CQ - DOSED IN MG/24 HOURS) 14 mg/24hr patch Place 1 patch (14 mg total) onto the skin daily. Qty: 28 patch, Refills: 0    thiamine 100 MG tablet Take 1 tablet (100 mg total) by mouth daily. Qty: 30 tablet, Refills: 1      CONTINUE these medications which have NOT CHANGED   Details  Paliperidone Palmitate (INVEGA TRINZA) 546 MG/1.75ML SUSP Inject 1.75 mLs into the muscle every 3 (three) months.         Discharge Condition: Stable   Discharge Instructions Get Medicines reviewed and adjusted: Please take all your medications with you for your next visit with your Primary MD  Please request your Primary MD to go over all hospital tests and procedure/radiological results at the follow up, please ask your Primary MD to get all Hospital records sent to his/her office.  If you experience worsening of your admission symptoms, develop shortness of breath, life threatening emergency, suicidal or homicidal thoughts you must seek medical attention immediately by calling 911 or calling your MD immediately if  symptoms less severe.  You must read complete instructions/literature along with all the possible adverse reactions/side effects for all the Medicines you take and that have been prescribed to you. Take any new Medicines after you have completely understood and accpet all the possible adverse reactions/side effects.   Do not drive when taking Pain medications.   Do not take more than prescribed Pain, Sleep and Anxiety Medications  Special Instructions: If you have smoked or chewed Tobacco in the last 2 yrs please stop smoking, stop any regular Alcohol and or any Recreational drug use.  Wear Seat belts while driving.  Please note  You were cared for by a hospitalist during your hospital stay. Once you are discharged, your primary care physician will handle any further medical issues. Please note that NO REFILLS for any discharge medications will be authorized once you are discharged, as it is imperative that you return to your primary care physician (or establish a relationship with a primary care physician if you do not have one) for your aftercare needs so that they can reassess your need for medications and monitor your lab values.  Discharge Instructions    Diet - low sodium heart healthy    Complete by:  As directed    Increase activity slowly    Complete by:  As directed        No Known Allergies    Disposition: Behavioral health   Consults:  Critical care    Significant Diagnostic Studies:  Ct Head Wo Contrast  Result  Date: 06/07/2016 CLINICAL DATA:  Possible overdose EXAM: CT HEAD WITHOUT CONTRAST CT CERVICAL SPINE WITHOUT CONTRAST TECHNIQUE: Multidetector CT imaging of the head and cervical spine was performed following the standard protocol without intravenous contrast. Multiplanar CT image reconstructions of the cervical spine were also generated. COMPARISON:  None. FINDINGS: CT HEAD FINDINGS BRAIN: The ventricles and sulci are normal. No intraparenchymal  hemorrhage, mass effect nor midline shift. No acute large vascular territory infarcts. No abnormal extra-axial fluid collections. Basal cisterns are midline and not effaced. No acute cerebellar abnormality. VASCULAR: Unremarkable. SKULL/SOFT TISSUES: No skull fracture. No significant soft tissue swelling. ORBITS/SINUSES: The included ocular globes and orbital contents are normal.Moderate ethmoid and mild to moderate frontal sinus mucosal thickening. OTHER: None. CT CERVICAL SPINE FINDINGS ALIGNMENT: Vertebral bodies in alignment. Maintained lordosis. SKULL BASE AND VERTEBRAE: Cervical vertebral bodies and posterior elements are intact. Intervertebral disc heights preserved. No destructive bony lesions. C1-2 articulation maintained. SOFT TISSUES AND SPINAL CANAL: Normal. DISC LEVELS: No significant osseous canal stenosis or neural foraminal narrowing. UPPER CHEST: Lung apices are clear. OTHER: Endotracheal tube and gastric tubes are partially imaged along the course of the upper trachea and visualized esophagus respectively. IMPRESSION: 1. No acute intracranial abnormality. The visualized paranasal sinuses are notable for mild-to-moderate mucosal thickening of the ethmoid and frontal sinuses. 2. No acute posttraumatic cervical spine fracture or subluxation. Electronically Signed   By: Tollie Eth M.D.   On: 06/07/2016 22:40   Ct Cervical Spine Wo Contrast  Result Date: 06/07/2016 CLINICAL DATA:  Possible overdose EXAM: CT HEAD WITHOUT CONTRAST CT CERVICAL SPINE WITHOUT CONTRAST TECHNIQUE: Multidetector CT imaging of the head and cervical spine was performed following the standard protocol without intravenous contrast. Multiplanar CT image reconstructions of the cervical spine were also generated. COMPARISON:  None. FINDINGS: CT HEAD FINDINGS BRAIN: The ventricles and sulci are normal. No intraparenchymal hemorrhage, mass effect nor midline shift. No acute large vascular territory infarcts. No abnormal extra-axial  fluid collections. Basal cisterns are midline and not effaced. No acute cerebellar abnormality. VASCULAR: Unremarkable. SKULL/SOFT TISSUES: No skull fracture. No significant soft tissue swelling. ORBITS/SINUSES: The included ocular globes and orbital contents are normal.Moderate ethmoid and mild to moderate frontal sinus mucosal thickening. OTHER: None. CT CERVICAL SPINE FINDINGS ALIGNMENT: Vertebral bodies in alignment. Maintained lordosis. SKULL BASE AND VERTEBRAE: Cervical vertebral bodies and posterior elements are intact. Intervertebral disc heights preserved. No destructive bony lesions. C1-2 articulation maintained. SOFT TISSUES AND SPINAL CANAL: Normal. DISC LEVELS: No significant osseous canal stenosis or neural foraminal narrowing. UPPER CHEST: Lung apices are clear. OTHER: Endotracheal tube and gastric tubes are partially imaged along the course of the upper trachea and visualized esophagus respectively. IMPRESSION: 1. No acute intracranial abnormality. The visualized paranasal sinuses are notable for mild-to-moderate mucosal thickening of the ethmoid and frontal sinuses. 2. No acute posttraumatic cervical spine fracture or subluxation. Electronically Signed   By: Tollie Eth M.D.   On: 06/07/2016 22:40   Dg Chest Port 1 View  Result Date: 06/09/2016 CLINICAL DATA:  Shortness of breath and seizure. EXAM: PORTABLE CHEST 1 VIEW COMPARISON:  06/07/2016 chest radiograph FINDINGS: An endotracheal tube is identified with tip 4.5 cm above the carina. An NG tube enters the stomach with tip off the field of view. Cardiomegaly and mild pulmonary vascular congestion noted. There is no evidence of focal airspace disease, pulmonary edema, suspicious pulmonary nodule/mass, pleural effusion, or pneumothorax. No acute bony abnormalities are identified. IMPRESSION: Cardiomegaly with mild pulmonary vascular congestion. Support apparatus as described. Electronically  Signed   By: Harmon Pier M.D.   On: 06/09/2016 10:25    Dg Chest Portable 1 View  Result Date: 06/07/2016 CLINICAL DATA:  Drug overdose.  Post intubation. EXAM: PORTABLE CHEST 1 VIEW COMPARISON:  None. FINDINGS: An ET tube is identified, terminating 6 cm above the carina in good position. The distal NG tube is not well seen. No pneumothorax. The right lung is clear. Haziness projects over the left lung. No nodule or mass. Cardiomegaly. The hila and mediastinum are unremarkable. IMPRESSION: 1. The ETT is in good position. The distal NG tube is not well visualized. 2. Haziness over the left chest could be due to patient rotation. Asymmetric edema or less likely a developing infiltrate is not completely excluded. Electronically Signed   By: Gerome Sam III M.D   On: 06/07/2016 22:12   Dg Abd Portable 1v  Result Date: 06/08/2016 CLINICAL DATA:  OG tube placement vomiting EXAM: PORTABLE ABDOMEN - 1 VIEW COMPARISON:  None. FINDINGS: Left lung base is clear. Esophageal tube tip overlies the proximal stomach, side-port appears to be at the GE junction. IMPRESSION: Esophageal tube tip overlies the proximal stomach, side-port projects over GE junction, further advancement by at least 5 cm is suggested for more optimal positioning. These results will be called to the ordering clinician or representative by the Radiologist Assistant, and communication documented in the PACS or zVision Dashboard. Electronically Signed   By: Jasmine Pang M.D.   On: 06/08/2016 03:22        Filed Weights   06/11/16 0500 06/12/16 0500 06/13/16 0506  Weight: 86.9 kg (191 lb 9.3 oz) 87.9 kg (193 lb 12.6 oz) 83.6 kg (184 lb 3.2 oz)     Microbiology: Recent Results (from the past 240 hour(s))  MRSA PCR Screening     Status: None   Collection Time: 06/08/16  1:42 AM  Result Value Ref Range Status   MRSA by PCR NEGATIVE NEGATIVE Final    Comment:        The GeneXpert MRSA Assay (FDA approved for NASAL specimens only), is one component of a comprehensive MRSA  colonization surveillance program. It is not intended to diagnose MRSA infection nor to guide or monitor treatment for MRSA infections.   Culture, blood (routine x 2)     Status: None   Collection Time: 06/08/16  2:36 AM  Result Value Ref Range Status   Specimen Description BLOOD LEFT HAND  Final   Special Requests   Final    BOTTLES DRAWN AEROBIC AND ANAEROBIC Blood Culture results may not be optimal due to an excessive volume of blood received in culture bottles   Culture NO GROWTH 5 DAYS  Final   Report Status 06/13/2016 FINAL  Final  Culture, blood (routine x 2)     Status: None   Collection Time: 06/08/16  2:48 AM  Result Value Ref Range Status   Specimen Description BLOOD RIGHT HAND  Final   Special Requests   Final    BOTTLES DRAWN AEROBIC ONLY Blood Culture adequate volume   Culture NO GROWTH 5 DAYS  Final   Report Status 06/13/2016 FINAL  Final  Culture, respiratory (NON-Expectorated)     Status: None   Collection Time: 06/08/16 11:53 AM  Result Value Ref Range Status   Specimen Description TRACHEAL ASPIRATE  Final   Special Requests Normal  Final   Gram Stain   Final    RARE WBC PRESENT, PREDOMINANTLY PMN RARE SQUAMOUS EPITHELIAL CELLS PRESENT ABUNDANT GRAM  POSITIVE COCCI IN PAIRS FEW GRAM POSITIVE RODS    Culture ABUNDANT STREPTOCOCCUS PNEUMONIAE  Final   Report Status 06/11/2016 FINAL  Final   Organism ID, Bacteria STREPTOCOCCUS PNEUMONIAE  Final      Susceptibility   Streptococcus pneumoniae - MIC*    ERYTHROMYCIN <=0.12 SENSITIVE Sensitive     LEVOFLOXACIN 0.5 SENSITIVE Sensitive     PENICILLIN (meningitis) <=0.06 SENSITIVE Sensitive     PENICILLIN (non-meningitis) <=0.06 SENSITIVE Sensitive     CEFTRIAXONE (non-meningitis) <=0.12 SENSITIVE Sensitive     CEFTRIAXONE (meningitis) <=0.12 SENSITIVE Sensitive     * ABUNDANT STREPTOCOCCUS PNEUMONIAE       Blood Culture    Component Value Date/Time   SDES TRACHEAL ASPIRATE 06/08/2016 1153   SPECREQUEST  Normal 06/08/2016 1153   CULT ABUNDANT STREPTOCOCCUS PNEUMONIAE 06/08/2016 1153   REPTSTATUS 06/11/2016 FINAL 06/08/2016 1153      Labs: No results found for this or any previous visit (from the past 48 hour(s)).   Lipid Panel     Component Value Date/Time   TRIG 74 06/08/2016 0230     No results found for: HGBA1C   Lab Results  Component Value Date   CREATININE 0.88 06/11/2016     HPI :   41 y.o. male with known history of seizure disorder. Found down by roommate. Roommate reportedly told EMS that he had seen patient convulsing today when he was found. Narcan was administered without response. Patient's blood glucose was normal. Numerous pill bottles were found in the home some of which were empty and somewhat which were full. Upon arrival to the emergency department his mental status progressively improved but then he became nauseous and developed intractable vomiting. Patient was subsequently intubated for airway protection. Extubated 5/6.An EEG was negative for seizure activity  HOSPITAL COURSE:    Acute hypoxic respiratory failure Due to obtundation plus aspiration pneumonia - much improved - weaned to room air - is now afebrile with normal white blood cells - medically stable   Acute metabolic encephalopathy Due to polysubstance overdose - EEG without evidence of seizure - mental status now back to baseline   Aspiration pneumonia - Strep pneum on culture  Continue Augmentin for another 5 days and then discontinue - now afebrile w/ normal WBC and no resp distress   Suicidal ideation - intentional prescription drug overdose Psychiatry has recommended inpatient psychiatric treatment - pt is to be involuntarily committed if he does not agree or if he attempts to leave as per Psychiatry   Alcohol abuse Likely driven by uncontrolled psych illness - tx the underlying cause and counsel on abstinence. 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 rate 18, height 6\' 1"  (1.854 m), weight 83.6 kg (184 lb 3.2 oz), SpO2 92 %.   General: No acute respiratory distress Lungs: Clear to auscultation bilaterally without wheezes or crackles Cardiovascular: Regular rate and rhythm without murmur gallop or rub normal S1 and S2 Abdomen: Nontender, nondistended, soft, bowel sounds positive, no rebound, no ascites, no appreciable mass Extremities: No significant cyanosis, clubbing, or edema bilateral lower extremities    Signed: Richarda Overlie 06/13/2016, 12:28 PM        Time spent >45 mins

## 2016-06-13 NOTE — Progress Notes (Signed)
Patient ID: Zeb ComfortRicky Irmen, male   DOB: 24-Dec-1975, 41 y.o.   MRN: [id number] D: Client in room most of the shift, reports anxiety "6" of 10. Client says of seizure "If I had one that was the first time I ever had one, ain't had one since" A: Writer provided emotional support encouraged client to report any concerns. Medications reviewed, administered as ordered. Staff will monitor q6415min for safety. R:Client is safe on the unit.

## 2016-06-13 NOTE — Progress Notes (Signed)
Admission note:  Patient is a 41 yo male that has been in the Critical Care Unit since the 4th of May.  Patient has a hx of seizures and was found by a roommate unresponsive in the home.  EMS came and roommate informed them that he observed the patient convulsing.  Numerous pill bottles were found with some empty.  Upon EMS arrival, patient was given narcan with no response.  Patient had been taking lamictal, gabapentin and propanolol.  Patient states that he does not have any memory of that day.  He states he was drinking and is unaware of how much he drank.  He states he normally drinks 3 to 4 40 oz. Beers per day.  Patient states he has blackouts and has been drinking since he was 41 yo.  [place] roommates in a house. He has family in the area and his mother is supportive.  Patient has been sober since Feb. 15th and relapsed this past Friday.  This is his first psyche admission.  He denies any withdrawals from alcohol.  He states he has a hx of cocaine abuse and has not had any in "a while."  UDS was positive for cocaine.  Patient is followed [facility] and receives an invega shot every 3 months.  He states he is due to get another shot on the 24th of May.  He denies any suicidal ideation.  He denies HI and is not responding to internal stimuli.  Patient states he is on disability.  He normally takes his medication as ordered.  Patient was oriented to unit and room.

## 2016-06-13 NOTE — Tx Team (Signed)
Initial Treatment Plan 06/13/2016 2:45 PM Leonard ComfortRicky Hendrie WUJ:[id number]    PATIENT STRESSORS: Educational concerns Financial difficulties Occupational concerns Substance abuse   PATIENT STRENGTHS: Average or above average intelligence Capable of independent living Communication skills Physical Health Supportive family/friends   PATIENT IDENTIFIED PROBLEMS: Depression  Alcohol abuse  Suicidal Risk  Ineffective coping skills               DISCHARGE CRITERIA:  Improved stabilization in mood, thinking, and/or behavior Medical problems require only outpatient monitoring Motivation to continue treatment in a less acute level of care Reduction of life-threatening or endangering symptoms to within safe limits Withdrawal symptoms are absent or subacute and managed without 24-hour nursing intervention  PRELIMINARY DISCHARGE PLAN: Attend 12-step recovery group Outpatient therapy Return to previous living arrangement  PATIENT/FAMILY INVOLVEMENT: This treatment plan has been presented to and reviewed with the patient, Leonard Guerrero.  The patient and family have been given the opportunity to ask questions and make suggestions.  Cranford MonBeaudry, Anedra Penafiel Evans, RN 06/13/2016, 2:45 PM

## 2016-06-13 NOTE — Progress Notes (Signed)
[facility] and [facility] is waiting for transportation.

## 2016-06-14 DIAGNOSIS — G9340 Encephalopathy, unspecified: Secondary | ICD-10-CM

## 2016-06-14 DIAGNOSIS — F339 Major depressive disorder, recurrent, unspecified: Secondary | ICD-10-CM

## 2016-06-14 DIAGNOSIS — J9600 Acute respiratory failure, unspecified whether with hypoxia or hypercapnia: Secondary | ICD-10-CM

## 2016-06-14 DIAGNOSIS — F10920 Alcohol use, unspecified with intoxication, uncomplicated: Secondary | ICD-10-CM

## 2016-06-14 DIAGNOSIS — R4182 Altered mental status, unspecified: Secondary | ICD-10-CM

## 2016-06-14 MED ORDER — PALIPERIDONE PALMITATE 546 MG/1.75ML IM SUSP: 546.0000 mg | INTRAMUSCULAR | Status: DC

## 2016-06-14 MED ORDER — PALIPERIDONE PALMITATE 546 MG/1.75ML IM SUSP: 1.7500 mL | INTRAMUSCULAR | Status: DC

## 2016-06-14 MED ORDER — FOLIC ACID 1 MG PO TABS
1.0000 mg | ORAL_TABLET | Freq: Every day | ORAL | Status: DC
  Administered 2016-06-14 – 2016-06-19 (×6): 1 mg via ORAL
  Filled 2016-06-14 (×8): qty 1

## 2016-06-14 MED ORDER — THIAMINE HCL 100 MG PO TABS
100.0000 mg | ORAL_TABLET | Freq: Every day | ORAL | Status: DC
  Administered 2016-06-14 – 2016-06-18 (×5): 100 mg via ORAL
  Filled 2016-06-14 (×8): qty 1

## 2016-06-14 NOTE — [facility] Counselor (Signed)
Adult Comprehensive Assessment  Patient ID: Leonard Guerrero, male   DOB: 02-04-1976, 41 y.o.   MRN: [id number]  Information Source: Information source: Patient  Current Stressors:  Educational / Learning stressors: 9th grade-dropped out due to getting in fights Employment / Job issues: on disability currently. unemployed Family Relationships: close to oldest daughter and his mother; no relationship with father or five other children Museum/gallery curator / Lack of resources (include bankruptcy): disability income; [employer] / Lack of housing: plans to move in with his sister in [place], [place] at discharge. was living with 2 roomates in [place] "It was a party house." Physical health (include injuries & life threatening diseases): none identified Social relationships: few friends in community; mother is biggest local support. oldest daughter is supportive. divorced x2 Substance abuse: relapsed on alcohol last Friday; also relapsed on cocaine-UDS positive but pt does not remember using. prior, pt has been 2 years sober Bereavement / Loss: none identified.   Living/Environment/Situation:  Living Arrangements: Non-relatives/Friends Living conditions (as described by patient or guardian): pt has been living with 2 roomates in [place] for the past year How long has patient lived in current situation?: one year  What is atmosphere in current home: Chaotic, Dangerous, Temporary  Family History:  Marital status: Divorced Divorced, when?: 14 years ago, pt divorced his 2nd wife.  What types of issues is patient dealing with in the relationship?: alcoholism Additional relationship information: n/a  Are you sexually active?: Yes What is your sexual orientation?: heterosexual Has your sexual activity been affected by drugs, alcohol, medication, or emotional stress?: n/a  Does patient have children?: Yes How many children?: 6 How is patient's relationship with their children?: 3 boys; 3 girls. "I  don't have a relationship with any of them but my oldest daughter. "I'm moving in with her in [facility] when I leave the hospital."  Childhood History:  By whom was/is the patient raised?: Mother, Grandparents Additional childhood history information: pt's maternal grandparents raised him; "My mom lived 2 houses down." his mother waws 43yo when she gave birth to pt and did not actively care for him until he was older Description of patient's relationship with caregiver when they were a child: close to grandparents and mother; did not know biological father as a child Patient's description of current relationship with people who raised him/her: close to mother; grandparents deceased; "I met my dad once. We didn't get along very well." How were you disciplined when you got in trouble as a child/adolescent?: n/a  Does patient have siblings?: Yes Number of Siblings: 5 Description of patient's current relationship with siblings: five sisters; one older and 4 younger. "We are all pretty close still."  Did patient suffer any verbal/emotional/physical/sexual abuse as a child?: No Did patient suffer from severe childhood neglect?: No Has patient ever been sexually abused/assaulted/raped as an adolescent or adult?: No Was the patient ever a victim of a crime or a disaster?: No Witnessed domestic violence?: No Has patient been effected by domestic violence as an adult?: No  Education:  Highest grade of school patient has completed: 9th grade. pt got in fights and had behavioral issues so he dropped out in 9th grade  Currently a student?: No Learning disability?: Yes What learning problems does patient have?: undiagnosed but pt reports significant learning difficulties in addition to behavioral problems.   Employment/Work Situation:   Employment situation: On disability Why is patient on disability: mental illness How long has patient been on disability: few years  Patient's job  has been impacted by current  illness: No What is the longest time patient has a held a job?: n/a  Where was the patient employed at that time?: n/a  Has patient ever been in the [employer]?: No Has patient ever served in combat?: No Did You Receive Any Psychiatric Treatment/Services While in Passenger transport manager?: No Are There Guns or Other Weapons in [place]?: No Are These Weapons Safely Secured?:  (n/a)  Financial Resources:   Financial resources: [employer], [employer], Food stamps Does patient have a Programmer, applications or guardian?: No  Alcohol/Substance Abuse:   What has been your use of drugs/alcohol within the last 12 months?: pt reports that he relapsed on alcohol last Friday after 2 years of sobriety and was pos for cocaine--"I don't remember using cocaine." Pt reports ongoing issues with alcohol abuse for several years.  If attempted suicide, did drugs/alcohol play a role in this?: Yes (pt was intoxicated when he overdosed on several medications. "I have no memory of this. I didn't think there was anything wrong." ) Alcohol/Substance Abuse Treatment Hx: Past Tx, Outpatient If yes, describe treatment: hx at [facility] in [place] and [facility] in [place] [facility] for outpatient services.  Has alcohol/substance abuse ever caused legal problems?: No  Social Support System:   Heritage manager System: Poor Describe Community Support System: few supportive friends in community; oldest daughter and his mother are biggest family supports Type of faith/religion: christian How does patient's faith help to cope with current illness?: prayer   Leisure/Recreation:   Leisure and Hobbies: spending time with my grandkids  Strengths/Needs:   What things does the patient do well?: motivated to stay sober and get invega shot on May 24th  In what areas does patient struggle / problems for patient: impulsivity and insight are lacking   Discharge Plan:   Does patient have access to transportation?: Yes (bus) Will  patient be returning to same living situation after discharge?: No Plan for living situation after discharge: pt plans to move to [facility], [place] to be with his daughter. "She will either pick me up or get me a greyhound ticket I guess."  Currently receiving community mental health services: Yes (From [facility]) [facility]) If no, would patient like referral for services when discharged?: Yes (What county?) ([place] in [place], [place]) Does patient have financial barriers related to discharge medications?: No [facility] )  Summary/Recommendations:   Summary and Recommendations (to be completed by the evaluator): Patient is 41 year old male living in [place], [place]. He presents to the hospital after an attempted overdose and relapse on alcohol/cocaine. Patient reports that he relapsed last Friday after 2 years of sobriety. Patient also goes to [facility] for medication management including invega shot every three months with his next shot due 06/27/16. Patient plans to move to [facility], [place] with his oldest daughter at discharge. He is on disability, divorced with 6 children. Recommendations for patient include: crisis stabilization, therapeutic milieu, encourage group attendance and participation, medication management for detox/mood stabilization, and development of comprehensive mental wellness/sobriety plan. Pt has a primary diagnosis of MDD, recurrent, severe.   Kimber Relic Smart LCSW 06/14/2016  10:29 AM

## 2016-06-14 NOTE — Tx Team (Signed)
Interdisciplinary Treatment and Diagnostic Plan Update  06/14/2016 Time of Session: 0930 Leonard Guerrero MRN: [id number]  Principal Diagnosis: MDD  Secondary Diagnoses: Active Problems:   Major depressive disorder, recurrent episode (HCC)   Current Medications:  Current Facility-Administered Medications  Medication Dose Route Frequency Provider Last Rate Last Dose  . acetaminophen (TYLENOL) tablet 650 mg  650 mg Oral Q6H PRN Nwoko, Agnes I, NP      . alum & mag hydroxide-simeth (MAALOX/MYLANTA) 200-200-20 MG/5ML suspension 30 mL  30 mL Oral Q4H PRN Nwoko, Agnes I, NP      . hydrOXYzine (ATARAX/VISTARIL) tablet 25 mg  25 mg Oral Q6H PRN Armandina Stammer I, NP   25 mg at 06/13/16 2126  . magnesium hydroxide (MILK OF MAGNESIA) suspension 30 mL  30 mL Oral Daily PRN Nwoko, Agnes I, NP      . nicotine polacrilex (NICORETTE) gum 2 mg  2 mg Oral PRN Armandina Stammer I, NP      . traZODone (DESYREL) tablet 50 mg  50 mg Oral QHS PRN Armandina Stammer I, NP   50 mg at 06/13/16 2126   PTA Medications: Prescriptions Prior to Admission  Medication Sig Dispense Refill Last Dose  . amoxicillin-clavulanate (AUGMENTIN) 875-125 MG tablet Take 1 tablet by mouth every 12 (twelve) hours. 10 tablet 0   . folic acid (FOLVITE) 1 MG tablet Take 1 tablet (1 mg total) by mouth daily. 30 tablet 1   . nicotine (NICODERM CQ - DOSED IN MG/24 HOURS) 14 mg/24hr patch Place 1 patch (14 mg total) onto the skin daily. 28 patch 0   . Paliperidone Palmitate (INVEGA TRINZA) 546 MG/1.75ML SUSP Inject 1.75 mLs into the muscle every 3 (three) months.   Feb 2018 at Unknown  . thiamine 100 MG tablet Take 1 tablet (100 mg total) by mouth daily. 30 tablet 1     Patient Stressors: Educational concerns Financial difficulties Occupational concerns Substance abuse  Patient Strengths: Average or above average intelligence Capable of independent living Communication skills Physical Health Supportive family/friends  Treatment Modalities:  Medication Management, Group therapy, Case management,  1 to 1 session with clinician, Psychoeducation, Recreational therapy.   Physician Treatment Plan for Primary Diagnosis: MDD  Medication Management: Evaluate patient's response, side effects, and tolerance of medication regimen.  Therapeutic Interventions: 1 to 1 sessions, Unit Group sessions and Medication administration.  Evaluation of Outcomes: Progressing  Physician Treatment Plan for Secondary Diagnosis: Active Problems:   Major depressive disorder, recurrent episode (HCC)  Long Term Goal(s):     Short Term Goals:       Medication Management: Evaluate patient's response, side effects, and tolerance of medication regimen.  Therapeutic Interventions: 1 to 1 sessions, Unit Group sessions and Medication administration.  Evaluation of Outcomes: Progressing   RN Treatment Plan for Primary Diagnosis :MDD Long Term Goal(s): Knowledge of disease and therapeutic regimen to maintain health will improve  Short Term Goals: Ability to remain free from injury will improve, Ability to verbalize feelings will improve and Ability to disclose and discuss suicidal ideas  Medication Management: RN will administer medications as ordered by provider, will assess and evaluate patient's response and provide education to patient for prescribed medication. RN will report any adverse and/or side effects to prescribing provider.  Therapeutic Interventions: 1 on 1 counseling sessions, Psychoeducation, Medication administration, Evaluate responses to treatment, Monitor vital signs and CBGs as ordered, Perform/monitor CIWA, COWS, AIMS and Fall Risk screenings as ordered, Perform wound care treatments as ordered.  Evaluation of Outcomes:  Progressing   LCSW Treatment Plan for Primary Diagnosis:MDD  Long Term Goal(s): Safe transition to appropriate next level of care at discharge, Engage patient in therapeutic group addressing interpersonal  concerns.  Short Term Goals: Engage patient in aftercare planning with referrals and resources, Facilitate patient progression through stages of change regarding substance use diagnoses and concerns and Identify triggers associated with mental health/substance abuse issues  Therapeutic Interventions: Assess for all discharge needs, 1 to 1 time with Social worker, Explore available resources and support systems, Assess for adequacy in community support network, Educate family and significant other(s) on suicide prevention, Complete Psychosocial Assessment, Interpersonal group therapy.  Evaluation of Outcomes: Progressing   Progress in Treatment: Attending groups: No. Participating in groups: No. New to unit. Continuing to assess.  Taking medication as prescribed: Yes. Toleration medication: Yes. Family/Significant other contact made: No, will contact:  family member if patient consents Patient understands diagnosis: Yes. Discussing patient identified problems/goals with staff: Yes. Medical problems stabilized or resolved: Yes. Denies suicidal/homicidal ideation: Yes. Issues/concerns per patient self-inventory: No. Other: n/a   New problem(s) identified: No, Describe:  n/a  New Short Term/Long Term Goal(s): detox; medication stabilization, development of comprehensive mental wellness/sobriety plan.   Discharge Plan or Barriers: CSW assessing for appropriate referrals. Pt lives with roommates in [place] and goes to [facility] for medication management. He gets invega shot every 3 months and next shot is due Jun 27, 2016.  Reason for Continuation of Hospitalization: Anxiety Depression Medication stabilization Suicidal ideation Withdrawal symptoms  Estimated Length of Stay: 3-5 days   Attendees: Patient: 06/14/2016 8:41 AM  Physician: Dr Jackquline BerlinIzediuno MD 06/14/2016 8:41 AM  Nursing: Carlus PavlovBobbie, Dan, Penny, Patty RN 06/14/2016 8:41 AM  RN Care Manager: Onnie BoerJennifer Clark CM 06/14/2016 8:41 AM   Social Worker: Trula SladeHeather Smart, LCSW; Daryel GeraldRodney North LCSW 06/14/2016 8:41 AM  Recreational Therapist: x 06/14/2016 8:41 AM  Other: Armandina StammerAgnes Nwoko NP 06/14/2016 8:41 AM  Other:  06/14/2016 8:41 AM  Other: 06/14/2016 8:41 AM    Scribe for Treatment Team: Ledell PeoplesHeather N Smart, LCSW 06/14/2016 8:41 AM

## 2016-06-14 NOTE — Progress Notes (Signed)
D.  Pt pleasant on approach, denies complaints at this time.  Pt has remained in room much of this shift.  Pt denies SI/HI/hallucinations at this time.  A.  Support and encouragement offered, medication given as ordered  R. Pt remain safe on the unit, will continue to monitor.

## 2016-06-14 NOTE — [facility] Suicide Risk Assessment (Signed)
[facility] INPATIENT:  Family/Significant Other Suicide Prevention Education  Suicide Prevention Education:  Patient Refusal for Family/Significant Other Suicide Prevention Education: The patient Leonard Guerrero has refused to provide written consent for family/significant other to be provided Family/Significant Other Suicide Prevention Education during admission and/or prior to discharge.  Physician notified.  SPE completed with pt, as pt refused to consent to family contact. SPI pamphlet provided to pt and pt was encouraged to share information with support network, ask questions, and talk about any concerns relating to SPE. Pt denies access to guns/firearms and verbalized understanding of information provided. Mobile Crisis information also provided to pt.   Anshika Pethtel N Smart LCSW 06/14/2016, 10:33 AM

## 2016-06-14 NOTE — H&P (Signed)
Psychiatric Admission Assessment Adult  Patient Identification: Esley Brooking  MRN:  [id number]  Date of Evaluation:  06/14/2016  Chief Complaint:  SCHIZOPHRENIA  Principal Diagnosis: Major depressive disorder, recurrent episodes., Alcohol use disorder, Paranoid Schizophrenia.  Diagnosis:   Patient Active Problem List   Diagnosis Date Noted  . Major depressive disorder, recurrent episode (HCC) [F33.9] 06/13/2016  . Alcoholic intoxication without complication (HCC) [F10.920]   . Acute encephalopathy [G93.40] 06/08/2016  . Acute respiratory failure (HCC) [J96.00]   . Altered mental status [R41.82]   . Encephalopathy acute [G93.40] 06/07/2016   History of Present Illness: This is an admission assessment for this 41 year old Caucasian male with hx of Paranoid Schizophrenia. Tyran is being admitted to the [facility] adult unit with complaints of intentional suicide attempt on a mixture of drugs/alcohol overdose. He was taken to the [facility] last Friday where he was in the ICU for medical stabilization. Hx. Prison term 9 years ago.  During this assessment, Arav reports, "I was taken to the [facility] last Friday. I don't know how I got there. I had overdosed on a mixture of pills. All I remember is, I was drinking, but, I don't remember getting drunk & what happened afterwards. I was messing with my daughter who lives in [place] to allow me to come live with her. I did the intentional overdose to get her attention, but not to kill myself. I don't want to die. I take medication for Paranoid Schizophrenia. I was diagnosed over 9 years ago & I have been on medication since then. I was on Q monthly Invega shots, but, was changed to Q 3 months 6 months ago. My shot is due to be given on the 24th of this month at [facility]. I have not heard any voices or been suicidal in a long time. I have no history of attempts prior to this. I'm not depressed or anxious. I just want to be turned loose to go  home. The trick worked. My daughter will be coming to get me back to [place] to live her again in the few weeks".  Associated Signs/Symptoms:  Depression Symptoms:  Currently denies any symptoms of depression or anxiety.  (Hypo) Manic Symptoms:  Impulsivity, denies any mood swings or mood liability.  Anxiety Symptoms:  "I'm anxious to get out of this hospital.  Psychotic Symptoms:  "I have not heard any voices or seen things in a long time"  PTSD Symptoms: Denies any PTSD events or symptoms  Total Time spent with patient: 1 hour  Past Psychiatric History: Paranoid Schizophrenia  Is the patient at risk to self? No.  Has the patient been a risk to self in the past 6 months? Yes.    Has the patient been a risk to self within the distant past? No.  Is the patient a risk to others? No.  Has the patient been a risk to others in the past 6 months? No.  Has the patient been a risk to others within the distant past? No.   Prior Inpatient Therapy: Patient denies. Prior Outpatient Therapy: Yes, [facility]  Alcohol Screening: 1. How often do you have a drink containing alcohol?: 4 or more times a week 2. How many drinks containing alcohol do you have on a typical day when you are drinking?: 5 or 6 3. How often do you have six or more drinks on one occasion?: Never Preliminary Score: 2 4. How often during the last year have you found that  you were not able to stop drinking once you had started?: Daily or almost daily 5. How often during the last year have you failed to do what was normally expected from you becasue of drinking?: Daily or almost daily 6. How often during the last year have you needed a first drink in the morning to get yourself going after a heavy drinking session?: Daily or almost daily 7. How often during the last year have you had a feeling of guilt of remorse after drinking?: Daily or almost daily 8. How often during the last year have you been unable to remember what  happened the night before because you had been drinking?: Daily or almost daily 9. Have you or someone else been injured as a result of your drinking?: No 10. Has a relative or friend or a doctor or another health worker been concerned about your drinking or suggested you cut down?: Yes, during the last year Alcohol Use Disorder Identification Test Final Score (AUDIT): 30 Brief Intervention: Yes  Substance Abuse History in the last 12 months:  Yes.    Consequences of Substance Abuse: Medical Consequences:  Liver damage, Possible death by overdose Legal Consequences:  Arrests, jail time, Loss of driving privilege. Family Consequences:  Family discord, divorce and or separation.  Previous Psychotropic Medications: Yes   Psychological Evaluations: No   Past Medical History:  Past Medical History:  Diagnosis Date  . Bipolar 1 disorder (HCC)   . ETOH abuse    History reviewed. No pertinent surgical history.  Family History: History reviewed. No pertinent family history.  Family Psychiatric  History: Schizophrenia: father, sisters.  Tobacco Screening: Have you used any form of tobacco in the last 30 days? (Cigarettes, Smokeless Tobacco, Cigars, and/or Pipes): Yes Tobacco use, Select all that apply: 5 or more cigarettes per day Are you interested in Tobacco Cessation Medications?: No, patient refused Counseled patient on smoking cessation including recognizing danger situations, developing coping skills and basic information about quitting provided: Refused/Declined practical counseling  Social History:  History  Alcohol Use  . 1.8 - 2.4 oz/week  . 3 - 4 Cans of beer per week     History  Drug use: Unknown    Additional Social History: Marital status: Divorced Divorced, when?: 14 years ago, pt divorced his 2nd wife.  What types of issues is patient dealing with in the relationship?: alcoholism Additional relationship information: n/a  Are you sexually active?: Yes What is  your sexual orientation?: heterosexual Has your sexual activity been affected by drugs, alcohol, medication, or emotional stress?: n/a  Does patient have children?: Yes How many children?: 6 How is patient's relationship with their children?: 3 boys; 3 girls. "I don't have a relationship with any of them but my oldest daughter. "I'm moving in with her in [facility] when I leave the hospital."   Allergies:  No Known Allergies  Lab Results: No results found for this or any previous visit (from the past 48 hour(s)).  Blood Alcohol level:  Lab Results  Component Value Date   ETH 16 (H) 06/07/2016   Metabolic Disorder Labs:  No results found for: HGBA1C, MPG No results found for: PROLACTIN Lab Results  Component Value Date   TRIG 74 06/08/2016   Current Medications: Current Facility-Administered Medications  Medication Dose Route Frequency Provider Last Rate Last Dose  . acetaminophen (TYLENOL) tablet 650 mg  650 mg Oral Q6H PRN Armandina Stammer I, NP      . alum & mag hydroxide-simeth (MAALOX/MYLANTA)  200-200-20 MG/5ML suspension 30 mL  30 mL Oral Q4H PRN Nwoko, Agnes I, NP      . hydrOXYzine (ATARAX/VISTARIL) tablet 25 mg  25 mg Oral Q6H PRN Armandina Stammer I, NP   25 mg at 06/13/16 2126  . magnesium hydroxide (MILK OF MAGNESIA) suspension 30 mL  30 mL Oral Daily PRN Nwoko, Agnes I, NP      . nicotine polacrilex (NICORETTE) gum 2 mg  2 mg Oral PRN Armandina Stammer I, NP   2 mg at 06/14/16 0947  . traZODone (DESYREL) tablet 50 mg  50 mg Oral QHS PRN Armandina Stammer I, NP   50 mg at 06/13/16 2126   PTA Medications: Prescriptions Prior to Admission  Medication Sig Dispense Refill Last Dose  . amoxicillin-clavulanate (AUGMENTIN) 875-125 MG tablet Take 1 tablet by mouth every 12 (twelve) hours. 10 tablet 0   . folic acid (FOLVITE) 1 MG tablet Take 1 tablet (1 mg total) by mouth daily. 30 tablet 1   . nicotine (NICODERM CQ - DOSED IN MG/24 HOURS) 14 mg/24hr patch Place 1 patch (14 mg total) onto the skin  daily. 28 patch 0   . Paliperidone Palmitate (INVEGA TRINZA) 546 MG/1.75ML SUSP Inject 1.75 mLs into the muscle every 3 (three) months.   Feb 2018 at Unknown  . thiamine 100 MG tablet Take 1 tablet (100 mg total) by mouth daily. 30 tablet 1    Musculoskeletal: Strength & Muscle Tone: within normal limits Gait & Station: normal Patient leans: N/A  Psychiatric Specialty Exam: Physical Exam  Constitutional: He is oriented to person, place, and time. He appears well-developed.  HENT:  Head: Normocephalic.  Eyes: Pupils are equal, round, and reactive to light.  Cardiovascular: Normal rate.   Respiratory: Effort normal.  GI: Soft.  Genitourinary:  Genitourinary Comments: Deferred  Musculoskeletal: Normal range of motion.  Neurological: He is alert and oriented to person, place, and time.  Skin: Skin is warm and dry.    Review of Systems  Constitutional: Negative.   HENT: Negative.   Eyes: Negative.   Respiratory: Negative.   Cardiovascular: Negative.   Gastrointestinal: Negative.   Genitourinary: Negative.   Musculoskeletal: Negative.   Skin: Negative.   Neurological: Negative.   Endo/Heme/Allergies: Negative.   Psychiatric/Behavioral: Positive for depression, hallucinations and substance abuse (BAL 16, UDS positive Cocaine). Negative for memory loss. The patient is nervous/anxious and has insomnia.     Blood pressure 110/68, pulse 89, temperature 97.8 F (36.6 C), temperature source Oral, resp. rate 16, height 6' (1.829 m), weight 86.2 kg (190 lb), SpO2 97 %.Body mass index is 25.77 kg/m.  General Appearance: Casual and Fairly Groomed  Eye Contact:  Fair  Speech:  Clear and Coherent and Normal Rate  Volume:  Normal  Mood:  Denies any symptoms of depression  Affect:  Appropriate and Congruent  Thought Process:  Descriptions of Associations: Intact  Orientation:  Full (Time, Place, and Person)  Thought Content:  Currently denies any hallucinations, delusional thinking or  paranoia.  Suicidal Thoughts:  Denies any thoughts, plans or intent.  Homicidal Thoughts:  Denies any thoughts, plans or intent.  Memory:  Immediate;   Good Recent;   Good Remote;   Fair  Judgement:  Intact  Insight:  Present  Psychomotor Activity:  Normal  Concentration:  Concentration: Good and Attention Span: Good  Recall:  Fairly good  Fund of Knowledge:  Fair  Language:  Good  Akathisia:  Negative  Handed:  Right  AIMS (if  indicated):     Assets:  Communication Skills Desire for Improvement Social Support  ADL's:  Intact  Cognition:  WNL  Sleep:  Number of Hours: 6.5   Treatment Plan/Recommendations: 1. Admit for crisis management and stabilization, estimated length of stay 3-5 days.  2. Medication management to reduce current symptoms to base line and improve the patient's overall level of functioning: See MD's SRA & Treatment plan.  3. Treat health problems as indicated.  4. Develop treatment plan to decrease risk of relapse upon discharge and the need for readmission.  5. Psycho-social education regarding relapse prevention and self care.  6. Health care follow up as needed for medical problems.  7. Review, reconcile, and reinstate any pertinent home medications for other health issues where appropriate. 8. Call for consults with hospitalist for any additional specialty patient care services as needed.  Observation Level/Precautions:  15 minute checks  Laboratory:  Per, BAL 16, UDS positive for Cocaine  Psychotherapy: Group sessions   Medications: See MAR   Consultations: As needed   Discharge Concerns: Safety, mood stability   Estimated LOS: 3-5 days  Other: Admit to the [facility].    Physician Treatment Plan for Primary Diagnosis: Will initiate medication management for mood stability. Set up an outpatient psychiatric services for medication management. Will encourage medication adherence with psychiatric medications.  Long Term Goal(s): Improvement in symptoms  so as ready for discharge  Short Term Goals: Ability to identify changes in lifestyle to reduce recurrence of condition will improve, Ability to verbalize feelings will improve, Ability to disclose and discuss suicidal ideas and Ability to demonstrate self-control will improve  Physician Treatment Plan for Secondary Diagnosis: Active Problems:   Major depressive disorder, recurrent episode (HCC)  Long Term Goal(s): Improvement in symptoms so as ready for discharge  Short Term Goals: Ability to identify and develop effective coping behaviors will improve, Compliance with prescribed medications will improve and Ability to identify triggers associated with substance abuse/mental health issues will improve  I certify that inpatient services furnished can reasonably be expected to improve the patient's condition.    Sanjuana KavaNwoko, Agnes I, NP, PMHNP, FNP-BC 5/11/201811:04 AM

## 2016-06-14 NOTE — [facility] Group Notes (Signed)
[facility] LCSW Group Therapy  06/14/2016  1:05 PM  Type of Therapy:  Group therapy  Participation Level:  Active  Participation Quality:  Attentive  Affect:  Flat  Cognitive:  Oriented  Insight:  Limited  Engagement in Therapy:  Limited  Modes of Intervention:  Discussion, Socialization  Summary of Progress/Problems:  Chaplain was here to lead a group on themes of hope and courage. Invited.  In bed, awake, engageable.  "I can't come until I see the Dr.  Drucilla Schmidto you think he will see me soon?"  Ida Rogueorth, Marjean Imperato B 06/14/2016 10:42 AM

## 2016-06-14 NOTE — Progress Notes (Signed)
Recreation Therapy Notes  Date: 06/14/16 Time: 1000 Location: [facility] Hall Dayroom  Group Topic: Stress Management  Goal Area(s) Addresses:  Patient will verbalize importance of using healthy stress management.  Patient will identify positive emotions associated with healthy stress management.   Intervention: Stress Management  Activity :  Peaceful Waves, Music.  LRT introduced the stress management techniques of guided imagery and music.  Patients were to listen and follow along as LRT read script to participate in guided imagery.  Patients also listened to music, socialized and moved along with the music.  Education:  Stress Management, Discharge Planning.   Education Outcome: Acknowledges edcuation/In group clarification offered/Needs additional education  Clinical Observations/Feedback: Pt did not attend group.   Greene Diodato, LRT/CTRS         Keyah Blizard A 06/14/2016 11:35 AM 

## 2016-06-14 NOTE — Social Work (Signed)
Referred to [facility] Transitional Care Team, is [place] resident.  Yaeko Fazekas, LCSW Lead Clinical Social Worker Phone:  [phone]  

## 2016-06-14 NOTE — Plan of Care (Signed)
Problem: Safety: Goal: Periods of time without injury will increase Outcome: Progressing Periods of time without injury will increase AEB [id number] safety check, client is safe on the unit.

## 2016-06-14 NOTE — Progress Notes (Signed)
Adult Psychoeducational Group Note  Date:  06/14/2016 Time:  12:51 AM  Group Topic/Focus:  Wrap-Up Group:   The focus of this group is to help patients review their daily goal of treatment and discuss progress on daily workbooks.  Participation Level:  Did Not Attend   Additional Comments:  Pt did not attend group. Pt remained in bed, pt encouraged to attend.   Karleen HampshireFox, Aidyn Sportsman Brittini 06/14/2016, 12:51 AM

## 2016-06-14 NOTE — [facility] Suicide Risk Assessment (Signed)
[facility] Admission Suicide Risk Assessment   Nursing information obtained from:  Patient Demographic factors:  Male, Caucasian, Low socioeconomic status, Unemployed Current Mental Status:  NA Loss Factors:  Decrease in vocational status, Financial problems / change in socioeconomic status, Legal issues Historical Factors:  Impulsivity Risk Reduction Factors:  Sense of responsibility to family  Total Time spent with patient: 45 minutes Principal Problem: Schizophrenia Diagnosis:   Patient Active Problem List   Diagnosis Date Noted  . Major depressive disorder, recurrent episode (HCC) [F33.9] 06/13/2016  . Alcoholic intoxication without complication (HCC) [F10.920]   . Acute encephalopathy [G93.40] 06/08/2016  . Acute respiratory failure (HCC) [J96.00]   . Altered mental status [R41.82]   . Encephalopathy acute [G93.40] 06/07/2016   Subjective Data:  [name], single, lives alone. Background history of schizophrenia and SUD. Presented to the ER seven days ago via EMS. Had altered mental status at presentation. Overdosed on Trazodone and Cogentin. UDS was positive for cocaine. Had some alcohol on board. Patient was managed in ICU. He was transferred here post medical stabilization. Reports OD was intentional. Says his goal was to get his daughter's attention. Wants his daughter to take him into her home in [place]. Says he sent a Facebook message to his daughter after the OD. His daughter has agreed to take him into her house. Patient is glad his plans worked out. Understands his behavior was risky. Says he does not have any intent to die. Pleased he is alive. Says he has been adherent with his depot. Gets Invega every three months. Next dose is due on 06/27/16. No residual psychosis. Not depressed. Not manic. Not anxious. Has history of restless legs. No access to weapons. No past suicidal behavior.   Continued Clinical Symptoms:  Alcohol Use Disorder Identification Test Final Score  (AUDIT): 30 The "Alcohol Use Disorders Identification Test", Guidelines for Use in Primary Care, Second Edition.  World Science writer [facility]). Score between 0-7:  no or low risk or alcohol related problems. Score between 8-15:  moderate risk of alcohol related problems. Score between 16-19:  high risk of alcohol related problems. Score 20 or above:  warrants further diagnostic evaluation for alcohol dependence and treatment.   CLINICAL FACTORS:  Substance use Psychosis    Musculoskeletal: Strength & Muscle Tone: within normal limits Gait & Station: normal Patient leans: N/A  Psychiatric Specialty Exam: Physical Exam  Constitutional: He is oriented to person, place, and time. He appears well-developed and well-nourished.  HENT:  Head: Normocephalic and atraumatic.  Eyes: Pupils are equal, round, and reactive to light.  Neck: Normal range of motion.  Cardiovascular: Normal rate and regular rhythm.   Respiratory: Breath sounds normal.  GI: Soft. Bowel sounds are normal.  Musculoskeletal: Normal range of motion.  Neurological: He is alert and oriented to person, place, and time.  Skin: Skin is warm and dry.  Psychiatric:  As above    ROS  Blood pressure 110/68, pulse 89, temperature 97.8 F (36.6 C), temperature source Oral, resp. rate 16, height 6' (1.829 m), weight 86.2 kg (190 lb), SpO2 97 %.Body mass index is 25.77 kg/m.  General Appearance: Neatly dressed, calm and cooperative. Good relatedness. Appropriate behavior.   Eye Contact:  Good  Speech:  Clear and Coherent and Normal Rate  Volume:  Normal  Mood:  Euthymic  Affect:  Appropriate and Restricted  Thought Process:  Linear  Orientation:  Full (Time, Place, and Person)  Thought Content:  No delusional theme. No preoccupation with  violent thoughts. No negative ruminations. No obsession.  No hallucination in any modality.   Suicidal Thoughts:  No  Homicidal Thoughts:  No  Memory:  Immediate;   Fair Recent;    Good Remote;   Good  Judgement:  Fair  Insight:  Good  Psychomotor Activity:  Normal  Concentration:  Concentration: Good and Attention Span: Good  Recall:  Good  Fund of Knowledge:  Good  Language:  Good  Akathisia:  No  Handed:    AIMS (if indicated):     Assets:  Communication Skills Desire for Improvement Financial Resources/Insurance Physical Health Resilience Social Support  ADL's:  Intact  Cognition:  WNL  Sleep:  Number of Hours: 6.5      COGNITIVE FEATURES THAT CONTRIBUTE TO RISK:  None    SUICIDE RISK:   Moderate:  Frequent suicidal ideation with limited intensity, and duration, some specificity in terms of plans, no associated intent, good self-control, limited dysphoria/symptomatology, some risk factors present, and identifiable protective factors, including available and accessible social support.  PLAN OF CARE:  1. Suicide precautions 2. Recommence home medications 3. Would monitor mood, behavior and interaction with others   I certify that inpatient services furnished can reasonably be expected to improve the patient's condition.   Georgiann CockerVincent A Tabor Denham, MD 06/14/2016, 2:11 PM

## 2016-06-15 DIAGNOSIS — F17210 Nicotine dependence, cigarettes, uncomplicated: Secondary | ICD-10-CM

## 2016-06-15 MED ORDER — BENZTROPINE MESYLATE 2 MG PO TABS
2.0000 mg | ORAL_TABLET | Freq: Two times a day (BID) | ORAL | Status: DC
  Administered 2016-06-15 – 2016-06-19 (×9): 2 mg via ORAL
  Filled 2016-06-15 (×13): qty 1

## 2016-06-15 NOTE — [facility] Group Notes (Signed)
[facility] LCSW Group Therapy Note  06/15/2016  and  11:00 AM  Type of Therapy and Topic:  Group Therapy: Avoiding Self-Sabotaging and Enabling Behaviors  Participation Level:  Did Not Attend despite overhead announcement and direct invitation.   Summary of Progress/Problems:  The main focus of today's process group was for the patient to identify ways in which they have in the past sabotaged their own recovery. Motivational Interviewing was utilized to identify motivation they may have for wanting to change.   Carney Bernatherine C Harrill, LCSW

## 2016-06-15 NOTE — Progress Notes (Signed)
Staying to self in room and bed much of the day, but open to discussing symptoms with RN. Denies wanting to kill himself when he overdosed "I was just trying to get my daughter to come get me and live with her and my grandbabies. It worked. When I leave here she is going to come [place]. If I intended to be dead, I would be dead". Patient has marked mouth and finger movements. Minimal interaction with peers, does not attend groups.

## 2016-06-15 NOTE — Progress Notes (Signed)
Psychoeducational Group Note  Date:  06/15/2016 Time:  2030  Group Topic/Focus:  wrap up group  Participation Level: Did Not Attend  Participation Quality:  Not Applicable  Affect:  Not Applicable  Cognitive:  Not Applicable  Insight:  Not Applicable  Engagement in Group: Not Applicable  Additional Comments:  Pt was notified that group was beginning but remained in bed.   Marcille BuffyMcNeil, Zhuri Krass S 06/15/2016, 9:11 PM

## 2016-06-15 NOTE — Progress Notes (Signed)
Adult Psychoeducational Group Note  Date:  06/15/2016 Time:  3:46 AM  Group Topic/Focus:  Wrap-Up Group:   The focus of this group is to help patients review their daily goal of treatment and discuss progress on daily workbooks.  Participation Level:  Did Not Attend  Participation Quality:  Patient did not attend  Affect:  Patient did not attend  Cognitive:  Patient did not attend  Insight: None  Engagement in Group:  Patient did not attend  Modes of Intervention:  Patient did not attend  Additional Comments: Patient did not attend evening wrap up group because Patient went to his AA meeting and was appropriate.  Leonard FurnaceChristopher  [name] 06/15/2016, 3:46 AM

## 2016-06-15 NOTE — Progress Notes (Signed)
[facility] MD Progress Note  06/15/2016 2:04 PM Leonard Guerrero  MRN:  [id number]   Subjective:  Patient reports " I am okay, I just want to be left alone." Reports he is not interested in attending group sessions. Patient reports " I tried to kill my self but, I am fine now." Leonard Guerrero is awake, alert and oriented *3. Seen resting in bed.(cover pulled over his head.)   Denies suicidal or homicidal ideation.( with "yes and no") responses didn't elaborate.  Denies auditory or visual hallucination. Chart reviewed patient receives monthly (3 months) injection for mood stabilization. states that Reports good appetite and reports he just want to rest today.Support, encouragement and reassurance was provided.    Principal Problem: <principal problem not specified> Diagnosis:   Patient Active Problem List   Diagnosis Date Noted  . Major depressive disorder, recurrent episode (HCC) [F33.9] 06/13/2016  . Alcoholic intoxication without complication (HCC) [F10.920]   . Acute encephalopathy [G93.40] 06/08/2016  . Acute respiratory failure (HCC) [J96.00]   . Altered mental status [R41.82]   . Encephalopathy acute [G93.40] 06/07/2016   Total Time spent with patient: 30 minutes  Past Psychiatric History:   Past Medical History:  Past Medical History:  Diagnosis Date  . Bipolar 1 disorder (HCC)   . ETOH abuse    History reviewed. No pertinent surgical history. Family History: History reviewed. No pertinent family history. Family Psychiatric  History:  Social History:  History  Alcohol Use  . 1.8 - 2.4 oz/week  . 3 - 4 Cans of beer per week     History  Drug use: Unknown    Social History   Social History  . Marital status: Unknown    Spouse name: N/A  . Number of children: N/A  . Years of education: N/A   Social History Main Topics  . Smoking status: Current Every Day Smoker    Packs/day: 1.00    Years: 10.00    Types: Cigarettes  . Smokeless tobacco: Never Used  . Alcohol use 1.8 -  2.4 oz/week    3 - 4 Cans of beer per week  . Drug use: Unknown  . Sexual activity: Not Asked   Other Topics Concern  . None   Social History Narrative  . None   Additional Social History:                         Sleep: Fair  Appetite:  Fair  Current Medications: Current Facility-Administered Medications  Medication Dose Route Frequency Provider Last Rate Last Dose  . acetaminophen (TYLENOL) tablet 650 mg  650 mg Oral Q6H PRN Nwoko, Agnes I, NP      . alum & mag hydroxide-simeth (MAALOX/MYLANTA) 200-200-20 MG/5ML suspension 30 mL  30 mL Oral Q4H PRN Nwoko, Agnes I, NP      . benztropine (COGENTIN) tablet 2 mg  2 mg Oral BID Izediuno, Delight Ovens, MD   2 mg at 06/15/16 1217  . folic acid (FOLVITE) tablet 1 mg  1 mg Oral Daily Armandina Stammer I, NP   1 mg at 06/15/16 0903  . hydrOXYzine (ATARAX/VISTARIL) tablet 25 mg  25 mg Oral Q6H PRN Armandina Stammer I, NP   25 mg at 06/14/16 2056  . magnesium hydroxide (MILK OF MAGNESIA) suspension 30 mL  30 mL Oral Daily PRN Nwoko, Agnes I, NP      . nicotine polacrilex (NICORETTE) gum 2 mg  2 mg Oral PRN Sanjuana Kava, NP  2 mg at 06/15/16 0905  . [START ON 06/27/2016] Paliperidone Palmitate SUSP 546 mg  546 mg Intramuscular Q90 days Eappen, Levin BaconSaramma, MD      . thiamine tablet 100 mg  100 mg Oral Daily Armandina StammerNwoko, Agnes I, NP   100 mg at 06/15/16 0903  . traZODone (DESYREL) tablet 50 mg  50 mg Oral QHS PRN Armandina StammerNwoko, Agnes I, NP   50 mg at 06/14/16 2056    Lab Results: No results found for this or any previous visit (from the past 48 hour(s)).  Blood Alcohol level:  Lab Results  Component Value Date   ETH 16 (H) 06/07/2016    Metabolic Disorder Labs: No results found for: HGBA1C, MPG No results found for: PROLACTIN Lab Results  Component Value Date   TRIG 74 06/08/2016    Physical Findings: AIMS: Facial and Oral Movements Muscles of Facial Expression: None, normal Lips and Perioral Area: Moderate Jaw: Minimal Tongue:  Minimal,Extremity Movements Upper (arms, wrists, hands, fingers): None, normal Lower (legs, knees, ankles, toes): None, normal, Trunk Movements Neck, shoulders, hips: None, normal, Overall Severity Severity of abnormal movements (highest score from questions above): None, normal Incapacitation due to abnormal movements: None, normal Patient's awareness of abnormal movements (rate only patient's report): No Awareness, Dental Status Current problems with teeth and/or dentures?: Yes Does patient usually wear dentures?: No  CIWA:  CIWA-Ar Total: 1 COWS:     Musculoskeletal: Strength & Muscle Tone: within normal limits Gait & Station: resting in bed Patient leans: N/A  Psychiatric Specialty Exam: Physical Exam  Vitals reviewed. Constitutional: He is oriented to person, place, and time. He appears well-developed.  Cardiovascular: Normal rate.   Neurological: He is alert and oriented to person, place, and time.  Psychiatric: He has a normal mood and affect. His behavior is normal.    Review of Systems  Psychiatric/Behavioral: Positive for depression and suicidal ideas. The patient is nervous/anxious.     Blood pressure 124/68, pulse 78, temperature 98.1 F (36.7 C), temperature source Oral, resp. rate 16, height 6' (1.829 m), weight 86.2 kg (190 lb), SpO2 97 %.Body mass index is 25.77 kg/m.  General Appearance: Guarded  Eye Contact:  Fair  Speech:  Clear and Coherent  Volume:  Normal  Mood:  Anxious  Affect:  Congruent  Thought Process:  Coherent  Orientation:  Full (Time, Place, and Person)  Thought Content:  Hallucinations: None  Suicidal Thoughts:  No  Homicidal Thoughts:  No  Memory:  Immediate;   Fair Recent;   Fair Remote;   Fair  Judgement:  Fair  Insight:  Present  Psychomotor Activity:  Normal  Concentration:  Concentration: Fair  Recall:  FiservFair  Fund of Knowledge:  Fair  Language:  Fair  Akathisia:  No  Handed:  Right  AIMS (if indicated):     Assets:   Communication Skills Desire for Improvement Resilience Social Support  ADL's:  Intact  Cognition:  WNL  Sleep:  Number of Hours: 6.75    I agree with current treatment plan on 06/15/2016, Patient seen face-to-face for psychiatric evaluation follow-up, chart reviewed. Reviewed the information documented and agree with the treatment plan.   Treatment Plan Summary: Daily contact with patient to assess and evaluate symptoms and progress in treatment and Medication management   Continue with Palioperidon 546 Intermuscular x 3 moths and Cogentin 2mg  BID for mood stabilization. Continue with Trazodone 50 mg for insomnia Will continue to monitor vitals ,medication compliance and treatment side effects while patient is  here.  CSW will start working on disposition.  Patient to participate in therapeutic milieu   Oneta Rack, NP 06/15/2016, 2:04 PM

## 2016-06-16 NOTE — Progress Notes (Signed)
Psychoeducational Group Note  Date:  06/16/2016 Time:  2030  Group Topic/Focus:  wrap up group  Participation Level: Did Not Attend  Participation Quality:  Not Applicable  Affect:  Not Applicable  Cognitive:  Not Applicable  Insight:  Not Applicable  Engagement in Group: Not Applicable  Additional Comments:  Pt was notified that group was beginning but remained in bed. Pt requested sleep meds as soon as he could get them. Pt was also invited to AA meeting to encourage going to a group but pt remained in bed.   Marcille BuffyMcNeil, Cloey Sferrazza S 06/16/2016, 8:36 PM

## 2016-06-16 NOTE — Progress Notes (Signed)
D: Pt at the time of assessment was flat, isolative and withdrawn to room; stayed in be all evening. Pt at the time endorsed moderate anxiety and depression; states, "I guess I'm in the room alone because I'm a little depressed."  Pt denied SI, HI, pain or AVH. Pt remained calm and cooperative. A: Medications offered as prescribed. All patient's questions and concerns addressed. Support, encouragement, and safe environment provided. Will continue to monitor for any changes. 15-minute safety checks continue. R: Pt was med compliant. Pt did not attend wrap-up group. Safety checks continue.

## 2016-06-16 NOTE — [facility] Group Notes (Signed)
[facility] LCSW Group Therapy  06/16/2016  11 AM  Type of Therapy:  Group Therapy  Participation Level:  Did Not Attend; invited to participate yet did not despite overhead announcement and encouragement by staff   Summary of Progress/Problems: Topic for today was thoughts and feelings regarding discharge. We discussed fears of upcoming changes including judgements, expectations and stigma of mental health issues. We then discussed supports: what constitutes a supportive framework, identification of supports and what to do when others are not supportive. Patients processed their greatest challenges  Carney Bernatherine C Harrill, LCSW

## 2016-06-16 NOTE — Progress Notes (Signed)
[facility] MD Progress Note  06/16/2016 1:15 PM Leonard Guerrero  MRN:  [id number] Subjective:   41 yo Caucasian male, single, lives alone. Background history of schizophrenia and SUD. Presented to the ER seven days ago via EMS. Had altered mental status at presentation. Overdosed on Trazodone and Cogentin. UDS was positive for cocaine. Had some alcohol on board. Patient was managed in ICU. He was transferred here post medical stabilization.  Chart reviewed today. Patient discussed at team.  Staff reports that he isolates self. Minimal group attendance. Has not been observed to be internally preoccupied. Has not expressed any suicidal thoughts.   Seen today. Says he is feeling good. No suicidal thoughts. No perceptual abnormalities. No paranoia. Not endorsing any other form of delusion. He has not spoken to his daughter yet. Says she would be calling him later. Still optimistic that she would take him back to [place]. No violent thoughts. EPS is better since he restarted Cogentin.  Principal Problem: Schizophrenia Diagnosis:   Patient Active Problem List   Diagnosis Date Noted  . Major depressive disorder, recurrent episode (HCC) [F33.9] 06/13/2016  . Alcoholic intoxication without complication (HCC) [F10.920]   . Acute encephalopathy [G93.40] 06/08/2016  . Acute respiratory failure (HCC) [J96.00]   . Altered mental status [R41.82]   . Encephalopathy acute [G93.40] 06/07/2016   Total Time spent with patient: 20 minutes  Past Psychiatric History: As in H&P  Past Medical History:  Past Medical History:  Diagnosis Date  . Bipolar 1 disorder (HCC)   . ETOH abuse    History reviewed. No pertinent surgical history. Family History: History reviewed. No pertinent family history. Family Psychiatric  History: As in H&P  Social History:  History  Alcohol Use  . 1.8 - 2.4 oz/week  . 3 - 4 Cans of beer per week     History  Drug use: Unknown    Social History   Social History  . Marital  status: Unknown    Spouse name: N/A  . Number of children: N/A  . Years of education: N/A   Social History Main Topics  . Smoking status: Current Every Day Smoker    Packs/day: 1.00    Years: 10.00    Types: Cigarettes  . Smokeless tobacco: Never Used  . Alcohol use 1.8 - 2.4 oz/week    3 - 4 Cans of beer per week  . Drug use: Unknown  . Sexual activity: Not Asked   Other Topics Concern  . None   Social History Narrative  . None   Additional Social History:        Sleep: Good  Appetite:  Good  Current Medications: Current Facility-Administered Medications  Medication Dose Route Frequency Provider Last Rate Last Dose  . acetaminophen (TYLENOL) tablet 650 mg  650 mg Oral Q6H PRN Nwoko, Agnes I, NP      . alum & mag hydroxide-simeth (MAALOX/MYLANTA) 200-200-20 MG/5ML suspension 30 mL  30 mL Oral Q4H PRN Nwoko, Agnes I, NP      . benztropine (COGENTIN) tablet 2 mg  2 mg Oral BID Izediuno, Delight Ovens, MD   2 mg at 06/16/16 0801  . folic acid (FOLVITE) tablet 1 mg  1 mg Oral Daily Nwoko, Agnes I, NP   1 mg at 06/16/16 0801  . hydrOXYzine (ATARAX/VISTARIL) tablet 25 mg  25 mg Oral Q6H PRN Armandina Stammer I, NP   25 mg at 06/14/16 2056  . magnesium hydroxide (MILK OF MAGNESIA) suspension 30 mL  30 mL Oral  Daily PRN Armandina StammerNwoko, Agnes I, NP      . nicotine polacrilex (NICORETTE) gum 2 mg  2 mg Oral PRN Armandina StammerNwoko, Agnes I, NP   2 mg at 06/16/16 0803  . [START ON 06/27/2016] Paliperidone Palmitate SUSP 546 mg  546 mg Intramuscular Q90 days Eappen, Levin BaconSaramma, MD      . thiamine tablet 100 mg  100 mg Oral Daily Armandina StammerNwoko, Agnes I, NP   100 mg at 06/16/16 0802  . traZODone (DESYREL) tablet 50 mg  50 mg Oral QHS PRN Armandina StammerNwoko, Agnes I, NP   50 mg at 06/14/16 2056    Lab Results: No results found for this or any previous visit (from the past 48 hour(s)).  Blood Alcohol level:  Lab Results  Component Value Date   ETH 16 (H) 06/07/2016    Metabolic Disorder Labs: No results found for: HGBA1C, MPG No  results found for: PROLACTIN Lab Results  Component Value Date   TRIG 74 06/08/2016    Physical Findings: AIMS: Facial and Oral Movements Muscles of Facial Expression: None, normal Lips and Perioral Area: Moderate Jaw: Mild Tongue: Moderate,Extremity Movements Upper (arms, wrists, hands, fingers): None, normal Lower (legs, knees, ankles, toes): None, normal, Trunk Movements Neck, shoulders, hips: None, normal, Overall Severity Severity of abnormal movements (highest score from questions above): Mild Incapacitation due to abnormal movements: Minimal Patient's awareness of abnormal movements (rate only patient's report): Aware, mild distress, Dental Status Current problems with teeth and/or dentures?: Yes Does patient usually wear dentures?: No  CIWA:  CIWA-Ar Total: 1 COWS:     Musculoskeletal: Strength & Muscle Tone: within normal limits Gait & Station: normal Patient leans: N/A  Psychiatric Specialty Exam: Physical Exam  Constitutional: He is oriented to person, place, and time. He appears well-developed and well-nourished.  HENT:  Head: Normocephalic and atraumatic.  Eyes: Conjunctivae are normal. Pupils are equal, round, and reactive to light.  Neck: Normal range of motion. Neck supple.  Cardiovascular: Normal rate and regular rhythm.   Respiratory: Effort normal and breath sounds normal.  GI: Soft. Bowel sounds are normal.  Musculoskeletal: Normal range of motion.  Neurological: He is alert and oriented to person, place, and time.  Skin: Skin is warm and dry.  Psychiatric:  As above.     ROS  Blood pressure 94/67, pulse 83, temperature 98.2 F (36.8 C), resp. rate 16, height 6' (1.829 m), weight 86.2 kg (190 lb), SpO2 97 %.Body mass index is 25.77 kg/m.  General Appearance: In bed, calm and cooperative. Pleasant. Appropriate.   Eye Contact:  Good  Speech:  Clear and Coherent  Volume:  Normal  Mood:  Euthymic  Affect:  Appropriate and Restricted  Thought  Process:  Goal Directed  Orientation:  Full (Time, Place, and Person)  Thought Content:  No delusional theme. No preoccupation with violent thoughts. No negative ruminations. No obsession.  No hallucination in any modality.   Suicidal Thoughts:  No  Homicidal Thoughts:  No  Memory:  Immediate;   Good Recent;   Good Remote;   Good  Judgement:  Good  Insight:  Good  Psychomotor Activity:  Normal  Concentration:  Concentration: Good and Attention Span: Good  Recall:  Good  Fund of Knowledge:  Good  Language:  Good  Akathisia:  No  Handed:    AIMS (if indicated):     Assets:  Communication Skills Desire for Improvement Physical Health Resilience  ADL's:  Intact  Cognition:  WNL  Sleep:  Number of Hours:  5.25     Treatment Plan Summary:  Patient is not psychotic. He is not pervasively depressed. He is not expressing any suicidal thoughts lately. Would finalize aftercare with his family  Psychiatric: Schizophrenia paranoid type SUD Medical:  Psychosocial:  Limited social support.  PLAN: 1. Continue current regimen 2. Continue to monitor mood, behavior and interaction with peers 3. Collateral from his family 4. Hopeful discharge early this week.    Georgiann Cocker, MD 06/16/2016, 1:15 PM

## 2016-06-17 DIAGNOSIS — F203 Undifferentiated schizophrenia: Secondary | ICD-10-CM

## 2016-06-17 DIAGNOSIS — F1420 Cocaine dependence, uncomplicated: Secondary | ICD-10-CM

## 2016-06-17 DIAGNOSIS — F200 Paranoid schizophrenia: Secondary | ICD-10-CM | POA: Diagnosis present

## 2016-06-17 DIAGNOSIS — F329 Major depressive disorder, single episode, unspecified: Secondary | ICD-10-CM | POA: Diagnosis present

## 2016-06-17 DIAGNOSIS — F10230 Alcohol dependence with withdrawal, uncomplicated: Secondary | ICD-10-CM | POA: Diagnosis present

## 2016-06-17 MED ORDER — ACAMPROSATE CALCIUM 333 MG PO TBEC
666.0000 mg | DELAYED_RELEASE_TABLET | Freq: Three times a day (TID) | ORAL | Status: DC
  Administered 2016-06-17 – 2016-06-19 (×7): 666 mg via ORAL
  Filled 2016-06-17 (×12): qty 2

## 2016-06-17 MED ORDER — ESCITALOPRAM OXALATE 5 MG PO TABS
5.0000 mg | ORAL_TABLET | Freq: Every day | ORAL | Status: DC
  Administered 2016-06-17 – 2016-06-19 (×3): 5 mg via ORAL
  Filled 2016-06-17 (×6): qty 1

## 2016-06-17 NOTE — Progress Notes (Signed)
Adult Psychoeducational Group Note  Date:  06/17/2016 Time:  8:47 PM  Group Topic/Focus:  Wrap-Up Group:   The focus of this group is to help patients review their daily goal of treatment and discuss progress on daily workbooks.  Participation Level:  Did Not Attend  Additional Comments:  Pt was invited to group, however pt remained in his room in bed  Caswell CorwinOwen, Cassidey Barrales C 06/17/2016, 8:47 PM

## 2016-06-17 NOTE — Progress Notes (Signed)
D: Pt at the time of assessment was flat, isolative and withdrawn to room; stayed in be all evening. Pt at the time denies SI, HI, pain, anxiety, depression or AVH; states, " think I am ready to go home." Pt remained calm and cooperative. A: Medications offered as prescribed. All patient's questions and concerns addressed. Support, encouragement, and safe environment provided. Will continue to monitor for any changes. 15-minute safety checks continue. R: Pt was med compliant. Pt did not attend wrap-up group. Safety checks continue.

## 2016-06-17 NOTE — Progress Notes (Signed)
Recreation Therapy Notes  Date: 06/17/16 Time: 1000 Location: [address] Dayroom  Group Topic: Coping Skills  Goal Area(s) Addresses:  Patient will be able to identify positive coping skills. Patient will be able to identify benefits of coping skills. Patient will be able to identify benefits of using coping skills post d/c.  Intervention: Coping skills worksheet, magazines, scissors, glue sticks, construction paper  Activity: Coping skills collage.  Patients were to identify coping skills that can be used for diversions, cognitive, social, tension releasers and for physical.   Education: Coping Skills, Discharge Planning.   Education Outcome: Acknowledges understanding/In group clarification offered/Needs additional education.   Clinical Observations/Feedback: Pt did not attend group.   Caroll RancherMarjette Rexton Greulich, LRT/CTRS         Caroll RancherLindsay, Suzann Lazaro A 06/17/2016 12:04 PM

## 2016-06-17 NOTE — Progress Notes (Signed)
[facility]  06/17/2016 1:55 PM Leonard Guerrero  MRN:  [id number] Subjective:  Patient states " I am worried about if my daughter is OK with [place] going to [place]. I still feel kind of anxious all day.'  Objective:Patient seen and chart reviewed.Discussed patient with treatment team.  Pt presented with alcohol, cocaine abuse as well as S/P OD on medications in a suicide attempt. Pt reports he attempted suicide since he wanted to be with his daughter who lives in [place] and does not want to be here.  Contacted daughter -Leonard Guerrero with pt present- discussed concerns , disposition plan and [facility] [place] [place]. Daughter reports she wants to follow whatever decision her mother and grand mother takes and reported that she is OK with him returning there as long as he stays away from alcohol or other substances , since he has gotten in to trouble previously due to the same.   Per RN , pt continues to need support, appears anxious on and off. Compliant on medications , denies any ADRs to medications.    Principal Problem: Schizophrenia Diagnosis:   Patient Active Problem List   Diagnosis Date Noted  . Schizophrenia (HCC) [F20.9] 06/17/2016  . Major depressive disorder with single episode [F32.9] 06/17/2016  . Alcohol use disorder, severe, dependence (HCC) [F10.20] 06/17/2016  . Cocaine use disorder, moderate, dependence (HCC) [F14.20] 06/17/2016  . Alcoholic intoxication without complication (HCC) [F10.920]   . Acute encephalopathy [G93.40] 06/08/2016  . Acute respiratory failure (HCC) [J96.00]   . Altered mental status [R41.82]   . Encephalopathy acute [G93.40] 06/07/2016   Total Time spent with patient: 25 minutes  Past Psychiatric History: As in H&P  Past Medical History:  Past Medical History:  Diagnosis Date  . Bipolar 1 disorder (HCC)   . ETOH abuse    History reviewed. No pertinent surgical history. Family History: History reviewed. No pertinent  family history. Family Psychiatric  History: As in H&P  Social History:  History  Alcohol Use  . 1.8 - 2.4 oz/week  . 3 - 4 Cans of beer per week     History  Drug use: Unknown    Social History   Social History  . Marital status: Unknown    Spouse name: N/A  . Number of children: N/A  . Years of education: N/A   Social History Main Topics  . Smoking status: Current Every Day Smoker    Packs/day: 1.00    Years: 10.00    Types: Cigarettes  . Smokeless tobacco: Never Used  . Alcohol use 1.8 - 2.4 oz/week    3 - 4 Cans of beer per week  . Drug use: Unknown  . Sexual activity: Not Asked   Other Topics Concern  . None   Social History Narrative  . None   Additional Social History:        Sleep: Fair  Appetite:  Fair  Current Medications: Current Facility-Administered Medications  Medication Dose Route Frequency Provider Last Rate Last Dose  . acamprosate (CAMPRAL) tablet 666 mg  666 mg Oral TID WC Marvie Calender, MD   666 mg at 06/17/16 1256  . acetaminophen (TYLENOL) tablet 650 mg  650 mg Oral Q6H PRN Nwoko, Agnes I, NP      . alum & mag hydroxide-simeth (MAALOX/MYLANTA) 200-200-20 MG/5ML suspension 30 mL  30 mL Oral Q4H PRN Nwoko, Agnes I, NP      . benztropine (COGENTIN) tablet 2 mg  2 mg  Oral BID Georgiann Cocker, MD   2 mg at 06/17/16 9562  . escitalopram (LEXAPRO) tablet 5 mg  5 mg Oral Daily Myrl Bynum, MD   5 mg at 06/17/16 1256  . folic acid (FOLVITE) tablet 1 mg  1 mg Oral Daily Nwoko, Agnes I, NP   1 mg at 06/17/16 1308  . hydrOXYzine (ATARAX/VISTARIL) tablet 25 mg  25 mg Oral Q6H PRN Armandina Stammer I, NP   25 mg at 06/16/16 2107  . magnesium hydroxide (MILK OF MAGNESIA) suspension 30 mL  30 mL Oral Daily PRN Armandina Stammer I, NP   30 mL at 06/16/16 1705  . nicotine polacrilex (NICORETTE) gum 2 mg  2 mg Oral PRN Armandina Stammer I, NP   2 mg at 06/17/16 1117  . [START ON 06/27/2016] Paliperidone Palmitate SUSP 546 mg  546 mg Intramuscular Q90 days  Leonard Guerrero, Leonard Bacon, MD      . thiamine tablet 100 mg  100 mg Oral Daily Nwoko, Agnes I, NP   100 mg at 06/17/16 1116  . traZODone (DESYREL) tablet 50 mg  50 mg Oral QHS PRN Armandina Stammer I, NP   50 mg at 06/16/16 2107    Lab Results: No results found for this or any previous visit (from the past 48 hour(s)).  Blood Alcohol level:  Lab Results  Component Value Date   ETH 16 (H) 06/07/2016    Metabolic Disorder Labs: No results found for: HGBA1C, MPG No results found for: PROLACTIN Lab Results  Component Value Date   TRIG 74 06/08/2016    Physical Findings: AIMS: Facial and Oral Movements Muscles of Facial Expression: None, normal Lips and Perioral Area: Mild Jaw: None, normal Tongue: Mild,Extremity Movements Upper (arms, wrists, hands, fingers): Minimal Lower (legs, knees, ankles, toes): None, normal, Trunk Movements Neck, shoulders, hips: None, normal, Overall Severity Severity of abnormal movements (highest score from questions above): Minimal Incapacitation due to abnormal movements: None, normal Patient's awareness of abnormal movements (rate only patient's report): Aware, no distress, Dental Status Current problems with teeth and/or dentures?: Yes Does patient usually wear dentures?: No  CIWA:  CIWA-Ar Total: 1 COWS:     Musculoskeletal: Strength & Muscle Tone: within normal limits Gait & Station: normal Patient leans: N/A  Psychiatric Specialty Exam: Physical Exam  Nursing note and vitals reviewed.   Review of Systems  Psychiatric/Behavioral: Positive for depression and substance abuse. The patient is nervous/anxious.   All other systems reviewed and are negative.   Blood pressure 130/63, pulse 70, temperature 97.8 F (36.6 C), temperature source Oral, resp. rate 16, height 6' (1.829 m), weight 86.2 kg (190 lb), SpO2 97 %.Body mass index is 25.77 kg/m.  General Appearance: calm, cooperative  Eye Contact:  Fair  Speech:  Normal Rate  Volume:  Normal  Mood:   Anxious and Dysphoric  Affect:  Appropriate  Thought Process:  Goal Directed and Descriptions of Associations: Intact  Orientation:  Full (Time, Place, and Person)  Thought Content:  Rumination , improving  Suicidal Thoughts:  No  Homicidal Thoughts:  No  Memory:  Immediate;   Fair Recent;   Fair Remote;   Fair  Judgement:  Fair  Insight:  Fair  Psychomotor Activity:  Tremor  Concentration:  Concentration: Fair and Attention Span: Fair  Recall:  [employer] of Knowledge:  Fair  Language:  Fair  Akathisia:  No  Handed:    AIMS (if indicated):     Assets:  Communication Skills Desire for Improvement  Physical Health Resilience  ADL's:  Intact  Cognition:  WNL  Sleep:  Number of Hours: 5.5   Schizophrenia (HCC) improving  Will continue today 06/17/16  plan as below except where it is noted.    Treatment Plan Summary:Patient presented S/P suicide attempt requiring ICU admission, medically cleared and was transferred to [facility] for further management . Pt today continues to report anxiety , restlessness as well as ruminates about his relational stressors. Pt is motivated to get substance abuse treatment and per discussion with his daughter who is in [place], pt may return there on discharge.  Plan:  Will continue Paliperidone Palmitate 546 mg - q3 months , next dose 06/27/16. Will start Lexapro 5 mg po daily for affective sx. Will continue Cogentin 2 mg po bid for EPS. Will add Campral 666 mg po tid for alcohol abuse/craving. Will continue Trazodone 50 mg po qhs prn for sleep issues. Will order tsh, lipid panel, hba1c, pl. CSW will continue to work on disposition.    Gracia Saggese, MD 06/17/2016, 1:55 PM

## 2016-06-17 NOTE — Social Work (Signed)
Patient declined Transitional Care Team services, stating he will be relocating out of state at discharge.  Santa GeneraAnne Cunningham, LCSW Lead Clinical Social Worker Phone:  [phone]

## 2016-06-17 NOTE — [facility] Group Notes (Signed)
[facility] LCSW Group Therapy  06/17/2016 1:15 pm  Type of Therapy: Process Group Therapy  Participation Level:  Active  Participation Quality:  Appropriate  Affect:  Flat  Cognitive:  Oriented  Insight:  Improving  Engagement in Group:  Limited  Engagement in Therapy:  Limited  Modes of Intervention:  Activity, Clarification, Education, Problem-solving and Support  Summary of Progress/Problems: Today's group addressed the issue of overcoming obstacles.  Patients were asked to identify their biggest obstacle post d/c that stands in the way of their on-going success, and then problem solve as to how to manage this. Invited. Chose to not attend.  Ida Rogueorth, Andee Chivers B 06/17/2016   3:48 PM

## 2016-06-17 NOTE — Progress Notes (Addendum)
Recreation Therapy Notes  INPATIENT RECREATION THERAPY ASSESSMENT  Patient Details Name: Leonard Guerrero MRN: [id number] DOB: 1975/04/05 Today's Date: 06/17/2016  Patient Stressors:  (Pt stated "I don't know")  Pt stated he was here for overdosing.  Coping Skills:   Isolate, Substance Abuse, Avoidance, Self-Injury, Talking, Music  Personal Challenges: Anger, Communication, Concentration, Decision-Making, Expressing Yourself, Self-Esteem/Confidence, Social Interaction, Stress Management  Leisure Interests (2+):   ("I don't know")  Awareness of Community Resources:  Yes  Community Resources:  Research scientist (physical sciences)Movie Theaters, Tree surgeonMall  Current Use: No  If no, Barriers?: Other (Comment)  Patient Strengths:  "I don't know"  Patient Identified Areas of Improvement:  "All of them"  Current Recreation Participation:  "None"  Patient Goal for Hospitalization:  "Get out of here"  [name]  [place]idence[name]  Current SI (including self-harm):  No  Current HI:  No  Consent to Intern Participation: N/A   Caroll RancherMarjette Marieta Markov, LRT/CTRS  Caroll RancherLindsay, Shaleka Brines A 06/17/2016, 2:08 PM

## 2016-06-17 NOTE — Progress Notes (Signed)
Nursing Note 06/17/2016 [id number]  Data Reports sleeping well- did not complete self-inventory sheet. Affect flat.  Denies HI, SI, AVH.  Patient minimal and isolative.   Action Spoke with patient 1:1, nurse offered support to patient throughout shift.  Continues to be monitored on 15 minute checks for safety.  Response Remains safe on unit though isolative.

## 2016-06-18 DIAGNOSIS — R7989 Other specified abnormal findings of blood chemistry: Secondary | ICD-10-CM | POA: Clinically undetermined

## 2016-06-18 LAB — LIPID PANEL
Cholesterol: 213 mg/dL — ABNORMAL HIGH (ref 0–200)
HDL: 35 mg/dL — ABNORMAL LOW
LDL Cholesterol: 142 mg/dL — ABNORMAL HIGH (ref 0–99)
Total CHOL/HDL Ratio: 6.1 ratio
Triglycerides: 181 mg/dL — ABNORMAL HIGH
VLDL: 36 mg/dL (ref 0–40)

## 2016-06-18 LAB — TSH: TSH: 4.681 u[IU]/mL — AB (ref 0.350–4.500)

## 2016-06-18 NOTE — Progress Notes (Signed)
Pt has spent most of the evening in his room.  He did not go to evening group tonight.  Pt denies SI/HI/AVH with Clinical research associatewriter.  He denies any withdrawal symptoms at this time.  Conversation with pt was minimal and he forwarded very little information.  He says he [place] to go to [place] at [place] to stay with his daughter.  Pt voiced no needs or concerns.  Support and encouragement offered.  Discharge plans are in process.  Safety maintained with q15 minute checks.

## 2016-06-18 NOTE — Progress Notes (Signed)
[facility] Note  06/18/2016 4:09 PM Leonard ComfortRicky Guerrero  MRN:  [id number] Subjective: Patient states " I am still anxious , but is better.'    Objective:Patient seen and chart reviewed.Discussed patient with treatment team. Pt today seen as less anxious , less depressed, is tolerating medications well. Pt continues to want to return to [place]. Pt continues to need reassurance and support. No new concerns    Principal Problem: Schizophrenia Diagnosis:   Patient Active Problem List   Diagnosis Date Noted  . Elevated TSH [R94.6] 06/18/2016  . Schizophrenia (HCC) [F20.9] 06/17/2016  . Major depressive disorder with single episode [F32.9] 06/17/2016  . Alcohol use disorder, severe, dependence (HCC) [F10.20] 06/17/2016  . Cocaine use disorder, moderate, dependence (HCC) [F14.20] 06/17/2016  . Alcoholic intoxication without complication (HCC) [F10.920]   . Acute encephalopathy [G93.40] 06/08/2016  . Acute respiratory failure (HCC) [J96.00]   . Altered mental status [R41.82]   . Encephalopathy acute [G93.40] 06/07/2016   Total Time spent with patient: 20 minutes  Past Psychiatric History: As in H&P  Past Medical History:  Past Medical History:  Diagnosis Date  . Bipolar 1 disorder (HCC)   . ETOH abuse    History reviewed. No pertinent surgical history. Family History: History reviewed. No pertinent family history. Family Psychiatric  History: As in H&P  Social History:  History  Alcohol Use  . 1.8 - 2.4 oz/week  . 3 - 4 Cans of beer per week     History  Drug use: Unknown    Social History   Social History  . Marital status: Unknown    Spouse name: N/A  . Number of children: N/A  . Years of education: N/A   Social History Main Topics  . Smoking status: Current Every Day Smoker    Packs/day: 1.00    Years: 10.00    Types: Cigarettes  . Smokeless tobacco: Never Used  . Alcohol use 1.8 - 2.4 oz/week    3 - 4 Cans of beer per week  . Drug use: Unknown  . Sexual  activity: Not Asked   Other Topics Concern  . None   Social History Narrative  . None   Additional Social History:        Sleep: Fair  Appetite:  Fair  Current Medications: Current Facility-Administered Medications  Medication Dose Route Frequency Provider Last Rate Last Dose  . acamprosate (CAMPRAL) tablet 666 mg  666 mg Oral TID WC Izumi Mixon, Levin BaconSaramma, MD   666 mg at 06/18/16 1214  . acetaminophen (TYLENOL) tablet 650 mg  650 mg Oral Q6H PRN Nwoko, Nicole KindredAgnes I, NP      . alum & mag hydroxide-simeth (MAALOX/MYLANTA) 200-200-20 MG/5ML suspension 30 mL  30 mL Oral Q4H PRN Nwoko, Agnes I, NP      . benztropine (COGENTIN) tablet 2 mg  2 mg Oral BID Izediuno, Delight OvensVincent A, MD   2 mg at 06/18/16 0820  . escitalopram (LEXAPRO) tablet 5 mg  5 mg Oral Daily Cassadi Purdie, MD   5 mg at 06/18/16 0820  . folic acid (FOLVITE) tablet 1 mg  1 mg Oral Daily Nwoko, Agnes I, NP   1 mg at 06/18/16 0820  . hydrOXYzine (ATARAX/VISTARIL) tablet 25 mg  25 mg Oral Q6H PRN Armandina StammerNwoko, Agnes I, NP   25 mg at 06/17/16 2104  . magnesium hydroxide (MILK OF MAGNESIA) suspension 30 mL  30 mL Oral Daily PRN Armandina StammerNwoko, Agnes I, NP   30 mL at 06/16/16 1705  .  nicotine polacrilex (NICORETTE) gum 2 mg  2 mg Oral PRN Armandina Stammer I, NP   2 mg at 06/18/16 1216  . [START ON 06/27/2016] Paliperidone Palmitate SUSP 546 mg  546 mg Intramuscular Q90 days Tarina Volk, Levin Bacon, MD      . thiamine tablet 100 mg  100 mg Oral Daily Armandina Stammer I, NP   100 mg at 06/18/16 0981  . traZODone (DESYREL) tablet 50 mg  50 mg Oral QHS PRN Armandina Stammer I, NP   50 mg at 06/17/16 2105    Lab Results:  Results for orders placed or performed during the hospital encounter of 06/13/16 (from the past 48 hour(s))  TSH     Status: Abnormal   Collection Time: 06/18/16  6:30 AM  Result Value Ref Range   TSH 4.681 (H) 0.350 - 4.500 uIU/mL    Comment: Performed by a 3rd Generation assay with a functional sensitivity of <=0.01 uIU/mL. Performed at [facility], [address], [place], [place] [address]   Lipid panel     Status: Abnormal   Collection Time: 06/18/16  6:30 AM  Result Value Ref Range   Cholesterol 213 (H) 0 - 200 mg/dL   Triglycerides 829 (H) <150 mg/dL   HDL 35 (L) >56 mg/dL   Total CHOL/HDL Ratio 6.1 RATIO   VLDL 36 0 - 40 mg/dL   LDL Cholesterol 213 (H) 0 - 99 mg/dL    Comment:        Total Cholesterol/HDL:CHD Risk Coronary Heart Disease Risk Table                     Men   Women  1/2 Average Risk   3.4   3.3  Average Risk       5.0   4.4  2 X Average Risk   9.6   7.1  3 X Average Risk  23.4   11.0        Use the calculated Patient Ratio above and the CHD Risk Table to determine the patient's CHD Risk.        ATP III CLASSIFICATION (LDL):  <100     mg/dL   Optimal  086-578  mg/dL   Near or Above                    Optimal  130-159  mg/dL   Borderline  469-629  mg/dL   High  >528     mg/dL   Very High Performed at [facility], [address] [place], [place] [address]     Blood Alcohol level:  Lab Results  Component Value Date   ETH 16 (H) 06/07/2016    Metabolic Disorder Labs: No results found for: HGBA1C, MPG No results found for: PROLACTIN Lab Results  Component Value Date   CHOL 213 (H) 06/18/2016   TRIG 181 (H) 06/18/2016   HDL 35 (L) 06/18/2016   CHOLHDL 6.1 06/18/2016   VLDL 36 06/18/2016   LDLCALC 142 (H) 06/18/2016    Physical Findings: AIMS: Facial and Oral Movements Muscles of Facial Expression: None, normal Lips and Perioral Area: Mild Jaw: None, normal Tongue: Mild,Extremity Movements Upper (arms, wrists, hands, fingers): Minimal Lower (legs, knees, ankles, toes): None, normal, Trunk Movements Neck, shoulders, hips: None, normal, Overall Severity Severity of abnormal movements (highest score from questions above): Minimal Incapacitation due to abnormal movements: None, normal Patient's awareness of abnormal movements (rate only patient's report): Aware, no  distress, Dental Status Current problems with teeth and/or dentures?: Yes Does patient usually wear dentures?: No  CIWA:  CIWA-Ar Total: 1 COWS:     Musculoskeletal: Strength & Muscle Tone: within normal limits Gait & Station: normal Patient leans: N/A  Psychiatric Specialty Exam: Physical Exam  Nursing note and vitals reviewed.   Review of Systems  Psychiatric/Behavioral: Positive for depression and substance abuse. The patient is nervous/anxious.   All other systems reviewed and are negative.   Blood pressure 113/73, pulse 77, temperature 97.7 F (36.5 C), temperature source Oral, resp. rate 16, height 6' (1.829 m), weight 86.2 kg (190 lb), SpO2 97 %.Body mass index is 25.77 kg/m.  General Appearance: calm  Eye Contact:  Fair  Speech:  Normal Rate  Volume:  Normal  Mood:  Anxious  Affect:  Appropriate  Thought Process:  Goal Directed and Descriptions of Associations: Intact  Orientation:  Full (Time, Place, and Person)  Thought Content: rumination improving  Suicidal Thoughts:  No  Homicidal Thoughts:  No  Memory:  Immediate;   Fair Recent;   Fair Remote;   Fair  Judgement:  Fair  Insight:  Fair  Psychomotor Activity:  Tremor  Concentration:  Concentration: Fair and Attention Span: Fair  Recall:  [employer] of Knowledge:  Fair  Language:  Fair  Akathisia:  No  Handed:    AIMS (if indicated):     Assets:  Communication Skills Desire for Improvement Physical Health Resilience  ADL's:  Intact  Cognition:  WNL  Sleep:  Number of Hours: 6.75   Schizophrenia (HCC) improving  Will continue today 06/18/16  plan as below except where it is noted.      Treatment Plan Summary:Patient presented S/P suicide attempt requiring ICU admission, is making progress , reports anxiety as responding to current medication regimen. Will continue to treat.  Plan:  Will continue Paliperidone Palmitate 546 mg - q3 months , next dose 06/27/16. Will continue Lexapro 5 mg po  daily for affective sx. Will continue Cogentin 2 mg po bid for EPS. Will add Campral 666 mg po tid for alcohol abuse/craving. Will continue Trazodone 50 mg po qhs prn for sleep issues. Labs reviewed - lipid panel - abnormal - dietician consult, tsh - high - will order t3, t4. CSW will continue to work on disposition.    Morningstar Toft, MD 06/18/2016, 4:09 PM

## 2016-06-18 NOTE — Progress Notes (Signed)
DAR NOTE: Patient presents with flat affect and depressed mood.  Denies suicidal thoughts, auditory and visual hallucinations.  Remained withdrawn and isolative to his room.  Patient is compliant with medication regimen.  Routine safety checks maintained.  Patient is safe on the unit.  No complaint offered.  Support and encouragement offered as needed.

## 2016-06-18 NOTE — [facility] Group Notes (Signed)
[facility] Group Therapy 06/18/2016 1:15pm  Type of Therapy: Mental Health Association Presentation  Participation Level: Active  Participation Quality: Attentive  Affect: Appropriate  Cognitive: Oriented  Insight: Developing/Improving  Engagement in Therapy: Engaged  Modes of Intervention: Discussion, Education and Socialization  Summary of Progress/Problems: Mental Health Association (MHA) Speaker came to talk about his personal journey with substance abuse and addiction. The pt processed ways by which to relate to the speaker. MHA speaker provided handouts and educational information pertaining to groups and services offered by the [facility]. Pt was engaged in speaker's presentation and was receptive to resources provided.    Vernie ShanksLauren Marlenne Ridge, LCSW 06/18/2016 2:58 PM

## 2016-06-18 NOTE — Progress Notes (Signed)
Recreation Therapy Notes  Animal-Assisted Activity (AAA) Program Checklist/Progress Notes Patient Eligibility Criteria Checklist & Daily Group note for Rec TxIntervention  Date: 05.15.2018 Time: 2:45pm [address]    AAA/T Program Assumption of Risk Form signed by Patient/ or Parent Legal Guardian Yes  Patient is free of allergies or sever asthma Yes  Patient reports no fear of animals Yes  Patient reports no history of cruelty to animals Yes  Patient understands his/her participation is voluntary Yes  Behavioral Response: Did not attend.   Leonard Guerrero, LRT/CTRS        Jearl KlinefelterBlanchfield, [name] 06/18/2016 2:56 PM

## 2016-06-18 NOTE — Progress Notes (Signed)
Pt continues to isolate to his room most of the time, coming out only for snacks and meals.  She did not attend evening group, but chose to stay in his room.  He denies SI/HI/AVH.  He denies having any withdrawal symptoms.  He reports that his plans is still to go to [place] to stay with [place] daughter.  He is med compliant on the unit, and makes his needs known to staff.  Support and encouragement offered.  Discharge plans are in process.  Safety maintained with q15 minute checks.

## 2016-06-19 LAB — PROLACTIN: PROLACTIN: 28.5 ng/mL — AB (ref 4.0–15.2)

## 2016-06-19 LAB — T4, FREE: FREE T4: 0.88 ng/dL (ref 0.61–1.12)

## 2016-06-19 LAB — HEMOGLOBIN A1C
Hgb A1c MFr Bld: 5.1 % (ref 4.8–5.6)
Mean Plasma Glucose: 100 mg/dL

## 2016-06-19 MED ORDER — FOLIC ACID 1 MG PO TABS: 1.0000 mg | ORAL_TABLET | Freq: Every day | ORAL | 0 refills | Status: DC

## 2016-06-19 MED ORDER — THIAMINE HCL 100 MG PO TABS: 100.0000 mg | ORAL_TABLET | Freq: Every day | ORAL | 0 refills | Status: DC

## 2016-06-19 MED ORDER — NICOTINE POLACRILEX 2 MG MT GUM: 2.0000 mg | CHEWING_GUM | OROMUCOSAL | 0 refills | Status: DC | PRN

## 2016-06-19 MED ORDER — PALIPERIDONE PALMITATE 546 MG/1.75ML IM SUSP: 546.0000 mg | INTRAMUSCULAR | 0 refills | Status: DC

## 2016-06-19 MED ORDER — TRAZODONE HCL 50 MG PO TABS: 50.0000 mg | ORAL_TABLET | Freq: Every evening | ORAL | 0 refills | Status: DC | PRN

## 2016-06-19 MED ORDER — ACAMPROSATE CALCIUM 333 MG PO TBEC: 666.0000 mg | DELAYED_RELEASE_TABLET | Freq: Three times a day (TID) | ORAL | 0 refills | Status: DC

## 2016-06-19 MED ORDER — ESCITALOPRAM OXALATE 5 MG PO TABS: 5.0000 mg | ORAL_TABLET | Freq: Every day | ORAL | 0 refills | Status: DC

## 2016-06-19 MED ORDER — HYDROXYZINE HCL 25 MG PO TABS: 25.0000 mg | ORAL_TABLET | Freq: Four times a day (QID) | ORAL | 0 refills | Status: DC | PRN

## 2016-06-19 MED ORDER — AMOXICILLIN-POT CLAVULANATE 875-125 MG PO TABS: 1.0000 | ORAL_TABLET | Freq: Two times a day (BID) | ORAL | 0 refills | Status: AC

## 2016-06-19 MED ORDER — BENZTROPINE MESYLATE 2 MG PO TABS: 2.0000 mg | ORAL_TABLET | Freq: Two times a day (BID) | ORAL | 0 refills | Status: DC

## 2016-06-19 NOTE — Progress Notes (Signed)
Recreation Therapy Notes  Date: 06/19/16 Time: 0930 Location: [address] Day[address]  Group Topic: Stress Management  Goal Area(s) Addresses:  Patient will verbalize importance of using healthy stress management.  Patient will identify positive emotions associated with healthy stress management.   Intervention: Stress Management  Activity :  Body Scan Meditation.  LRT introduced the stress management technique of meditation.  LRT played a meditation from the Calm app to allow patients the opportunity to scan and acknowledge the sensations and tension within the body.  Patients were to follow along as the meditation played to engage in the activity.  Education:  Stress Management, Discharge Planning.   Education Outcome: Acknowledges edcuation/In group clarification offered/Needs additional education  Clinical Observations/Feedback: Pt did not attend group.   Caroll RancherMarjette Aariz Maish, LRT/CTRS         Caroll RancherLindsay, Leshonda Galambos A 06/19/2016 12:51 PM

## 2016-06-19 NOTE — Discharge Summary (Signed)
Physician Discharge Summary Note  Patient:  Leonard Guerrero is an 41 y.o., male MRN:  [id number] DOB:  10/20/1975 Patient phone:  [phone] (home)  Patient address:   [address] [place] [address],  Total Time spent with patient: Greater than 30 minutes  Date of Admission:  06/13/2016 Date of Discharge: 06-19-16  Reason for Admission: Worsening symptoms of Schizophrenia.  Principal Problem: Schizophrenia [facility](HCC) Discharge Diagnoses: Patient Active Problem List   Diagnosis Date Noted  . Elevated TSH [R94.6] 06/18/2016  . Schizophrenia (HCC) [F20.9] 06/17/2016  . Major depressive disorder with single episode [F32.9] 06/17/2016  . Alcohol use disorder, severe, dependence (HCC) [F10.20] 06/17/2016  . Cocaine use disorder, moderate, dependence (HCC) [F14.20] 06/17/2016  . Alcoholic intoxication without complication (HCC) [F10.920]   . Acute encephalopathy [G93.40] 06/08/2016  . Acute respiratory failure (HCC) [J96.00]   . Altered mental status [R41.82]   . Encephalopathy acute [G93.40] 06/07/2016   Past Psychiatric History: Schizophrenia  Past Medical History:  Past Medical History:  Diagnosis Date  . Bipolar 1 disorder (HCC)   . ETOH abuse    History reviewed. No pertinent surgical history. Family History: History reviewed. No pertinent family history.  Family Psychiatric  History: See H&P  Social History:  History  Alcohol Use  . 1.8 - 2.4 oz/week  . 3 - 4 Cans of beer per week     History  Drug use: Unknown    Social History   Social History  . Marital status: Unknown    Spouse name: N/A  . Number of children: N/A  . Years of education: N/A   Social History Main Topics  . Smoking status: Current Every Day Smoker    Packs/day: 1.00    Years: 10.00    Types: Cigarettes  . Smokeless tobacco: Never Used  . Alcohol use 1.8 - 2.4 oz/week    3 - 4 Cans of beer per week  . Drug use: Unknown  . Sexual activity: Not Asked   Other Topics Concern  . None    Social History Narrative  . None   Hospital Course:  This is an admission assessment for this 41 year old Caucasian male with hx of Paranoid Schizophrenia. Leonard Guerrero is being [facility] with complaints of intentional suicide attempt on a mixture of drugs/alcohol overdose. He was taken to [facility] last Friday where he was in the ICU for medical stabilization. Hx. Prison term 9 years ago. During this assessment, Leonard Guerrero reports, "I was [facility] last Friday. I don't know how I got there. I had overdosed on a mixture of pills. All I remember is, I was drinking, but, I don't remember getting drunk & what happened afterwards. I was messing with my [place] in [place] to allow me to come live with her. I did the intentional overdose to get her attention, but not to kill myself. I don't want to die. I take medication for Paranoid Schizophrenia. I was diagnosed over 9 years ago & I have been on medication since then. I was on Q monthly Invega shots, but, was changed to Q 3 months 6 months ago. My shot is due to be given on the 24th of this month [facility]. I have not heard any voices or been suicidal in a long time. I have no history of attempts prior to this. I'm not depressed or anxious. I just want to be turned loose to go home. The  trick worked. My daughter will be coming to get me back to [place] to live her again in the few weeks".  After evaluation of his presenting symptoms, Leonard Guerrero was started on medication regimen targeting his presenting symptoms. He was medicated & discharged on; Campral 666 mg for alcoholism, Benztropine 5 mg for prevention of EPS, Lexapro 5 mg for depression & Paliperidone injectable 546 mg for mood control. He also received an antibiotic therapy for the other medical issues presented. Leonard Guerrero tolerated his treatment regimen without any adverse effects or reatctions reported. He was also enrolled & participated in the group  sessions being offered & held on this unit. He learned coping skills.  Leonard Guerrero is seen today. Pleased that he was made to sought help. Says he is tolerating his medications well. No withdrawal symptoms. No craving for alcohol. He is no longer feeling depressed. Describes normal energy and ability to think. Able to focus on task. No suicidal thoughts. No homicidal thoughts. No thoughts of violence. Leonard Guerrero reports normal biological functions.   Nursing staff reports that patient has been appropriate on the unit. Patient has been interacting well with peers & staff. No behavioral issues. Patient has not voiced any suicidal thoughts. Patient has not been observed to be internally stimulated or occupied. Patient has been adherent with treatment recommendations. Patient has been tolerating his  medication well, denies any adverse reactions or side effects.   Patient was discussed at team meeting this morning. Team members feels that patient is back to his baseline level of function. Team agrees with plan to discharge patient today. Upon discharge, Leonard Guerrero adamantly denies any SIHI, AVH, delusional thoughts, paranoia or substance withdrawal symptoms. He will continue further psychiatric follow-up care/medication management on an outpatient basis as noted below. He was provided with all the necessary information needed to make this appointment without problems. He left [facility] with all personal belongings in no apparent distress. Transportation per mother.  Physical Findings: AIMS: Facial and Oral Movements Muscles of Facial Expression: None, normal Lips and Perioral Area: Mild Jaw: None, normal Tongue: Mild,Extremity Movements Upper (arms, wrists, hands, fingers): Minimal Lower (legs, knees, ankles, toes): None, normal, Trunk Movements Neck, shoulders, hips: None, normal, Overall Severity Severity of abnormal movements (highest score from questions above): Minimal Incapacitation due to abnormal movements: None,  normal Patient's awareness of abnormal movements (rate only patient's report): Aware, no distress, Dental Status Current problems with teeth and/or dentures?: Yes Does patient usually wear dentures?: No  CIWA:  CIWA-Ar Total: 1 COWS:     Musculoskeletal: Strength & Muscle Tone: within normal limits Gait & Station: normal Patient leans: N/A  Psychiatric Specialty Exam: Physical Exam  Constitutional: He appears well-developed.  HENT:  Head: Normocephalic.  Eyes: Pupils are equal, round, and reactive to light.  Neck: Normal range of motion.  Cardiovascular: Normal rate.   Respiratory: Effort normal.  GI: Soft.  Genitourinary:  Genitourinary Comments: Deferred  Musculoskeletal: Normal range of motion.  Neurological: He is alert.  Skin: Skin is warm.    Review of Systems  Constitutional: Negative.   HENT: Negative.   Eyes: Negative.   Respiratory: Negative.   Cardiovascular: Negative.   Gastrointestinal: Negative.   Genitourinary: Negative.   Musculoskeletal: Negative.   Skin: Negative.   Neurological: Negative.   Endo/Heme/Allergies: Negative.   Psychiatric/Behavioral: Positive for depression (Stable) and substance abuse (Hx. alcohol/cocaine use disorder ). Negative for hallucinations, memory loss and suicidal ideas. The patient has insomnia (Stable). The patient is not nervous/anxious.  Blood pressure 108/76, pulse 70, temperature 97.7 F (36.5 C), temperature source Oral, resp. rate 18, height 6' (1.829 m), weight 86.2 kg (190 lb), SpO2 97 %.Body mass index is 25.77 kg/m.  See Md's SRA   Have you used any form of tobacco in the last 30 days? (Cigarettes, Smokeless Tobacco, Cigars, and/or Pipes): Yes  Has this patient used any form of tobacco in the last 30 days? (Cigarettes, Smokeless Tobacco, Cigars, and/or Pipes): Yes, provided with nicorette gum prescription upon discharge.  Blood Alcohol level:  Lab Results  Component Value Date   ETH 16 (H) 06/07/2016     Metabolic Disorder Labs:  Lab Results  Component Value Date   HGBA1C 5.1 06/18/2016   MPG 100 06/18/2016   Lab Results  Component Value Date   PROLACTIN 28.5 (H) 06/18/2016   Lab Results  Component Value Date   CHOL 213 (H) 06/18/2016   TRIG 181 (H) 06/18/2016   HDL 35 (L) 06/18/2016   CHOLHDL 6.1 06/18/2016   VLDL 36 06/18/2016   LDLCALC 142 (H) 06/18/2016   See Psychiatric Specialty Exam and Suicide Risk Assessment completed by Attending Physician prior to discharge.  Discharge destination:  Home  Is patient on multiple antipsychotic therapies at discharge:  No   Has Patient had three or more failed trials of antipsychotic monotherapy by history:  No  Recommended Plan for Multiple Antipsychotic Therapies: NA  Allergies as of 06/19/2016   No Known Allergies     Medication List    STOP taking these medications   nicotine 14 mg/24hr patch Commonly known as:  NICODERM CQ - dosed in mg/24 hours     TAKE these medications     Indication  acamprosate 333 MG tablet Commonly known as:  CAMPRAL Take 2 tablets (666 mg total) by mouth 3 (three) times daily with meals. For alcoholism  Indication:  Excessive Use of Alcohol   amoxicillin-clavulanate 875-125 MG tablet Commonly known as:  AUGMENTIN Take 1 tablet by mouth every 12 (twelve) hours. For infection What changed:  additional instructions  Indication:  Infection   benztropine 2 MG tablet Commonly known as:  COGENTIN Take 1 tablet (2 mg total) by mouth 2 (two) times daily. For prevention of drug induced tremors.  Indication:  Extrapyramidal Reaction caused by Medications   escitalopram 5 MG tablet Commonly known as:  LEXAPRO Take 1 tablet (5 mg total) by mouth daily. For depression Start taking on:  06/20/2016  Indication:  Major Depressive Disorder   folic acid 1 MG tablet Commonly known as:  FOLVITE Take 1 tablet (1 mg total) by mouth daily. For low Folate replacement What changed:  additional  instructions  Indication:  Anemia From Inadequate Folic Acid   hydrOXYzine 25 MG tablet Commonly known as:  ATARAX/VISTARIL Take 1 tablet (25 mg total) by mouth every 6 (six) hours as needed for anxiety (Sleep).  Indication:  Anxiety Neurosis, Insomnia   nicotine polacrilex 2 MG gum Commonly known as:  NICORETTE Take 1 each (2 mg total) by mouth as needed for smoking cessation.  Indication:  Nicotine Addiction   Paliperidone Palmitate 546 MG/1.75ML Susp Inject 546 mg into the muscle every 3 (three) months. (Due 06-27-16): For mood control Start taking on:  06/27/2016 What changed:  how much to take  additional instructions  Indication:  Mood control   thiamine 100 MG tablet Take 1 tablet (100 mg total) by mouth daily. For low thiamine replacement What changed:  additional instructions  Indication:  Deficiency  in Thiamine or Vitamin B1   traZODone 50 MG tablet Commonly known as:  DESYREL Take 1 tablet (50 mg total) by mouth at bedtime as needed for sleep.  Indication:  Trouble Sleeping      Follow-up Information    [facility] Follow up.   Why:  Please walk in within 7 days of hosptial discharge. Open Access Hours: Monday through Friday between 8am-4pm. Thank you.  Contact information: [address] [place], [place] [address] Phone: [phone] Fax:          Follow-up recommendations: Activity:  As tolerated Diet: As recommended by your primary care doctor. Keep all scheduled follow-up appointments as recommended.   Comments: Patient is instructed prior to discharge to: Take all medications as prescribed by his/her mental healthcare provider. Report any adverse effects and or reactions from the medicines to his/her outpatient provider promptly. Patient has been instructed & cautioned: To not engage in alcohol and or illegal drug use while on prescription medicines. In the event of worsening symptoms, patient is instructed to call the crisis  hotline, 911 and or go to the nearest ED for appropriate evaluation and treatment of symptoms. To follow-up with his/her primary care provider for your other medical issues, concerns and or health care needs.   Signed: Sanjuana Kava, NP, PMHNP, FNP-BC 06/19/2016, 9:40 AM

## 2016-06-19 NOTE — Progress Notes (Signed)
Clide CliffRicky was D/C from the unit to lobby accompanied by family.  He was pleasant and cooperative. He voiced no SI/HI or A/V halluciations.  He denies any pain or discomfort.  D/C instructions and medications reviewed with pt.  Pt. verbalized understanding of medications and d/c instructions.   All belongings (from locker 46) returned to pt. Q 15 min checks maintained until discharge.  Kirt left the unit in no apparent distress.

## 2016-06-19 NOTE — Progress Notes (Signed)
Nutrition Note  Consult for hyperlipidemia education received. Per protocol, RN to provide pt with copy of packet outlining general healthy eating in addition to hyperlipidemia-specific information.   Labs reviewed; cholesterol 213 mg/dL, HDL 35 mg/dL, LDL 409142 mg/dL, triglycerides 811181 mg/dL. No further nutrition intervention warranted at this time. Please re-consult if further nutrition-related issues arise.    Trenton GammonJessica Misty Foutz, MS, RD, LDN, [facility] Inpatient Clinical Dietitian Pager # [phone] After hours/weekend pager # [phone]

## 2016-06-19 NOTE — Tx Team (Signed)
Interdisciplinary Treatment and Diagnostic Plan Update  06/19/2016 Time of Session: 0930 Leonard ComfortRicky Guerrero MRN: [id number]  Principal Diagnosis: MDD  Secondary Diagnoses: Principal Problem:   Schizophrenia (HCC) Active Problems:   Major depressive disorder with single episode   Alcohol use disorder, severe, dependence (HCC)   Cocaine use disorder, moderate, dependence (HCC)   Elevated TSH   Current Medications:  Current Facility-Administered Medications  Medication Dose Route Frequency Provider Last Rate Last Dose  . acamprosate (CAMPRAL) tablet 666 mg  666 mg Oral TID WC Jomarie LongsEappen, Saramma, MD   666 mg at 06/19/16 0627  . acetaminophen (TYLENOL) tablet 650 mg  650 mg Oral Q6H PRN Nwoko, Agnes I, NP      . alum & mag hydroxide-simeth (MAALOX/MYLANTA) 200-200-20 MG/5ML suspension 30 mL  30 mL Oral Q4H PRN Nwoko, Agnes I, NP      . benztropine (COGENTIN) tablet 2 mg  2 mg Oral BID Georgiann CockerIzediuno, Vincent A, MD   2 mg at 06/19/16 [id number]  . escitalopram (LEXAPRO) tablet 5 mg  5 mg Oral Daily Jomarie LongsEappen, Saramma, MD   5 mg at 06/19/16 0833  . folic acid (FOLVITE) tablet 1 mg  1 mg Oral Daily Nwoko, Agnes I, NP   1 mg at 06/19/16 0833  . hydrOXYzine (ATARAX/VISTARIL) tablet 25 mg  25 mg Oral Q6H PRN Armandina StammerNwoko, Agnes I, NP   25 mg at 06/18/16 2105  . magnesium hydroxide (MILK OF MAGNESIA) suspension 30 mL  30 mL Oral Daily PRN Armandina StammerNwoko, Agnes I, NP   30 mL at 06/16/16 1705  . nicotine polacrilex (NICORETTE) gum 2 mg  2 mg Oral PRN Armandina StammerNwoko, Agnes I, NP   2 mg at 06/19/16 0834  . [START ON 06/27/2016] Paliperidone Palmitate SUSP 546 mg  546 mg Intramuscular Q90 days Eappen, Levin BaconSaramma, MD      . thiamine tablet 100 mg  100 mg Oral Daily Armandina StammerNwoko, Agnes I, NP   100 mg at 06/18/16 [id number]  . traZODone (DESYREL) tablet 50 mg  50 mg Oral QHS PRN Armandina StammerNwoko, Agnes I, NP   50 mg at 06/18/16 2105   PTA Medications: Prescriptions Prior to Admission  Medication Sig Dispense Refill Last Dose  . nicotine (NICODERM CQ - DOSED IN MG/24 HOURS) 14  mg/24hr patch Place 1 patch (14 mg total) onto the skin daily. 28 patch 0   . Paliperidone Palmitate (INVEGA TRINZA) 546 MG/1.75ML SUSP Inject 1.75 mLs into the muscle every 3 (three) months.   Feb 2018 at Unknown  . thiamine 100 MG tablet Take 1 tablet (100 mg total) by mouth daily. 30 tablet 1   . [DISCONTINUED] amoxicillin-clavulanate (AUGMENTIN) 875-125 MG tablet Take 1 tablet by mouth every 12 (twelve) hours. 10 tablet 0   . [DISCONTINUED] folic acid (FOLVITE) 1 MG tablet Take 1 tablet (1 mg total) by mouth daily. 30 tablet 1     Patient Stressors: Educational concerns Financial difficulties Occupational concerns Substance abuse  Patient Strengths: Average or above average intelligence Capable of independent living Communication skills Physical Health Supportive family/friends  Treatment Modalities: Medication Management, Group therapy, Case management,  1 to 1 session with clinician, Psychoeducation, Recreational therapy.   Physician Treatment Plan for Primary Diagnosis: MDD  Medication Management: Evaluate patient's response, side effects, and tolerance of medication regimen.  Therapeutic Interventions: 1 to 1 sessions, Unit Group sessions and Medication administration.  Evaluation of Outcomes: Adequate for Discharge  Physician Treatment Plan for Secondary Diagnosis: Principal Problem:   Schizophrenia [facility](HCC) Active Problems:   Major  depressive disorder with single episode   Alcohol use disorder, severe, dependence (HCC)   Cocaine use disorder, moderate, dependence (HCC)   Elevated TSH  Long Term Goal(s): Improvement in symptoms so as ready for discharge Improvement in symptoms so as ready for discharge   Short Term Goals: Ability to identify changes in lifestyle to reduce recurrence of condition will improve Ability to verbalize feelings will improve Ability to disclose and discuss suicidal ideas Ability to demonstrate self-control will improve Ability to identify  and develop effective coping behaviors will improve Compliance with prescribed medications will improve Ability to identify triggers associated with substance abuse/mental health issues will improve     Medication Management: Evaluate patient's response, side effects, and tolerance of medication regimen.  Therapeutic Interventions: 1 to 1 sessions, Unit Group sessions and Medication administration.  Evaluation of Outcomes: Adequate for Discharge   RN Treatment Plan for Primary Diagnosis :MDD Long Term Goal(s): Knowledge of disease and therapeutic regimen to maintain health will improve  Short Term Goals: Ability to remain free from injury will improve, Ability to verbalize feelings will improve and Ability to disclose and discuss suicidal ideas  Medication Management: RN will administer medications as ordered by provider, will assess and evaluate patient's response and provide education to patient for prescribed medication. RN will report any adverse and/or side effects to prescribing provider.  Therapeutic Interventions: 1 on 1 counseling sessions, Psychoeducation, Medication administration, Evaluate responses to treatment, Monitor vital signs and CBGs as ordered, Perform/monitor CIWA, COWS, AIMS and Fall Risk screenings as ordered, Perform wound care treatments as ordered.  Evaluation of Outcomes: Adequate for Discharge   LCSW Treatment Plan for Primary Diagnosis:MDD  Long Term Goal(s): Safe transition to appropriate next level of care at discharge, Engage patient in therapeutic group addressing interpersonal concerns.  Short Term Goals: Engage patient in aftercare planning with referrals and resources, Facilitate patient progression through stages of change regarding substance use diagnoses and concerns and Identify triggers associated with mental health/substance abuse issues  Therapeutic Interventions: Assess for all discharge needs, 1 to 1 time with Social worker, Explore available  resources and support systems, Assess for adequacy in community support network, Educate family and significant other(s) on suicide prevention, Complete Psychosocial Assessment, Interpersonal group therapy.  Evaluation of Outcomes: Adequate for Discharge   Progress in Treatment: Attending groups: No. Participating in groups: No. New to unit. Continuing to assess.  Taking medication as prescribed: Yes. Toleration medication: Yes. Family/Significant other contact made: Yes with mother regarding discharge plan Patient understands diagnosis: Yes. Discussing patient identified problems/goals with staff: Yes. Medical problems stabilized or resolved: Yes. Denies suicidal/homicidal ideation: Yes. Issues/concerns per patient self-inventory: No. Other: n/a   New problem(s) identified: No, Describe:  n/a  New Short Term/Long Term Goal(s): detox; medication stabilization, development of comprehensive mental wellness/sobriety plan.   Discharge Plan or Barriers: Pt will return to boarding house temporarily until getting his next injection at [facility]. He will then relocate to [facility] where his daughter lives. Pt will follow-up with [facility]  Reason for Continuation of Hospitalization:  None identified at this time.   Estimated Length of Stay: 0 days   Attendees: Patient: 06/19/2016 9:48 AM  Physician: Dr Elna Breslow, MD 06/19/2016 9:48 AM  Nursing: Noreene Filbert RN 06/19/2016 9:48 AM  RN Care Manager: Onnie Boer CM 06/19/2016 9:48 AM  Social Worker: Vernie Shanks, LCSW; Daryel Gerald, LCSW 06/19/2016 9:48 AM  Recreational Therapist: Royston Cowper 06/19/2016 9:48 AM  Other:  06/19/2016 9:48 AM  Other:  06/19/2016 9:48 AM  Other: 06/19/2016 9:48 AM    Scribe for Treatment Team: Verdene Lennert, LCSW 06/19/2016 9:48 AM

## 2016-06-19 NOTE — Progress Notes (Signed)
  [facility] Adult Case Management Discharge Plan :  Will you be returning to the same living situation after discharge:  Yes,  Pt returing to room at boarding house At discharge, do you have transportation home?: Yes,  Pt mother to pick up Do you have the ability to pay for your medications: Yes,  pt provided with prescriptions  Release of information consent forms completed and in the chart;  Patient's signature needed at discharge.  Patient to Follow up at: Follow-up Information    [facility] Follow up.   Why:  Please walk in to this agency in order to be established for outpatient services. Open Access Hours: Monday through Friday between 8am-4pm. Thank you.  Contact information: [address] [place], [place] [address] Phone: [phone] Fax: [phone]       [facility] Follow up.   Specialty:  Behavioral Health Why:  5/24 at 11:20am for you next injection. At this time, they can reassign you to a new therapist. You may walk-in between 8am-3pm if you need to be seen prior to this appointment.  Contact information: [address] [place] [address] [phone]           Next level of care provider has access to [facility] Link:no  Safety Planning and Suicide Prevention discussed: Yes,  with Pt; declines SPE with family  Have you used any form of tobacco in the last 30 days? (Cigarettes, Smokeless Tobacco, Cigars, and/or Pipes): Yes  Has patient been referred to the Quitline?: Patient refused referral  Patient has been referred for addiction treatment: Yes  Verdene LennertLauren C Feleica Fulmore 06/19/2016, 9:46 AM

## 2016-06-19 NOTE — BHH Suicide Risk Assessment (Signed)
[facility]   Principal Problem: Schizophrenia [facility](HCC) Discharge Diagnoses:  Patient Active Problem List   Diagnosis Date Noted  . Elevated TSH [R94.6] 06/18/2016  . Schizophrenia (HCC) [F20.9] 06/17/2016  . Major depressive disorder with single episode [F32.9] 06/17/2016  . Alcohol use disorder, severe, dependence (HCC) [F10.20] 06/17/2016  . Cocaine use disorder, moderate, dependence (HCC) [F14.20] 06/17/2016  . Alcoholic intoxication without complication (HCC) [F10.920]   . Acute encephalopathy [G93.40] 06/08/2016  . Acute respiratory failure (HCC) [J96.00]   . Altered mental status [R41.82]   . Encephalopathy acute [G93.40] 06/07/2016    Total Time spent with patient: 30 minutes  Musculoskeletal: Strength & Muscle Tone: within normal limits Gait & Station: normal Patient leans: N/A  Psychiatric Specialty Exam: ROS  Blood pressure 108/76, pulse 70, temperature 97.7 F (36.5 C), temperature source Oral, resp. rate 18, height 6' (1.829 m), weight 86.2 kg (190 lb), SpO2 97 %.Body mass index is 25.77 kg/m.  General Appearance: Casual  Eye Contact::  Fair  Speech:  Normal Rate409  Volume:  Normal  Mood:  Euthymic  Affect:  Congruent  Thought Process:  Goal Directed and Descriptions of Associations: Intact  Orientation:  Full (Time, Place, and Person)  Thought Content:  Logical  Suicidal Thoughts:  No  Homicidal Thoughts:  No  Memory:  Immediate;   Fair Recent;   Fair Remote;   Fair  Judgement:  Fair  Insight:  Fair  Psychomotor Activity:  Normal  Concentration:  Fair  Recall:  FiservFair  Fund of Knowledge:Fair  Language: Fair  Akathisia:  No  Handed:  Right  AIMS (if indicated):     Assets:  Communication Skills Desire for Improvement  Sleep:  Number of Hours: 6.75  Cognition: WNL  ADL's:  Intact   Mental Status Per Nursing Assessment::   On Admission:  NA  Demographic Factors:  Male and Caucasian  Loss Factors: NA  Historical  Factors: Impulsivity  Risk Reduction Factors:   Positive social support and Positive therapeutic relationship  Continued Clinical Symptoms:  Alcohol/Substance Abuse/Dependencies Previous Psychiatric Diagnoses and Treatments  Cognitive Features That Contribute To Risk:  None    Suicide Risk:  Minimal: No identifiable suicidal ideation.  Patients presenting with no risk factors but with morbid ruminations; may be classified as minimal risk based on the severity of the depressive symptoms  Follow-up Information    [facility].   Why:  Please walk in within 7 days of hosptial discharge. Open Access Hours: Monday through Friday between 8am-4pm. Thank you.  Contact information: [address] [place], [place] [id number] Phone: [phone] Fax:           Plan Of Care/Follow-up recommendations:  Activity:  no restrictions Diet:  regular Tests:  as needed, follow up on your prolactin level Other:  none  Starlee Corralejo, MD 06/19/2016, 9:32 AM

## 2016-06-20 ENCOUNTER — Encounter: Payer: Self-pay | Admitting: *Deleted

## 2016-06-20 LAB — T3: T3, Total: 107 ng/dL (ref 71–180)

## 2016-10-17 ENCOUNTER — Emergency Department (HOSPITAL_COMMUNITY)
Admission: EM | Admit: 2016-10-17 | Discharge: 2016-10-17 | Discharge status: Against Medical Advice | Payer: Self-pay | Attending: Emergency Medicine | Admitting: Emergency Medicine

## 2016-10-17 ENCOUNTER — Encounter (HOSPITAL_COMMUNITY): Payer: Self-pay | Admitting: Emergency Medicine

## 2016-10-17 DIAGNOSIS — F17210 Nicotine dependence, cigarettes, uncomplicated: Secondary | ICD-10-CM | POA: Insufficient documentation

## 2016-10-17 DIAGNOSIS — R110 Nausea: Secondary | ICD-10-CM | POA: Insufficient documentation

## 2016-10-17 DIAGNOSIS — I10 Essential (primary) hypertension: Secondary | ICD-10-CM | POA: Insufficient documentation

## 2016-10-17 DIAGNOSIS — Z79899 Other long term (current) drug therapy: Secondary | ICD-10-CM | POA: Insufficient documentation

## 2016-10-17 DIAGNOSIS — G252 Other specified forms of tremor: Secondary | ICD-10-CM | POA: Insufficient documentation

## 2016-10-17 DIAGNOSIS — Z5320 Procedure and treatment not carried out because of patient's decision for unspecified reasons: Secondary | ICD-10-CM | POA: Insufficient documentation

## 2016-10-17 MED ORDER — CHLORDIAZEPOXIDE HCL 25 MG PO CAPS: ORAL_CAPSULE | ORAL | 0 refills | Status: DC

## 2016-10-17 MED ORDER — LORAZEPAM 2 MG/ML IJ SOLN
2.0000 mg | Freq: Once | INTRAMUSCULAR | Status: AC
  Administered 2016-10-17: 2 mg via INTRAMUSCULAR
  Filled 2016-10-17: qty 1

## 2016-10-17 MED ORDER — ONDANSETRON 4 MG PO TBDP
4.0000 mg | ORAL_TABLET | Freq: Once | ORAL | Status: AC
  Administered 2016-10-17: 4 mg via ORAL
  Filled 2016-10-17: qty 1

## 2016-10-17 MED ORDER — CHLORDIAZEPOXIDE HCL 25 MG PO CAPS
100.0000 mg | ORAL_CAPSULE | Freq: Once | ORAL | Status: AC
  Administered 2016-10-17: 100 mg via ORAL
  Filled 2016-10-17: qty 4

## 2016-10-17 MED ORDER — LORAZEPAM 2 MG/ML IJ SOLN
2.0000 mg | Freq: Once | INTRAMUSCULAR | Status: DC
  Filled 2016-10-17: qty 1

## 2016-10-17 NOTE — ED Notes (Signed)
Patient resting quietly at this time. Updated patient on POC.

## 2016-10-17 NOTE — ED Notes (Signed)
Bed: [id number] Expected date:  Expected time:  Means of arrival:  Comments: EMS- 40yo M, DTs

## 2016-10-17 NOTE — ED Triage Notes (Signed)
Pt stated that he started shaking last night. Reports vomiting after breakfast this am.. Reports that he took a pill for shaking 1 hour ago.Pt is alert and cooperative

## 2016-10-17 NOTE — ED Provider Notes (Signed)
[facility]-EMERGENCY DEPT Provider Note   CSN: [id number] Arrival date & time: 10/17/16  1009     History   Chief Complaint No chief complaint on file.   HPI Leonard Guerrero is a 41 y.o. male.  41 yo M with a chief complaints of tremors and nausea. This been going on since yesterday. He is currently in rehabilitation for alcohol withdrawal. Denies seizures or hallucinations. Denies cough congestion fevers abdominal pain vomiting or diarrhea. The patient is anxious to get back and continue his therapy.  Notes from the rehabilitation facility were reviewed. He is given a dose of Ativan yesterday. Has a documented fever score today that they felt was concerning.   The history is provided by the patient.  Illness  This is a new problem. The current episode started yesterday. The problem occurs constantly. The problem has not changed since onset.Pertinent negatives include no chest pain, no abdominal pain, no headaches and no shortness of breath. Nothing aggravates the symptoms. Nothing relieves the symptoms. He has tried nothing for the symptoms. The treatment provided no relief.    Past Medical History:  Diagnosis Date  . Bipolar 1 disorder (HCC)   . ETOH abuse   . Hypertension   . Paranoid schizophrenia [facility])     Patient Active Problem List   Diagnosis Date Noted  . Elevated TSH 06/18/2016  . Schizophrenia (HCC) 06/17/2016  . Major depressive disorder with single episode 06/17/2016  . Alcohol use disorder, severe, dependence (HCC) 06/17/2016  . Cocaine use disorder, moderate, dependence (HCC) 06/17/2016  . Alcoholic intoxication without complication (HCC)   . Acute encephalopathy 06/08/2016  . Acute respiratory failure (HCC)   . Altered mental status   . Encephalopathy acute 06/07/2016  . Chest pain 04/04/2015    Past Surgical History:  Procedure Laterality Date  . DENTAL SURGERY     complete extraction       Home Medications    Prior to Admission medications     Medication Sig Start Date End Date Taking? Authorizing Provider  Paliperidone Palmitate 546 MG/1.75ML SUSP Inject 546 mg into the muscle every 3 (three) months. (Due 06-27-16): For mood control 06/27/16  Yes Armandina Stammer I, NP  traZODone (DESYREL) 50 MG tablet Take 1 tablet (50 mg total) by mouth at bedtime as needed for sleep. 06/19/16  Yes Armandina Stammer I, NP  acamprosate (CAMPRAL) 333 MG tablet Take 2 tablets (666 mg total) by mouth 3 (three) times daily with meals. For alcoholism 06/19/16   Armandina Stammer I, NP  amLODipine (NORVASC) 5 MG tablet Take 1 tablet (5 mg total) by mouth daily. 04/04/15   Laqueta Linden, MD  benztropine (COGENTIN) 1 MG tablet Take 1 mg by mouth every morning. & 1/2 tablet at bedtime    [provider]  benztropine (COGENTIN) 2 MG tablet Take 1 tablet (2 mg total) by mouth 2 (two) times daily. For prevention of drug induced tremors. 06/19/16   Armandina Stammer I, NP  chlordiazePOXIDE (LIBRIUM) 25 MG capsule  PO TID x 1D, then 25-50mg  PO BID X 1D, then 25-50mg  PO QD X 1D 10/17/16   Melene Plan, DO  escitalopram (LEXAPRO) 5 MG tablet Take 1 tablet (5 mg total) by mouth daily. For depression 06/20/16   Armandina Stammer I, NP  folic acid (FOLVITE) 1 MG tablet Take 1 tablet (1 mg total) by mouth daily. For low Folate replacement 06/19/16   Armandina Stammer I, NP  haloperidol (HALDOL) 0.5 MG tablet Take 0.5 mg by mouth  2 (two) times daily.    [provider]  hydrOXYzine (ATARAX/VISTARIL) 25 MG tablet Take 1 tablet (25 mg total) by mouth every 6 (six) hours as needed for anxiety (Sleep). 06/19/16   Armandina StammerNwoko, Agnes I, NP  lamoTRIgine (LAMICTAL) 25 MG tablet Take 25 mg by mouth 2 (two) times daily.    [provider]  nicotine polacrilex (NICORETTE) 2 MG gum Take 1 each (2 mg total) by mouth as needed for smoking cessation. 06/19/16   Armandina StammerNwoko, Agnes I, NP  Paliperidone Palmitate (INVEGA TRINZA IM) Inject into the muscle.    [provider]  QUEtiapine (SEROQUEL  XR) 400 MG 24 hr tablet Take 800 mg by mouth at bedtime.    [provider]  thiamine 100 MG tablet Take 1 tablet (100 mg total) by mouth daily. For low thiamine replacement 06/19/16   Sanjuana KavaNwoko, Agnes I, NP    Family History Family History  Problem Relation Age of Onset  . Mental illness Father   . Hypertension Mother   . Mental illness Sister     Social History Social History  Substance Use Topics  . Smoking status: Current Every Day Smoker    Packs/day: 1.00    Years: 10.00    Types: Cigarettes  . Smokeless tobacco: Never Used  . Alcohol use 1.8 - 2.4 oz/week    3 - 4 Cans of beer per week     Allergies   Patient has no known allergies.   Review of Systems Review of Systems  Constitutional: Negative for chills and fever.  HENT: Negative for congestion and facial swelling.   Eyes: Negative for discharge and visual disturbance.  Respiratory: Negative for shortness of breath.   Cardiovascular: Negative for chest pain and palpitations.  Gastrointestinal: Positive for nausea. Negative for abdominal pain, diarrhea and vomiting.  Musculoskeletal: Positive for myalgias. Negative for arthralgias.  Skin: Negative for color change and rash.  Neurological: Negative for tremors, syncope and headaches.  Psychiatric/Behavioral: Negative for confusion and dysphoric mood.     Physical Exam Updated Vital Signs BP 116/89   Pulse 79   Temp 97.7 F (36.5 C) (Oral)   Resp 18   SpO2 93%   Physical Exam  Constitutional: He is oriented to person, place, and time. He appears well-developed and well-nourished.  HENT:  Head: Normocephalic and atraumatic.  Eyes: Pupils are equal, round, and reactive to light. EOM are normal.  Neck: Normal range of motion. Neck supple. No JVD present.  Cardiovascular: Normal rate and regular rhythm.  Exam reveals no gallop and no friction rub.   No murmur heard. Pulmonary/Chest: No respiratory distress. He has no wheezes.  Abdominal: He  exhibits no distension and no mass. There is no tenderness. There is no rebound and no guarding.  Musculoskeletal: Normal range of motion.  Neurological: He is alert and oriented to person, place, and time.  Skin: No rash noted. No pallor.  Psychiatric: He has a normal mood and affect. His behavior is normal.  Nursing note and vitals reviewed.    ED Treatments / Results  Labs (all labs ordered are listed, but only abnormal results are displayed) Labs Reviewed - No data to display  EKG  EKG Interpretation None       Radiology No results found.  Procedures Procedures (including critical care time)  Medications Ordered in ED Medications  LORazepam (ATIVAN) injection 2 mg (not administered)  LORazepam (ATIVAN) injection 2 mg (2 mg Intramuscular Given 10/17/16 1113)  chlordiazePOXIDE (LIBRIUM) capsule  100 mg (100 mg Oral Given 10/17/16 1113)  ondansetron (ZOFRAN-ODT) disintegrating tablet 4 mg (4 mg Oral Given 10/17/16 1113)     Initial Impression / Assessment and Plan / ED Course  I have reviewed the triage vital signs and the nursing notes.  Pertinent labs & imaging results that were available during my care of the patient were reviewed by me and considered in my medical decision making (see chart for details).     41 yo M With a chief complaint of alcohol withdrawal. He is currently in a rehabilitation facility for the same. The patient is not tachycardic or hypertensive. I'm unsure of the etiology of the symptoms. This may be withdrawal from another substance. The patient denies any other drug use though was positive for cocaine. Clinically sounds like he could be having an opiate withdrawal. We'll give a dose of Ativan and Librium and reassess.  Patient feeling better requesting return to his facility.  D/c back  1:12 PM:  I have discussed the diagnosis/risks/treatment options with the patient and family and believe the pt to be eligible for discharge home to follow-up  with PCP. We also discussed returning to the ED immediately if new or worsening sx occur. We discussed the sx which are most concerning (e.g., sudden worsening pain, fever, inability to tolerate by mouth) that necessitate immediate return. Medications administered to the patient during their visit and any new prescriptions provided to the patient are listed below.  Medications given during this visit Medications  LORazepam (ATIVAN) injection 2 mg (not administered)  LORazepam (ATIVAN) injection 2 mg (2 mg Intramuscular Given 10/17/16 1113)  chlordiazePOXIDE (LIBRIUM) capsule 100 mg (100 mg Oral Given 10/17/16 1113)  ondansetron (ZOFRAN-ODT) disintegrating tablet 4 mg (4 mg Oral Given 10/17/16 1113)     The patient appears reasonably screen and/or stabilized for discharge and I doubt any other medical condition or other [facility] requiring further screening, evaluation, or treatment in the ED at this time prior to discharge.    Final Clinical Impressions(s) / ED Diagnoses   Final diagnoses:  Coarse tremors    New Prescriptions New Prescriptions   CHLORDIAZEPOXIDE (LIBRIUM) 25 MG CAPSULE     PO TID x 1D, then 25-50mg  PO BID X 1D, then 25-50mg  PO QD X 1D     Melene Plan, DO 10/17/16 1312

## 2016-10-17 NOTE — ED Notes (Addendum)
Patient left AMA. All clothing on floor in room. MD notified. [facility] called- nurse Janine notified.

## 2016-10-17 NOTE — ED Triage Notes (Signed)
Per EMS- [facility] called with concern about c/o pain and tremors from from alcohol withdrawal. Pt was given Ativan yesterday. Pt is AO x4 and c/o nausea. [facility]

## 2016-10-20 ENCOUNTER — Emergency Department (HOSPITAL_COMMUNITY)
Admission: EM | Admit: 2016-10-20 | Discharge: 2016-10-22 | Disposition: A | Discharge status: Other Acute Inpatient Hospital | Payer: Self-pay | Attending: Emergency Medicine | Admitting: Emergency Medicine

## 2016-10-20 ENCOUNTER — Encounter (HOSPITAL_COMMUNITY): Payer: Self-pay | Admitting: Emergency Medicine

## 2016-10-20 DIAGNOSIS — Y9389 Activity, other specified: Secondary | ICD-10-CM | POA: Insufficient documentation

## 2016-10-20 DIAGNOSIS — T1491XA Suicide attempt, initial encounter: Secondary | ICD-10-CM | POA: Insufficient documentation

## 2016-10-20 DIAGNOSIS — R45851 Suicidal ideations: Secondary | ICD-10-CM | POA: Insufficient documentation

## 2016-10-20 DIAGNOSIS — Z046 Encounter for general psychiatric examination, requested by authority: Secondary | ICD-10-CM | POA: Insufficient documentation

## 2016-10-20 DIAGNOSIS — S61512A Laceration without foreign body of left wrist, initial encounter: Secondary | ICD-10-CM | POA: Insufficient documentation

## 2016-10-20 DIAGNOSIS — F200 Paranoid schizophrenia: Secondary | ICD-10-CM | POA: Diagnosis present

## 2016-10-20 DIAGNOSIS — Y999 Unspecified external cause status: Secondary | ICD-10-CM | POA: Insufficient documentation

## 2016-10-20 DIAGNOSIS — Z9114 Patient's other noncompliance with medication regimen: Secondary | ICD-10-CM | POA: Insufficient documentation

## 2016-10-20 DIAGNOSIS — R45850 Homicidal ideations: Secondary | ICD-10-CM | POA: Insufficient documentation

## 2016-10-20 DIAGNOSIS — Z79899 Other long term (current) drug therapy: Secondary | ICD-10-CM | POA: Insufficient documentation

## 2016-10-20 DIAGNOSIS — X789XXA Intentional self-harm by unspecified sharp object, initial encounter: Secondary | ICD-10-CM | POA: Insufficient documentation

## 2016-10-20 DIAGNOSIS — I10 Essential (primary) hypertension: Secondary | ICD-10-CM | POA: Insufficient documentation

## 2016-10-20 DIAGNOSIS — R440 Auditory hallucinations: Secondary | ICD-10-CM | POA: Insufficient documentation

## 2016-10-20 DIAGNOSIS — F329 Major depressive disorder, single episode, unspecified: Secondary | ICD-10-CM | POA: Insufficient documentation

## 2016-10-20 DIAGNOSIS — Y929 Unspecified place or not applicable: Secondary | ICD-10-CM | POA: Insufficient documentation

## 2016-10-20 DIAGNOSIS — F17210 Nicotine dependence, cigarettes, uncomplicated: Secondary | ICD-10-CM | POA: Insufficient documentation

## 2016-10-20 DIAGNOSIS — F32A Depression, unspecified: Secondary | ICD-10-CM

## 2016-10-20 LAB — COMPREHENSIVE METABOLIC PANEL
ALBUMIN: 4.9 g/dL (ref 3.5–5.0)
ALT: 126 U/L — ABNORMAL HIGH (ref 17–63)
ANION GAP: 12 (ref 5–15)
AST: 187 U/L — ABNORMAL HIGH (ref 15–41)
Alkaline Phosphatase: 66 U/L (ref 38–126)
BILIRUBIN TOTAL: 1 mg/dL (ref 0.3–1.2)
BUN: 8 mg/dL (ref 6–20)
CO2: 23 mmol/L (ref 22–32)
Calcium: 9.4 mg/dL (ref 8.9–10.3)
Chloride: 100 mmol/L — ABNORMAL LOW (ref 101–111)
Creatinine, Ser: 0.92 mg/dL (ref 0.61–1.24)
GFR calc non Af Amer: >60 mL/min (ref >60)
GLUCOSE: 115 mg/dL — AB (ref 65–99)
Potassium: 3.8 mmol/L (ref 3.5–5.1)
Sodium: 135 mmol/L (ref 135–145)
TOTAL PROTEIN: 8.2 g/dL — AB (ref 6.5–8.1)

## 2016-10-20 LAB — SALICYLATE LEVEL

## 2016-10-20 LAB — CBC
HCT: 46.5 % (ref 39.0–52.0)
Hemoglobin: 16.5 g/dL (ref 13.0–17.0)
MCH: 31.9 pg (ref 26.0–34.0)
MCHC: 35.5 g/dL (ref 30.0–36.0)
MCV: 89.9 fL (ref 78.0–100.0)
PLATELETS: 132 10*3/uL — AB (ref 150–400)
RBC: 5.17 MIL/uL (ref 4.22–5.81)
RDW: 14.2 % (ref 11.5–15.5)
WBC: 10.2 10*3/uL (ref 4.0–10.5)

## 2016-10-20 LAB — RAPID URINE DRUG SCREEN, HOSP PERFORMED
Amphetamines: NOT DETECTED
BENZODIAZEPINES: POSITIVE — AB
Barbiturates: NOT DETECTED
COCAINE: NOT DETECTED
Opiates: NOT DETECTED
Tetrahydrocannabinol: NOT DETECTED

## 2016-10-20 LAB — ETHANOL: Alcohol, Ethyl (B): 6 mg/dL — ABNORMAL HIGH (ref <5)

## 2016-10-20 LAB — ACETAMINOPHEN LEVEL

## 2016-10-20 MED ORDER — HYDROXYZINE HCL 25 MG PO TABS
25.0000 mg | ORAL_TABLET | Freq: Four times a day (QID) | ORAL | Status: DC | PRN
  Administered 2016-10-20: 25 mg via ORAL
  Filled 2016-10-20: qty 1

## 2016-10-20 MED ORDER — VITAMIN B-1 100 MG PO TABS
100.0000 mg | ORAL_TABLET | Freq: Every day | ORAL | Status: DC
Start: 2016-10-20 — End: 2016-10-22
  Administered 2016-10-20 – 2016-10-22 (×3): 100 mg via ORAL
  Filled 2016-10-20 (×3): qty 1

## 2016-10-20 MED ORDER — CEPHALEXIN 500 MG PO CAPS
500.0000 mg | ORAL_CAPSULE | Freq: Three times a day (TID) | ORAL | Status: DC
  Administered 2016-10-20 – 2016-10-22 (×7): 500 mg via ORAL
  Filled 2016-10-20 (×7): qty 1

## 2016-10-20 MED ORDER — BENZTROPINE MESYLATE 1 MG PO TABS
2.0000 mg | ORAL_TABLET | Freq: Two times a day (BID) | ORAL | Status: DC
  Administered 2016-10-20 – 2016-10-21 (×2): 2 mg via ORAL
  Filled 2016-10-20 (×2): qty 2

## 2016-10-20 MED ORDER — FOLIC ACID 1 MG PO TABS
1.0000 mg | ORAL_TABLET | Freq: Every day | ORAL | Status: DC
  Administered 2016-10-20 – 2016-10-22 (×3): 1 mg via ORAL
  Filled 2016-10-20 (×3): qty 1

## 2016-10-20 MED ORDER — TRAZODONE HCL 50 MG PO TABS
50.0000 mg | ORAL_TABLET | Freq: Every evening | ORAL | Status: DC | PRN
  Administered 2016-10-20: 50 mg via ORAL
  Filled 2016-10-20: qty 1

## 2016-10-20 MED ORDER — LORAZEPAM 1 MG PO TABS
2.0000 mg | ORAL_TABLET | Freq: Once | ORAL | Status: AC
  Administered 2016-10-20: 2 mg via ORAL
  Filled 2016-10-20: qty 2

## 2016-10-20 MED ORDER — ESCITALOPRAM OXALATE 10 MG PO TABS
5.0000 mg | ORAL_TABLET | Freq: Every day | ORAL | Status: DC
Start: 2016-10-20 — End: 2016-10-21
  Administered 2016-10-20 – 2016-10-21 (×2): 5 mg via ORAL
  Filled 2016-10-20 (×2): qty 1

## 2016-10-20 MED ORDER — ACAMPROSATE CALCIUM 333 MG PO TBEC
666.0000 mg | DELAYED_RELEASE_TABLET | Freq: Three times a day (TID) | ORAL | Status: DC
Start: 2016-10-21 — End: 2016-10-22
  Administered 2016-10-21 – 2016-10-22 (×4): 666 mg via ORAL
  Filled 2016-10-20 (×7): qty 2

## 2016-10-20 NOTE — BH Assessment (Addendum)
Assessment Note  Leonard Guerrero is an 41 y.o. male who was brought to [facility] ED by the police in IVC after cutting his wrist in an apparent suicide attempt.  Patient states that he was hearing voices telling him to kill himself.  Patient states that he was last hospitalized at [facility] in May after overdosing in a suicide attempt.  He states that he was also abusing alcohol at that time and he was detoxed.  Upon his discharge from [facility] he states that he went to live with his daughter and grandchildren in [place] and states that he was able to stay sober for three months.  However, he did not transfer his mental health services to [place] and states that he has been off his medications since May of this year.  He states that he was going for services at [facility] and they were prescribing him Invega injections IM q 3 months.  Patient states that he stayed with his family as long as he could.   Patient indicates that when he went to [place] that his daughter was supposed to help him find his own place, but he was forced to live with her and she was taking his disability check.  Patient states that he got frustrated with this situation that he left [place] to return to [place].  He states that he he resumed his drinking approximately two weeks ago and states that he has been drinking six to seven forty ounce beers daily.  Patient states that he also returned to being homeless once he left [place] and he is currently living in the streets.  Patient states that he went to [facility] earlier this week in order to re-start his mental health services, but he states that they sent him to the ED here at [facility].  He states that he was seen by TTS and the FNP/Psychiatrist and was given some medication and states that he was told, "you will be alright now, you can go and follow-up with [facility]."  Patient states that he has continued to be depressed and states that he has been feeling hopeless and helpless.  He states that  for the past couple days that he has been hearing voices to hurt himself and states that he cut his wrists in order to die.  He states that he has been hearing voices to hurt others, but has no identified victim, no intent or plan and states that he has no history of causing harm to others.  Patient states that he is unable to contract for safety outside of a facility and he has no resources or support locally.  He also appears to be tremulous, has an elevated BP and appears to be in withdrawal from alcohol.  TTS contacted Elta Guadeloupe, FNP to staff case for disposition. Patient will need to be held overnight for an AM Psych Consult.   Diagnosis: Major Depressive Disorder Recurrent Severe with Psychotic Features and Alcohol Use Disorder Severe.  Past Medical History:  Past Medical History:  Diagnosis Date  . Bipolar 1 disorder (HCC)   . ETOH abuse   . Hypertension   . Paranoid schizophrenia [facility])     Past Surgical History:  Procedure Laterality Date  . DENTAL SURGERY     complete extraction    Family History:  Family History  Problem Relation Age of Onset  . Mental illness Father   . Hypertension Mother   . Mental illness Sister     Social History:  reports that  he has been smoking Cigarettes.  He has a 10.00 pack-year smoking history. He has never used smokeless tobacco. He reports that he drinks about 1.8 - 2.4 oz of alcohol per week . His drug history is not on file.  Additional Social History:     CIWA: CIWA-Ar BP: (!) 149/90 Pulse Rate: 91 COWS:    Allergies: No Known Allergies  Home Medications:  (Not in a hospital admission)  OB/GYN Status:  No LMP for male patient.  General Assessment Data Location of Assessment: [facility] ED TTS Assessment: In system Is this a Tele or Face-to-Face Assessment?: Face-to-Face Is this an Initial Assessment or a Re-assessment for this encounter?: Initial Assessment Marital status: Divorced [place] name:  (NA) Is patient pregnant?:   (NA) Pregnancy Status:  (NA) Living Arrangements: Alone, Other (Comment) (homeless) Can pt return to current living arrangement?: Yes (Homeless) Admission Status: Involuntary Is patient capable of signing voluntary admission?: Yes Referral Source: MD Insurance type:  (None reported, most likely re-reapplying for medicaid)     Crisis Care Plan Living Arrangements: Alone, Other (Comment) (homeless) Legal Guardian: Other: (none) Name of Psychiatrist:  (pt states that he does not know, goes to [place]) Name of Therapist:  (Pt does not know her name- she is at [facility])  Education Status Is patient currently in school?: No Current Grade:  (N/A) Highest grade of school patient has completed:  (9th) Name of school:  (not assessed) Contact person:  (N/A)  Risk to self with the past 6 months Suicidal Ideation: Yes-Currently Present Has patient been a risk to self within the past 6 months prior to admission? : Yes (OD in May with hospitalization) Suicidal Intent: Yes-Currently Present Has patient had any suicidal intent within the past 6 months prior to admission? : Yes Is patient at risk for suicide?: Yes Suicidal Plan?: Yes-Currently Present (cut wrists) Has patient had any suicidal plan within the past 6 months prior to admission? : Yes Specify Current Suicidal Plan:  (cut wrists) Access to Means: Yes Specify Access to Suicidal Means:  (razor) What has been your use of drugs/alcohol within the last 12 months?:  (relapsed 2 weeks ago drinking 6-7 forties daily) Previous Attempts/Gestures: Yes How many times?:  (multiple) Other Self Harm Risks:  (voices to kill self) Triggers for Past Attempts: Family contact (alcohol and homelessness) Intentional Self Injurious Behavior: Cutting Comment - Self Injurious Behavior:  (hx of suicide attempts) Family Suicide History: No Recent stressful life event(s): Divorce, Surveyor, quantity Problems, Other (Comment) (homeless) Persecutory voices/beliefs?:  No Depression: Yes Depression Symptoms: Isolating, Loss of interest in usual pleasures, Feeling worthless/self pity Substance abuse history and/or treatment for substance abuse?: Yes Suicide prevention information given to non-admitted patients: Not applicable  Risk to Others within the past 6 months Homicidal Ideation: Yes-Currently Present Does patient have any lifetime risk of violence toward others beyond the six months prior to admission? : No Thoughts of Harm to Others: Yes-Currently Present Comment - Thoughts of Harm to Others:  (voices to hurt others) Current Homicidal Intent: No Current Homicidal Plan: No Access to Homicidal Means: No Identified Victim:  (none) History of harm to others?: No Assessment of Violence: On admission Violent Behavior Description:  (none reported) Does patient have access to weapons?: No Criminal Charges Pending?: No Does patient have a court date: No Is patient on probation?: No  Psychosis Hallucinations: Auditory, Visual (voices to hurt self and others) Delusions: None noted  Mental Status Report Appearance/Hygiene: Disheveled, Poor hygiene Eye Contact: Good Motor Activity: Restlessness,  Tremors Speech: Logical/coherent Level of Consciousness: Alert Mood: Depressed, Anxious Anxiety Level: Moderate Thought Processes: Coherent Judgement: Impaired Orientation: Person, Place, Time, Situation Obsessive Compulsive Thoughts/Behaviors: None  Cognitive Functioning Concentration: Decreased Memory: Recent Intact, Remote Intact IQ: Average Insight: Poor Impulse Control: Poor Appetite: Fair Weight Loss:  (not assessed) Weight Gain:  (not assessed) Sleep: Decreased Total Hours of Sleep:  (6) Vegetative Symptoms: Decreased grooming  ADLScreening [facility] Assessment Services) Patient's cognitive ability adequate to safely complete daily activities?: Yes Patient able to express need for assistance with ADLs?: Yes Independently performs ADLs?:  Yes (appropriate for developmental age)  Prior Inpatient Therapy Prior Inpatient Therapy: Yes Prior Therapy Dates:  (last hospitalization at [facility] in May 2018) Prior Therapy Facilty/Provider(s):  [facility]) Reason for Treatment:  (depression, alcohol and suicidal)  Prior Outpatient Therapy Prior Outpatient Therapy: Yes (goes to [place]) Prior Therapy Dates:  (just re-started services there, was in [place] for 3 months) Prior Therapy Facilty/Provider(s):  [facility]) Reason for Treatment:  (depression/psychosis) Does patient have an ACCT team?: No Does patient have Intensive In-House Services?  : No Does patient have [facility] services? : No Does patient have P4CC services?:  (none)  ADL Screening (condition at time of admission) Patient's cognitive ability adequate to safely complete daily activities?: Yes Is the patient deaf or have difficulty hearing?: No Does the patient have difficulty seeing, even when wearing glasses/contacts?: No Does the patient have difficulty concentrating, remembering, or making decisions?: No Patient able to express need for assistance with ADLs?: Yes Does the patient have difficulty dressing or bathing?: No Independently performs ADLs?: Yes (appropriate for developmental age) Does the patient have difficulty walking or climbing stairs?: No Weakness of Legs: None Weakness of Arms/Hands: None       Abuse/Neglect Assessment (Assessment to be complete while patient is alone) Physical Abuse: Denies Verbal Abuse: Denies Sexual Abuse: Denies Exploitation of patient/patient's resources: Denies Self-Neglect: Denies Values / Beliefs Cultural Requests During Hospitalization: None Spiritual Requests During Hospitalization: None Consults Spiritual Care Consult Needed: No Social Work Consult Needed: No Merchant navy officer (For Healthcare) Does Patient Have a Medical Advance Directive?: No    Additional Information 1:1 In Past 12 Months?: No CIRT Risk:  No Elopement Risk: No Does patient have medical clearance?: Yes     Disposition: Hold for AM Psych Eval. Disposition Initial Assessment Completed for this Encounter: Yes Disposition of Patient: Inpatient treatment program Type of inpatient treatment program: Adult (Hold for AM Psych Eval.)  On Site Evaluation by:   Reviewed with Physician:    Arnoldo Lenis Iyanna Drummer 10/20/2016 6:38 PM    Contacted Dr. Effie Shy with final disposition at 7:18 pm

## 2016-10-20 NOTE — ED Notes (Signed)
TTS at bedside. 

## 2016-10-20 NOTE — ED Notes (Signed)
Bed: WA28 Expected date:  Expected time:  Means of arrival:  Comments: 

## 2016-10-20 NOTE — BH Assessment (Addendum)
[facility] Assessment Progress Note     TTS contacted Leonard Guadeloupe, FNP to staff case for disposition. Patient will need to be held overnight for an AM Psych Consult.

## 2016-10-20 NOTE — ED Notes (Signed)
Patient wound on left wrist irrigated and dressing applied.

## 2016-10-20 NOTE — ED Notes (Signed)
Bed: WA27 Expected date:  Expected time:  Means of arrival:  Comments: 

## 2016-10-20 NOTE — ED Provider Notes (Signed)
5:39 PM-labs reviewed, no abnormalities.   Clinical Course as of Oct 21 1750  Sun Oct 20, 2016  1739 Medical clearance evaluation-labs reviewed he is medically cleared, for treatment by psychiatry.  [EW]    Clinical Course User Index [EW] Mancel Bale, MD     Mancel Bale, MD 10/20/16 [phone]

## 2016-10-20 NOTE — ED Notes (Signed)
Bed: [facility] Expected date:  Expected time:  Means of arrival:  Comments: 

## 2016-10-20 NOTE — ED Triage Notes (Addendum)
Per [employer], patient reports he is hearing voices telling him to kill someone.   Patient also reports voices telling him to kill himself. Reports using knife to cut him self on left wrist last night. Gauze wrapped around wrist at triage.

## 2016-10-20 NOTE — ED Provider Notes (Signed)
[facility] DEPT Provider Note   CSN: [id number] Arrival date & time: 10/20/16  1355     History   Chief Complaint Chief Complaint  Patient presents with  . Suicidal  . Homicidal    HPI Leonard Guerrero is a 41 y.o. male.  HPI Patient brought in by police. Reportedly last night slit his wrist because of voices are telling him to voices told him to hurt himself and to kill other people. States he refused care last night states he's been using alcohol but denies other drug use. History of paranoid schizophrenia. Has been off his medications for a while. Drink somewhat heavily but is somewhat difficult to quantify. States he drinks "larger" beers. No numbness or weakness in his left hand. Past Medical History:  Diagnosis Date  . Bipolar 1 disorder (HCC)   . ETOH abuse   . Hypertension   . Paranoid schizophrenia [facility])     Patient Active Problem List   Diagnosis Date Noted  . Elevated TSH 06/18/2016  . Schizophrenia (HCC) 06/17/2016  . Major depressive disorder with single episode 06/17/2016  . Alcohol use disorder, severe, dependence (HCC) 06/17/2016  . Cocaine use disorder, moderate, dependence (HCC) 06/17/2016  . Alcoholic intoxication without complication (HCC)   . Acute encephalopathy 06/08/2016  . Acute respiratory failure (HCC)   . Altered mental status   . Encephalopathy acute 06/07/2016  . Chest pain 04/04/2015    Past Surgical History:  Procedure Laterality Date  . DENTAL SURGERY     complete extraction       Home Medications    Prior to Admission medications   Medication Sig Start Date End Date Taking? Authorizing Provider  acamprosate (CAMPRAL) 333 MG tablet Take 2 tablets (666 mg total) by mouth 3 (three) times daily with meals. For alcoholism Patient not taking: Reported on 10/20/2016 06/19/16   Armandina Stammer I, NP  amLODipine (NORVASC) 5 MG tablet Take 1 tablet (5 mg total) by mouth daily. Patient not taking: Reported on 10/20/2016 04/04/15    Laqueta Linden, MD  benztropine (COGENTIN) 1 MG tablet Take 1 mg by mouth every morning. & 1/2 tablet at bedtime    [provider]  benztropine (COGENTIN) 2 MG tablet Take 1 tablet (2 mg total) by mouth 2 (two) times daily. For prevention of drug induced tremors. Patient not taking: Reported on 10/20/2016 06/19/16   Armandina Stammer I, NP  chlordiazePOXIDE (LIBRIUM) 25 MG capsule  PO TID x 1D, then 25-50mg  PO BID X 1D, then 25-50mg  PO QD X 1D Patient not taking: Reported on 10/20/2016 10/17/16   Melene Plan, DO  escitalopram (LEXAPRO) 5 MG tablet Take 1 tablet (5 mg total) by mouth daily. For depression Patient not taking: Reported on 10/20/2016 06/20/16   Armandina Stammer I, NP  folic acid (FOLVITE) 1 MG tablet Take 1 tablet (1 mg total) by mouth daily. For low Folate replacement Patient not taking: Reported on 10/20/2016 06/19/16   Armandina Stammer I, NP  haloperidol (HALDOL) 0.5 MG tablet Take 0.5 mg by mouth 2 (two) times daily.    [provider]  hydrOXYzine (ATARAX/VISTARIL) 25 MG tablet Take 1 tablet (25 mg total) by mouth every 6 (six) hours as needed for anxiety (Sleep). Patient not taking: Reported on 10/20/2016 06/19/16   Armandina Stammer I, NP  lamoTRIgine (LAMICTAL) 25 MG tablet Take 25 mg by mouth 2 (two) times daily.    [provider]  nicotine polacrilex (NICORETTE) 2 MG gum Take 1 each (2 mg  total) by mouth as needed for smoking cessation. Patient not taking: Reported on 10/20/2016 06/19/16   Armandina Stammer I, NP  Paliperidone Palmitate (INVEGA TRINZA IM) Inject into the muscle.    [provider]  Paliperidone Palmitate 546 MG/1.75ML SUSP Inject 546 mg into the muscle every 3 (three) months. (Due 06-27-16): For mood control Patient not taking: Reported on 10/20/2016 06/27/16   Armandina Stammer I, NP  QUEtiapine (SEROQUEL XR) 400 MG 24 hr tablet Take 800 mg by mouth at bedtime.    [provider]  thiamine 100 MG tablet Take 1 tablet (100 mg total) by mouth  daily. For low thiamine replacement Patient not taking: Reported on 10/20/2016 06/19/16   Armandina Stammer I, NP  traZODone (DESYREL) 50 MG tablet Take 1 tablet (50 mg total) by mouth at bedtime as needed for sleep. Patient not taking: Reported on 10/20/2016 06/19/16   Sanjuana Kava, NP    Family History Family History  Problem Relation Age of Onset  . Mental illness Father   . Hypertension Mother   . Mental illness Sister     Social History Social History  Substance Use Topics  . Smoking status: Current Every Day Smoker    Packs/day: 1.00    Years: 10.00    Types: Cigarettes  . Smokeless tobacco: Never Used  . Alcohol use 1.8 - 2.4 oz/week    3 - 4 Cans of beer per week     Allergies   Patient has no known allergies.   Review of Systems Review of Systems  Constitutional: Negative for appetite change.  HENT: Negative for congestion.   Respiratory: Negative for shortness of breath.   Cardiovascular: Negative for chest pain.  Gastrointestinal: Negative for abdominal pain.  Musculoskeletal: Negative for back pain.  Skin: Positive for wound.  Neurological: Negative for seizures.  Hematological: Negative for adenopathy.  Psychiatric/Behavioral: Positive for dysphoric mood, hallucinations and suicidal ideas.     Physical Exam Updated Vital Signs BP (!) 149/90 (BP Location: Left Arm)   Pulse 91   Temp 98.3 F (36.8 C) (Oral)   Resp 18   SpO2 94%   Physical Exam  Constitutional: He is oriented to person, place, and time. He appears well-developed.  HENT:  Head: Atraumatic.  Eyes: Pupils are equal, round, and reactive to light.  Neck: Neck supple.  Cardiovascular: Normal rate.   Pulmonary/Chest: Effort normal.  Abdominal: Soft.  Musculoskeletal:  Multiple superficial lacerations along left wrist there is however one more deep approximate 1.5 cm laceration. Tendons visualize the laceration but appear to be intact and the tendons were pulled to the range of motion. Good  flexion-extension of the wrist MCP PIP and DIP joints. Sensation intact over radial median and ulnar distributions hand.  Neurological: He is alert and oriented to person, place, and time.  Skin: Skin is warm.  Psychiatric:  Patient has somewhat pressured speech     ED Treatments / Results  Labs (all labs ordered are listed, but only abnormal results are displayed) Labs Reviewed  COMPREHENSIVE METABOLIC PANEL  ETHANOL  SALICYLATE LEVEL  ACETAMINOPHEN LEVEL  CBC  RAPID URINE DRUG SCREEN, HOSP PERFORMED    EKG  EKG Interpretation None       Radiology No results found.  Procedures Procedures (including critical care time)  Medications Ordered in ED Medications  LORazepam (ATIVAN) tablet 2 mg (not administered)     Initial Impression / Assessment and Plan / ED Course  I have reviewed the triage vital  signs and the nursing notes.  Pertinent labs & imaging results that were available during my care of the patient were reviewed by me and considered in my medical decision making (see chart for details).     Patient with wrist laceration in a suicide attempt. Tendons visualize but appear grossly intact. Will give oral antibiotics. 7 irrigated and will be dressed but with the laceration being yesterday I think this delayed closure may be more risky. Also with suicidal and homicidal thoughts. Auditory hallucinations. Has been off medications. Will require treatment. Labs were pending but will likely be medically cleared. Also has history of alcohol abuse. Will require Civa scoring and symptomatic treatment  Final Clinical Impressions(s) / ED Diagnoses   Final diagnoses:  Depression, unspecified depression type  Suicidal ideations  Homicidal ideations  Auditory hallucination  Wrist laceration, left, initial encounter    New Prescriptions New Prescriptions   No medications on file     Benjiman Core, MD 10/20/16 1443

## 2016-10-21 DIAGNOSIS — X789XXA Intentional self-harm by unspecified sharp object, initial encounter: Secondary | ICD-10-CM

## 2016-10-21 DIAGNOSIS — R45850 Homicidal ideations: Secondary | ICD-10-CM

## 2016-10-21 DIAGNOSIS — F22 Delusional disorders: Secondary | ICD-10-CM

## 2016-10-21 DIAGNOSIS — F419 Anxiety disorder, unspecified: Secondary | ICD-10-CM

## 2016-10-21 DIAGNOSIS — G4700 Insomnia, unspecified: Secondary | ICD-10-CM

## 2016-10-21 DIAGNOSIS — R4587 Impulsiveness: Secondary | ICD-10-CM

## 2016-10-21 DIAGNOSIS — T1491XA Suicide attempt, initial encounter: Secondary | ICD-10-CM

## 2016-10-21 DIAGNOSIS — R450 Nervousness: Secondary | ICD-10-CM

## 2016-10-21 DIAGNOSIS — Z818 Family history of other mental and behavioral disorders: Secondary | ICD-10-CM

## 2016-10-21 DIAGNOSIS — F17210 Nicotine dependence, cigarettes, uncomplicated: Secondary | ICD-10-CM

## 2016-10-21 DIAGNOSIS — F200 Paranoid schizophrenia: Secondary | ICD-10-CM

## 2016-10-21 DIAGNOSIS — R443 Hallucinations, unspecified: Secondary | ICD-10-CM

## 2016-10-21 MED ORDER — TRIHEXYPHENIDYL HCL 5 MG PO TABS
5.0000 mg | ORAL_TABLET | Freq: Three times a day (TID) | ORAL | Status: DC
  Administered 2016-10-21 – 2016-10-22 (×4): 5 mg via ORAL
  Filled 2016-10-21 (×4): qty 1

## 2016-10-21 MED ORDER — HYDROXYZINE HCL 25 MG PO TABS
25.0000 mg | ORAL_TABLET | Freq: Two times a day (BID) | ORAL | Status: DC
Start: 2016-10-21 — End: 2016-10-22
  Administered 2016-10-21 – 2016-10-22 (×2): 25 mg via ORAL
  Filled 2016-10-21 (×2): qty 1

## 2016-10-21 MED ORDER — NICOTINE 21 MG/24HR TD PT24
21.0000 mg | MEDICATED_PATCH | Freq: Once | TRANSDERMAL | Status: DC
  Administered 2016-10-21: 21 mg via TRANSDERMAL
  Filled 2016-10-21: qty 1

## 2016-10-21 MED ORDER — TRAZODONE HCL 100 MG PO TABS
100.0000 mg | ORAL_TABLET | Freq: Every day | ORAL | Status: DC
  Administered 2016-10-21: 100 mg via ORAL
  Filled 2016-10-21: qty 1

## 2016-10-21 MED ORDER — ZOLPIDEM TARTRATE 10 MG PO TABS
10.0000 mg | ORAL_TABLET | Freq: Every evening | ORAL | Status: DC | PRN
  Administered 2016-10-21: 10 mg via ORAL
  Filled 2016-10-21: qty 1

## 2016-10-21 MED ORDER — PALIPERIDONE ER 6 MG PO TB24
6.0000 mg | ORAL_TABLET | Freq: Every day | ORAL | Status: DC
  Administered 2016-10-21 – 2016-10-22 (×2): 6 mg via ORAL
  Filled 2016-10-21 (×3): qty 1

## 2016-10-21 MED ORDER — GABAPENTIN 100 MG PO CAPS
200.0000 mg | ORAL_CAPSULE | Freq: Two times a day (BID) | ORAL | Status: DC
  Administered 2016-10-21 – 2016-10-22 (×3): 200 mg via ORAL
  Filled 2016-10-21 (×2): qty 2

## 2016-10-21 NOTE — BH Assessment (Addendum)
Per Dr. Jannifer Franklin and Elta Guadeloupe, NP, patient meets criteria for INPT treatment. TTS to seek placement. AC Inetta Fermo) at [facility] notified of bed needs. Writer also referred patient to the following facilities: [facility], [place], [place], [address], [place], [place], [address], [address], [place], [place], [place], and [place].  Informed that [facility] is interested in patient, pending IVC. The IVC was initiated by Dr. Jannifer Franklin. First Examination and Petition/Affidavit were faxed to the day time magistrate. Writer confirmed receipt of documentation with Magistrate [place] @ 1246.

## 2016-10-21 NOTE — Consult Note (Signed)
[facility] Consult   Reason for Consult: suicide attempt Referring Physician:  EDP Patient Identification: Leonard Guerrero MRN:  [id number] Principal Diagnosis: Paranoid schizophrenia [facility]) Diagnosis:   Patient Active Problem List   Diagnosis Date Noted  . Paranoid schizophrenia ([place]) [F20.0] 06/17/2016    Priority: High  . Elevated TSH [R94.6] 06/18/2016  . Major depressive disorder with single episode [F32.9] 06/17/2016  . Alcohol use disorder, severe, dependence ([place]) [F10.20] 06/17/2016  . Cocaine use disorder, moderate, dependence ([place]) [F14.20] 06/17/2016  . Alcoholic intoxication without complication ([place]) [I50.277]   . Acute encephalopathy [G93.40] 06/08/2016  . Acute respiratory failure ([place]) [J96.00]   . Altered mental status [R41.82]   . Encephalopathy acute [G93.40] 06/07/2016  . Chest pain [R07.9] 04/04/2015    Total Time spent with patient: 45 minutes  Subjective:   Leonard Guerrero is a 41 y.o. male patient admitted after he attempted suicide by cutting.  HPI:  Patient who reports history of Paranoid Schizophrenia and Depression. He was brought to the ED by [employer] after he attempted suicide by cutting his left wrist. Patient reports that he has been hearing voices telling him to  hurt himself and to kill other people since he stopped taking [place] while he was away in [place]. Patient also endorses paranoia and has been self medicating with alcohol but has not consumed alcohol in a week. He wants to get back on Invega for stabilization. He denies illicit drug use.  Past Psychiatric History: as above  Risk to Self: Suicidal Ideation: Yes-Currently Present Suicidal Intent: Yes-Currently Present Is patient at risk for suicide?: Yes Suicidal Plan?: Yes-Currently Present (cut wrists) Specify Current Suicidal Plan:  (cut wrists) Access to Means: Yes Specify Access to Suicidal Means:  (razor) What has been your use of drugs/alcohol within the last 12 months?:   (relapsed 2 weeks ago drinking 6-7 forties daily) How many times?:  (multiple) Other Self Harm Risks:  (voices to kill self) Triggers for Past Attempts: Family contact (alcohol and homelessness) Intentional Self Injurious Behavior: Cutting Comment - Self Injurious Behavior:  (hx of suicide attempts) Risk to Others: Homicidal Ideation: Yes-Currently Present Thoughts of Harm to Others: Yes-Currently Present Comment - Thoughts of Harm to Others:  (voices to hurt others) Current Homicidal Intent: No Current Homicidal Plan: No Access to Homicidal Means: No Identified Victim:  (none) History of harm to others?: No Assessment of Violence: On admission Violent Behavior Description:  (none reported) Does patient have access to weapons?: No Criminal Charges Pending?: No Does patient have a court date: No Prior Inpatient Therapy: Prior Inpatient Therapy: Yes Prior Therapy Dates:  ([facility] in May 2018) Prior Therapy Facilty/Provider(s):  [facility]) Reason for Treatment:  (depression, alcohol and suicidal) Prior Outpatient Therapy: Prior Outpatient Therapy: Yes (goes to [place]) Prior Therapy Dates:  (just re-started services there, was in [place] for 3 months) Prior Therapy Facilty/Provider(s):  [facility]) Reason for Treatment:  (depression/psychosis) Does patient have an ACCT team?: No Does patient have Intensive In-House Services?  : No Does patient have [facility] services? : No Does patient have P4CC services?:  (none)  Past Medical History:  Past Medical History:  Diagnosis Date  . Bipolar 1 disorder ([place])   . ETOH abuse   . Hypertension   . Paranoid schizophrenia [facility])     Past Surgical History:  Procedure Laterality Date  . DENTAL SURGERY     complete extraction   Family History:  Family History  Problem Relation Age of Onset  .  Mental illness Father   . Hypertension Mother   . Mental illness Sister    Family Psychiatric  History:  Social History:  History   Alcohol Use  . 1.8 - 2.4 oz/week  . 3 - 4 Cans of beer per week     History  Drug use: Unknown    Social History   Social History  . Marital status: Unknown    Spouse name: N/A  . Number of children: N/A  . Years of education: N/A   Social History Main Topics  . Smoking status: Current Every Day Smoker    Packs/day: 1.00    Years: 10.00    Types: Cigarettes  . Smokeless tobacco: Never Used  . Alcohol use 1.8 - 2.4 oz/week    3 - 4 Cans of beer per week  . Drug use: Unknown  . Sexual activity: Not Asked   Other Topics Concern  . None   Social History Narrative   ** Merged History Encounter **       Additional Social History:    Allergies:  No Known Allergies  Labs:  Results for orders placed or performed during the hospital encounter of 10/20/16 (from the past 48 hour(s))  Comprehensive metabolic panel     Status: Abnormal   Collection Time: 10/20/16  3:10 PM  Result Value Ref Range   Sodium 135 135 - 145 mmol/L   Potassium 3.8 3.5 - 5.1 mmol/L   Chloride 100 (L) 101 - 111 mmol/L   CO2 23 22 - 32 mmol/L   Glucose, Bld 115 (H) 65 - 99 mg/dL   BUN 8 6 - 20 mg/dL   Creatinine, Ser 0.92 0.61 - 1.24 mg/dL   Calcium 9.4 8.9 - 10.3 mg/dL   Total Protein 8.2 (H) 6.5 - 8.1 g/dL   Albumin 4.9 3.5 - 5.0 g/dL   AST 187 (H) 15 - 41 U/L   ALT 126 (H) 17 - 63 U/L   Alkaline Phosphatase 66 38 - 126 U/L   Total Bilirubin 1.0 0.3 - 1.2 mg/dL   GFR calc non Af Amer >60 >60 mL/min   GFR calc Af Amer >60 >60 mL/min    Comment: (NOTE) The eGFR has been calculated using the CKD EPI equation. This calculation has not been validated in all clinical situations. eGFR's persistently <60 mL/min signify possible Chronic Kidney Disease.    Anion gap 12 5 - 15  cbc     Status: Abnormal   Collection Time: 10/20/16  3:10 PM  Result Value Ref Range   WBC 10.2 4.0 - 10.5 K/uL   RBC 5.17 4.22 - 5.81 MIL/uL   Hemoglobin 16.5 13.0 - 17.0 g/dL   HCT 46.5 39.0 - 52.0 %   MCV 89.9  78.0 - 100.0 fL   MCH 31.9 26.0 - 34.0 pg   MCHC 35.5 30.0 - 36.0 g/dL   RDW 14.2 11.5 - 15.5 %   Platelets 132 (L) 150 - 400 K/uL  Ethanol     Status: Abnormal   Collection Time: 10/20/16  3:11 PM  Result Value Ref Range   Alcohol, Ethyl (B) 6 (H) <5 mg/dL    Comment:        LOWEST DETECTABLE LIMIT FOR SERUM ALCOHOL IS 5 mg/dL FOR MEDICAL PURPOSES ONLY   Salicylate level     Status: None   Collection Time: 10/20/16  3:11 PM  Result Value Ref Range   Salicylate Lvl <1.6 2.8 - 30.0 mg/dL  Acetaminophen level  Status: Abnormal   Collection Time: 10/20/16  3:11 PM  Result Value Ref Range   Acetaminophen (Tylenol), Serum <10 (L) 10 - 30 ug/mL    Comment:        THERAPEUTIC CONCENTRATIONS VARY SIGNIFICANTLY. A RANGE OF 10-30 ug/mL MAY BE AN EFFECTIVE CONCENTRATION FOR MANY PATIENTS. HOWEVER, SOME ARE BEST TREATED AT CONCENTRATIONS OUTSIDE THIS RANGE. ACETAMINOPHEN CONCENTRATIONS >150 ug/mL AT 4 HOURS AFTER INGESTION AND >50 ug/mL AT 12 HOURS AFTER INGESTION ARE OFTEN ASSOCIATED WITH TOXIC REACTIONS.   Rapid urine drug screen (hospital performed)     Status: Abnormal   Collection Time: 10/20/16  5:16 PM  Result Value Ref Range   Opiates NONE DETECTED NONE DETECTED   Cocaine NONE DETECTED NONE DETECTED   Benzodiazepines POSITIVE (A) NONE DETECTED   Amphetamines NONE DETECTED NONE DETECTED   Tetrahydrocannabinol NONE DETECTED NONE DETECTED   Barbiturates NONE DETECTED NONE DETECTED    Comment:        DRUG SCREEN FOR MEDICAL PURPOSES ONLY.  IF CONFIRMATION IS NEEDED FOR ANY PURPOSE, NOTIFY LAB WITHIN 5 DAYS.        LOWEST DETECTABLE LIMITS FOR URINE DRUG SCREEN Drug Class       Cutoff (ng/mL) Amphetamine      1000 Barbiturate      200 Benzodiazepine   024 Tricyclics       097 Opiates          300 Cocaine          300 THC              50     Current Facility-Administered Medications  Medication Dose Route Frequency Provider Last Rate Last Dose  .  acamprosate (CAMPRAL) tablet 666 mg  666 mg Oral TID WC Daleen Bo, MD      . cephALEXin [facility]) capsule 500 mg  500 mg Oral Q8H Davonna Belling, MD   500 mg at 10/21/16 3532  . folic acid (FOLVITE) tablet 1 mg  1 mg Oral Daily Daleen Bo, MD   1 mg at 10/21/16 1033  . gabapentin (NEURONTIN) capsule 200 mg  200 mg Oral BID Mavin Dyke, MD      . hydrOXYzine (ATARAX/VISTARIL) tablet 25 mg  25 mg Oral BID Ashlei Chinchilla, MD      . paliperidone (INVEGA) 24 hr tablet 6 mg  6 mg Oral Daily Takayla Baillie, MD      . thiamine (VITAMIN B-1) tablet 100 mg  100 mg Oral Daily Daleen Bo, MD   100 mg at 10/21/16 1033  . traZODone (DESYREL) tablet 100 mg  100 mg Oral QHS Sheryn Aldaz, MD      . trihexyphenidyl (ARTANE) tablet 5 mg  5 mg Oral TID WC Jonea Bukowski, MD       Current Outpatient Prescriptions  Medication Sig Dispense Refill  . acamprosate (CAMPRAL) 333 MG tablet Take 2 tablets (666 mg total) by mouth 3 (three) times daily with meals. For alcoholism (Patient not taking: Reported on 10/20/2016) 180 tablet 0  . amLODipine (NORVASC) 5 MG tablet Take 1 tablet (5 mg total) by mouth daily. (Patient not taking: Reported on 10/20/2016) 90 tablet 1  . benztropine (COGENTIN) 1 MG tablet Take 1 mg by mouth every morning. & 1/2 tablet at bedtime    . benztropine (COGENTIN) 2 MG tablet Take 1 tablet (2 mg total) by mouth 2 (two) times daily. For prevention of drug induced tremors. (Patient not taking: Reported on 10/20/2016) 60 tablet 0  .  chlordiazePOXIDE (LIBRIUM) 25 MG capsule 19m PO TID x 1D, then 25-565mPO BID X 1D, then 25-5084mO QD X 1D (Patient not taking: Reported on 10/20/2016) 10 capsule 0  . escitalopram (LEXAPRO) 5 MG tablet Take 1 tablet (5 mg total) by mouth daily. For depression (Patient not taking: Reported on 10/20/2016) 30 tablet 0  . folic acid (FOLVITE) 1 MG tablet Take 1 tablet (1 mg total) by mouth daily. For low Folate replacement (Patient not taking: Reported  on 10/20/2016) 10 tablet 0  . haloperidol (HALDOL) 0.5 MG tablet Take 0.5 mg by mouth 2 (two) times daily.    . hydrOXYzine (ATARAX/VISTARIL) 25 MG tablet Take 1 tablet (25 mg total) by mouth every 6 (six) hours as needed for anxiety (Sleep). (Patient not taking: Reported on 10/20/2016) 60 tablet 0  . lamoTRIgine (LAMICTAL) 25 MG tablet Take 25 mg by mouth 2 (two) times daily.    . nicotine polacrilex (NICORETTE) 2 MG gum Take 1 each (2 mg total) by mouth as needed for smoking cessation. (Patient not taking: Reported on 10/20/2016) 100 tablet 0  . Paliperidone Palmitate (INVEGA TRINZA IM) Inject into the muscle.    . Paliperidone Palmitate 546 MG/1.75ML SUSP Inject 546 mg into the muscle every 3 (three) months. (Due 06-27-16): For mood control (Patient not taking: Reported on 10/20/2016) 1.75 mL 0  . QUEtiapine (SEROQUEL XR) 400 MG 24 hr tablet Take 800 mg by mouth at bedtime.    . thiamine 100 MG tablet Take 1 tablet (100 mg total) by mouth daily. For low thiamine replacement (Patient not taking: Reported on 10/20/2016) 10 tablet 0  . traZODone (DESYREL) 50 MG tablet Take 1 tablet (50 mg total) by mouth at bedtime as needed for sleep. (Patient not taking: Reported on 10/20/2016) 30 tablet 0    Musculoskeletal: Strength & Muscle Tone: within normal limits Gait & Station: normal Patient leans: N/A  Psychiatric Specialty Exam: Physical Exam  Psychiatric: His mood appears anxious. His speech is delayed and tangential. He is agitated and actively hallucinating. Thought content is paranoid and delusional. Cognition and memory are normal. He expresses impulsivity. He expresses suicidal ideation. He expresses suicidal plans.    Review of Systems  Constitutional: Negative.   HENT: Negative.   Eyes: Negative.   Respiratory: Negative.   Cardiovascular: Negative.   Gastrointestinal: Negative.   Genitourinary: Negative.   Musculoskeletal: Negative.   Skin: Negative.   Neurological: Positive for tremors.   Endo/Heme/Allergies: Negative.   Psychiatric/Behavioral: Positive for depression, hallucinations and suicidal ideas. The patient is nervous/anxious and has insomnia.     Blood pressure (!) 117/91, pulse 97, temperature 98.1 F (36.7 C), temperature source Oral, resp. rate 19, height 6' (1.829 m), weight 81.6 kg (180 lb), SpO2 93 %.Body mass index is 24.41 kg/m.  General Appearance: Disheveled  Eye Contact:  Fair  Speech:  Clear and Coherent  Volume:  Decreased  Mood:  Anxious, Depressed and Hopeless  Affect:  Constricted  Thought Process:  Disorganized  Orientation:  Full (Time, Place, and Person)  Thought Content:  Hallucinations: Auditory and Paranoid Ideation  Suicidal Thoughts:  Yes.  with intent/plan  Homicidal Thoughts:  Yes.  without intent/plan  Memory:  Immediate;   Fair Recent;   Fair Remote;   Fair  Judgement:  Poor  Insight:  Shallow  Psychomotor Activity:  Restlessness  Concentration:  Concentration: Fair and Attention Span: Fair  Recall:  [employer] Knowledge:  Fair  Language:  Good  Akathisia:  No  Handed:  Right  AIMS (if indicated):     Assets:  Communication Skills Desire for Improvement  ADL's:  Intact  Cognition:  WNL  Sleep:   poor     Treatment Plan Summary: Daily contact with patient to assess and evaluate symptoms and progress in treatment and Medication management  Start Gabapentin 200 mg bid for agitation/alcohol, Invega 76m daily for schizophrenia, Trazodone 100 mg qhs for sleep and Artane 58mtid for EPS/Hand tremors  Disposition: Recommend psychiatric Inpatient admission when medically cleared.  AkCorena PilgrimMD 10/21/2016 11:09 AM

## 2016-10-21 NOTE — BH Assessment (Signed)
Patient was noted to be brought in by [facility] with IVC papers in place. Writer was unable to locate the noted IVC papers. Dr. Jannifer Franklin was informed that although patient is noted to be under IVC his papers are unable to be found. Dr. Jannifer Franklin will assess patient to determine if IVC will need to re-done.

## 2016-10-22 ENCOUNTER — Inpatient Hospital Stay (HOSPITAL_COMMUNITY)
Admission: AD | Admit: 2016-10-22 | Discharge: 2016-10-29 | DRG: 885 | Disposition: A | Discharge status: Home / Self Care | Payer: Federal, State, Local not specified - Other | Source: Intra-hospital | Attending: Psychiatry | Admitting: Psychiatry

## 2016-10-22 ENCOUNTER — Encounter (HOSPITAL_COMMUNITY): Payer: Self-pay | Admitting: *Deleted

## 2016-10-22 DIAGNOSIS — I10 Essential (primary) hypertension: Secondary | ICD-10-CM | POA: Diagnosis present

## 2016-10-22 DIAGNOSIS — K08109 Complete loss of teeth, unspecified cause, unspecified class: Secondary | ICD-10-CM | POA: Diagnosis present

## 2016-10-22 DIAGNOSIS — F1420 Cocaine dependence, uncomplicated: Secondary | ICD-10-CM | POA: Diagnosis present

## 2016-10-22 DIAGNOSIS — F17210 Nicotine dependence, cigarettes, uncomplicated: Secondary | ICD-10-CM | POA: Diagnosis present

## 2016-10-22 DIAGNOSIS — F10230 Alcohol dependence with withdrawal, uncomplicated: Secondary | ICD-10-CM | POA: Diagnosis present

## 2016-10-22 DIAGNOSIS — F1020 Alcohol dependence, uncomplicated: Secondary | ICD-10-CM | POA: Diagnosis present

## 2016-10-22 DIAGNOSIS — F332 Major depressive disorder, recurrent severe without psychotic features: Secondary | ICD-10-CM | POA: Diagnosis present

## 2016-10-22 DIAGNOSIS — F209 Schizophrenia, unspecified: Secondary | ICD-10-CM | POA: Diagnosis present

## 2016-10-22 DIAGNOSIS — R443 Hallucinations, unspecified: Secondary | ICD-10-CM | POA: Diagnosis not present

## 2016-10-22 DIAGNOSIS — X789XXD Intentional self-harm by unspecified sharp object, subsequent encounter: Secondary | ICD-10-CM | POA: Diagnosis present

## 2016-10-22 DIAGNOSIS — F333 Major depressive disorder, recurrent, severe with psychotic symptoms: Secondary | ICD-10-CM | POA: Diagnosis present

## 2016-10-22 DIAGNOSIS — Z818 Family history of other mental and behavioral disorders: Secondary | ICD-10-CM

## 2016-10-22 DIAGNOSIS — Z590 Homelessness: Secondary | ICD-10-CM | POA: Diagnosis not present

## 2016-10-22 DIAGNOSIS — Z9114 Patient's other noncompliance with medication regimen: Secondary | ICD-10-CM | POA: Diagnosis not present

## 2016-10-22 DIAGNOSIS — G4700 Insomnia, unspecified: Secondary | ICD-10-CM | POA: Diagnosis present

## 2016-10-22 DIAGNOSIS — Z8249 Family history of ischemic heart disease and other diseases of the circulatory system: Secondary | ICD-10-CM | POA: Diagnosis not present

## 2016-10-22 DIAGNOSIS — F200 Paranoid schizophrenia: Secondary | ICD-10-CM | POA: Diagnosis present

## 2016-10-22 MED ORDER — ACAMPROSATE CALCIUM 333 MG PO TBEC
666.0000 mg | DELAYED_RELEASE_TABLET | Freq: Three times a day (TID) | ORAL | Status: DC
  Administered 2016-10-22 – 2016-10-23 (×3): 666 mg via ORAL
  Filled 2016-10-22 (×7): qty 2

## 2016-10-22 MED ORDER — ACETAMINOPHEN 325 MG PO TABS: 650.0000 mg | ORAL_TABLET | Freq: Four times a day (QID) | ORAL | Status: DC | PRN

## 2016-10-22 MED ORDER — HYDROXYZINE HCL 25 MG PO TABS
25.0000 mg | ORAL_TABLET | Freq: Four times a day (QID) | ORAL | Status: DC | PRN
  Filled 2016-10-22: qty 10

## 2016-10-22 MED ORDER — TRAZODONE HCL 100 MG PO TABS
100.0000 mg | ORAL_TABLET | Freq: Every day | ORAL | Status: DC
  Administered 2016-10-22: 100 mg via ORAL
  Filled 2016-10-22 (×3): qty 1

## 2016-10-22 MED ORDER — GABAPENTIN 100 MG PO CAPS
200.0000 mg | ORAL_CAPSULE | Freq: Two times a day (BID) | ORAL | Status: DC
  Administered 2016-10-22 – 2016-10-29 (×14): 200 mg via ORAL
  Filled 2016-10-22: qty 2
  Filled 2016-10-22: qty 28
  Filled 2016-10-22 (×14): qty 2
  Filled 2016-10-22: qty 28
  Filled 2016-10-22 (×2): qty 2

## 2016-10-22 MED ORDER — MAGNESIUM HYDROXIDE 400 MG/5ML PO SUSP: 30.0000 mL | Freq: Every day | ORAL | Status: DC | PRN

## 2016-10-22 MED ORDER — PALIPERIDONE ER 6 MG PO TB24
6.0000 mg | ORAL_TABLET | Freq: Every day | ORAL | Status: DC
  Administered 2016-10-23 – 2016-10-29 (×7): 6 mg via ORAL
  Filled 2016-10-22 (×3): qty 1
  Filled 2016-10-22: qty 7
  Filled 2016-10-22 (×5): qty 1

## 2016-10-22 MED ORDER — CEPHALEXIN 500 MG PO CAPS
500.0000 mg | ORAL_CAPSULE | Freq: Three times a day (TID) | ORAL | Status: AC
  Administered 2016-10-22 – 2016-10-27 (×15): 500 mg via ORAL
  Filled 2016-10-22: qty 2
  Filled 2016-10-22 (×10): qty 1
  Filled 2016-10-22: qty 2
  Filled 2016-10-22 (×5): qty 1

## 2016-10-22 MED ORDER — ALUM & MAG HYDROXIDE-SIMETH 200-200-20 MG/5ML PO SUSP: 30.0000 mL | ORAL | Status: DC | PRN

## 2016-10-22 MED ORDER — BACITRACIN ZINC 500 UNIT/GM EX OINT
TOPICAL_OINTMENT | CUTANEOUS | Status: AC
  Filled 2016-10-22: qty 0.9

## 2016-10-22 MED ORDER — TRIHEXYPHENIDYL HCL 5 MG PO TABS
5.0000 mg | ORAL_TABLET | Freq: Three times a day (TID) | ORAL | Status: DC
  Administered 2016-10-22 – 2016-10-29 (×20): 5 mg via ORAL
  Filled 2016-10-22 (×28): qty 1

## 2016-10-22 NOTE — Tx Team (Signed)
Admission Note:  41 year old male who presents voluntary, in no acute distress, for the treatment of SI and Substance Abuse. Patient appears flat, depressed, and anxious. Patient was cooperative with admission process. Patient reports that he is being admitted to [facility] because "I tried to cut my wrist". Patient identified act as a suicide attempt.  Patient currently denies SI and contracts for safety upon admission. Patient reports Auditory Hallucinations.  Patient reports previous suicide attempt and previous admission at [facility] in May 2018.  Patient reports that he recently moved back to [place] from [place], where he was staying with his daughter and her family.  Patient is currently living in a camper.  Patient identifies his mother as his support system and states that she is the one who takes patient to his appointments.  Patient is on disability.  Patient reports cocaine use and drinking "6-7 40 oz" per day.  Patient reports that he currently is not experiencing any withdrawal symptoms.  Patient reports that he had been off of his Haldol for "3 months" and recently got put back on.  Patient states "I needed my Haldol and the doctor took me off of it".  While at [facility], patient would like to "get home" and "Not drink".  Skin was assessed.  Patient had a fresh scar to right lower leg and cuts to left forearm.  Patient searched and no contraband found, POC and unit policies explained and understanding verbalized. Consents obtained. Patient had no additional questions or concerns.

## 2016-10-22 NOTE — Consult Note (Signed)
[facility] Psych ED Progress Note  10/22/2016 11:05 AM Leonard Guerrero  MRN:  [id number] Subjective: ''I am still hearing voices and feeling suicidal.'' Objective: Patient was seen, interviewed, chart reviewed and his case discussed with treatment team. Patient reports ongoing suicidal thoughts, anxiety, depression , paranoia and hearing voices telling him to  hurt himself.  Principal Problem: Paranoid schizophrenia ([place]) Diagnosis:   Patient Active Problem List   Diagnosis Date Noted  . Paranoid schizophrenia ([place]) [F20.0] 06/17/2016    Priority: High  . Elevated TSH [R94.6] 06/18/2016  . Major depressive disorder with single episode [F32.9] 06/17/2016  . Alcohol use disorder, severe, dependence ([place]) [F10.20] 06/17/2016  . Cocaine use disorder, moderate, dependence ([place]) [F14.20] 06/17/2016  . Alcoholic intoxication without complication ([place]) [Y65.993]   . Acute encephalopathy [G93.40] 06/08/2016  . Acute respiratory failure ([place]) [J96.00]   . Altered mental status [R41.82]   . Encephalopathy acute [G93.40] 06/07/2016  . Chest pain [R07.9] 04/04/2015   Total Time spent with patient: 45 minutes  Past Psychiatric History: as above  Past Medical History:  Past Medical History:  Diagnosis Date  . Bipolar 1 disorder ([place])   . ETOH abuse   . Hypertension   . Paranoid schizophrenia [facility])     Past Surgical History:  Procedure Laterality Date  . DENTAL SURGERY     complete extraction   Family History:  Family History  Problem Relation Age of Onset  . Mental illness Father   . Hypertension Mother   . Mental illness Sister    Family Psychiatric  History:  Social History:  History  Alcohol Use  . 1.8 - 2.4 oz/week  . 3 - 4 Cans of beer per week     History  Drug use: Unknown    Social History   Social History  . Marital status: Unknown    Spouse name: N/A  . Number of children: N/A  . Years of education: N/A   Social History Main Topics  . Smoking status: Current Every  Day Smoker    Packs/day: 1.00    Years: 10.00    Types: Cigarettes  . Smokeless tobacco: Never Used  . Alcohol use 1.8 - 2.4 oz/week    3 - 4 Cans of beer per week  . Drug use: Unknown  . Sexual activity: Not Asked   Other Topics Concern  . None   Social History Narrative   ** Merged History Encounter **        Sleep: Fair  Appetite:  Fair  Current Medications: Current Facility-Administered Medications  Medication Dose Route Frequency Provider Last Rate Last Dose  . acamprosate (CAMPRAL) tablet 666 mg  666 mg Oral TID WC Daleen Bo, MD   666 mg at 10/22/16 0843  . cephALEXin (KEFLEX) capsule 500 mg  500 mg Oral Q8H Davonna Belling, MD   500 mg at 10/22/16 5701  . folic acid (FOLVITE) tablet 1 mg  1 mg Oral Daily Daleen Bo, MD   1 mg at 10/21/16 1033  . gabapentin (NEURONTIN) capsule 200 mg  200 mg Oral BID Darleene Cleaver, Mahaila Tischer, MD   200 mg at 10/21/16 2100  . hydrOXYzine (ATARAX/VISTARIL) tablet 25 mg  25 mg Oral BID Corena Pilgrim, MD   25 mg at 10/21/16 2101  . nicotine (NICODERM CQ - dosed in mg/24 hours) patch 21 mg  21 mg Transdermal Once Lacretia Leigh, MD   21 mg at 10/21/16 1715  . paliperidone (INVEGA) 24 hr tablet 6 mg  6 mg  Oral Daily Youssouf Shipley, MD   6 mg at 10/21/16 1300  . thiamine (VITAMIN B-1) tablet 100 mg  100 mg Oral Daily Daleen Bo, MD   100 mg at 10/21/16 1033  . traZODone (DESYREL) tablet 100 mg  100 mg Oral QHS Gearldean Lomanto, MD   100 mg at 10/21/16 2100  . trihexyphenidyl (ARTANE) tablet 5 mg  5 mg Oral TID WC Kitty Cadavid, MD   5 mg at 10/22/16 0843  . zolpidem (AMBIEN) tablet 10 mg  10 mg Oral QHS PRN Blanchie Dessert, MD   10 mg at 10/21/16 2350   Current Outpatient Prescriptions  Medication Sig Dispense Refill  . acamprosate (CAMPRAL) 333 MG tablet Take 2 tablets (666 mg total) by mouth 3 (three) times daily with meals. For alcoholism (Patient not taking: Reported on 10/20/2016) 180 tablet 0  . amLODipine (NORVASC) 5 MG  tablet Take 1 tablet (5 mg total) by mouth daily. (Patient not taking: Reported on 10/20/2016) 90 tablet 1  . benztropine (COGENTIN) 1 MG tablet Take 1 mg by mouth every morning. & 1/2 tablet at bedtime    . benztropine (COGENTIN) 2 MG tablet Take 1 tablet (2 mg total) by mouth 2 (two) times daily. For prevention of drug induced tremors. (Patient not taking: Reported on 10/20/2016) 60 tablet 0  . chlordiazePOXIDE (LIBRIUM) 25 MG capsule 54m PO TID x 1D, then 25-542mPO BID X 1D, then 25-5070mO QD X 1D (Patient not taking: Reported on 10/20/2016) 10 capsule 0  . escitalopram (LEXAPRO) 5 MG tablet Take 1 tablet (5 mg total) by mouth daily. For depression (Patient not taking: Reported on 10/20/2016) 30 tablet 0  . folic acid (FOLVITE) 1 MG tablet Take 1 tablet (1 mg total) by mouth daily. For low Folate replacement (Patient not taking: Reported on 10/20/2016) 10 tablet 0  . haloperidol (HALDOL) 0.5 MG tablet Take 0.5 mg by mouth 2 (two) times daily.    . hydrOXYzine (ATARAX/VISTARIL) 25 MG tablet Take 1 tablet (25 mg total) by mouth every 6 (six) hours as needed for anxiety (Sleep). (Patient not taking: Reported on 10/20/2016) 60 tablet 0  . lamoTRIgine (LAMICTAL) 25 MG tablet Take 25 mg by mouth 2 (two) times daily.    . nicotine polacrilex (NICORETTE) 2 MG gum Take 1 each (2 mg total) by mouth as needed for smoking cessation. (Patient not taking: Reported on 10/20/2016) 100 tablet 0  . Paliperidone Palmitate (INVEGA TRINZA IM) Inject into the muscle.    . Paliperidone Palmitate 546 MG/1.75ML SUSP Inject 546 mg into the muscle every 3 (three) months. (Due 06-27-16): For mood control (Patient not taking: Reported on 10/20/2016) 1.75 mL 0  . QUEtiapine (SEROQUEL XR) 400 MG 24 hr tablet Take 800 mg by mouth at bedtime.    . thiamine 100 MG tablet Take 1 tablet (100 mg total) by mouth daily. For low thiamine replacement (Patient not taking: Reported on 10/20/2016) 10 tablet 0  . traZODone (DESYREL) 50 MG tablet  Take 1 tablet (50 mg total) by mouth at bedtime as needed for sleep. (Patient not taking: Reported on 10/20/2016) 30 tablet 0    Lab Results:  Results for orders placed or performed during the hospital encounter of 10/20/16 (from the past 48 hour(s))  Comprehensive metabolic panel     Status: Abnormal   Collection Time: 10/20/16  3:10 PM  Result Value Ref Range   Sodium 135 135 - 145 mmol/L   Potassium 3.8 3.5 - 5.1 mmol/L  Chloride 100 (L) 101 - 111 mmol/L   CO2 23 22 - 32 mmol/L   Glucose, Bld 115 (H) 65 - 99 mg/dL   BUN 8 6 - 20 mg/dL   Creatinine, Ser 0.92 0.61 - 1.24 mg/dL   Calcium 9.4 8.9 - 10.3 mg/dL   Total Protein 8.2 (H) 6.5 - 8.1 g/dL   Albumin 4.9 3.5 - 5.0 g/dL   AST 187 (H) 15 - 41 U/L   ALT 126 (H) 17 - 63 U/L   Alkaline Phosphatase 66 38 - 126 U/L   Total Bilirubin 1.0 0.3 - 1.2 mg/dL   GFR calc non Af Amer >60 >60 mL/min   GFR calc Af Amer >60 >60 mL/min    Comment: (NOTE) The eGFR has been calculated using the CKD EPI equation. This calculation has not been validated in all clinical situations. eGFR's persistently <60 mL/min signify possible Chronic Kidney Disease.    Anion gap 12 5 - 15  cbc     Status: Abnormal   Collection Time: 10/20/16  3:10 PM  Result Value Ref Range   WBC 10.2 4.0 - 10.5 K/uL   RBC 5.17 4.22 - 5.81 MIL/uL   Hemoglobin 16.5 13.0 - 17.0 g/dL   HCT 46.5 39.0 - 52.0 %   MCV 89.9 78.0 - 100.0 fL   MCH 31.9 26.0 - 34.0 pg   MCHC 35.5 30.0 - 36.0 g/dL   RDW 14.2 11.5 - 15.5 %   Platelets 132 (L) 150 - 400 K/uL  Ethanol     Status: Abnormal   Collection Time: 10/20/16  3:11 PM  Result Value Ref Range   Alcohol, Ethyl (B) 6 (H) <5 mg/dL    Comment:        LOWEST DETECTABLE LIMIT FOR SERUM ALCOHOL IS 5 mg/dL FOR MEDICAL PURPOSES ONLY   Salicylate level     Status: None   Collection Time: 10/20/16  3:11 PM  Result Value Ref Range   Salicylate Lvl <0.3 2.8 - 30.0 mg/dL  Acetaminophen level     Status: Abnormal   Collection Time:  10/20/16  3:11 PM  Result Value Ref Range   Acetaminophen (Tylenol), Serum <10 (L) 10 - 30 ug/mL    Comment:        THERAPEUTIC CONCENTRATIONS VARY SIGNIFICANTLY. A RANGE OF 10-30 ug/mL MAY BE AN EFFECTIVE CONCENTRATION FOR MANY PATIENTS. HOWEVER, SOME ARE BEST TREATED AT CONCENTRATIONS OUTSIDE THIS RANGE. ACETAMINOPHEN CONCENTRATIONS >150 ug/mL AT 4 HOURS AFTER INGESTION AND >50 ug/mL AT 12 HOURS AFTER INGESTION ARE OFTEN ASSOCIATED WITH TOXIC REACTIONS.   Rapid urine drug screen (hospital performed)     Status: Abnormal   Collection Time: 10/20/16  5:16 PM  Result Value Ref Range   Opiates NONE DETECTED NONE DETECTED   Cocaine NONE DETECTED NONE DETECTED   Benzodiazepines POSITIVE (A) NONE DETECTED   Amphetamines NONE DETECTED NONE DETECTED   Tetrahydrocannabinol NONE DETECTED NONE DETECTED   Barbiturates NONE DETECTED NONE DETECTED    Comment:        DRUG SCREEN FOR MEDICAL PURPOSES ONLY.  IF CONFIRMATION IS NEEDED FOR ANY PURPOSE, NOTIFY LAB WITHIN 5 DAYS.        LOWEST DETECTABLE LIMITS FOR URINE DRUG SCREEN Drug Class       Cutoff (ng/mL) Amphetamine      1000 Barbiturate      200 Benzodiazepine   474 Tricyclics       259 Opiates  300 Cocaine          300 THC              50     Blood Alcohol level:  Lab Results  Component Value Date   ETH 6 (H) 10/20/2016   ETH 16 (H) 06/07/2016    Physical Findings: AIMS:  , ,  ,  ,    CIWA:    COWS:     Musculoskeletal: Strength & Muscle Tone: within normal limits Gait & Station: normal Patient leans: N/A  Psychiatric Specialty Exam: Physical Exam  Psychiatric: His mood appears anxious. His speech is delayed and tangential. He is withdrawn and actively hallucinating. Thought content is paranoid and delusional. Cognition and memory are normal. He expresses impulsivity. He exhibits a depressed mood. He expresses homicidal ideation.    Review of Systems  Constitutional: Negative.   HENT: Negative.    Eyes: Negative.   Respiratory: Negative.   Cardiovascular: Negative.   Gastrointestinal: Negative.   Genitourinary: Negative.   Musculoskeletal: Negative.   Skin: Negative.   Neurological: Positive for tremors.  Endo/Heme/Allergies: Negative.   Psychiatric/Behavioral: Positive for depression, hallucinations, substance abuse and suicidal ideas. The patient is nervous/anxious and has insomnia.     Blood pressure 120/82, pulse 72, temperature 97.6 F (36.4 C), temperature source Oral, resp. rate 18, height 6' (1.829 m), weight 81.6 kg (180 lb), SpO2 96 %.Body mass index is 24.41 kg/m.  General Appearance: Casual  Eye Contact:  Good  Speech:  Clear and Coherent  Volume:  Normal  Mood:  Depressed, Dysphoric and Hopeless  Affect:  Constricted  Thought Process:  Coherent  Orientation:  Full (Time, Place, and Person)  Thought Content:  Hallucinations: Auditory and Paranoid Ideation  Suicidal Thoughts:  Yes.  without intent/plan  Homicidal Thoughts:  No  Memory:  Immediate;   Fair Recent;   Fair Remote;   Fair  Judgement:  Poor  Insight:  Shallow  Psychomotor Activity:  Restlessness  Concentration:  Concentration: Fair and Attention Span: Fair  Recall:  [employer] of Knowledge:  Fair  Language:  Good  Akathisia:  No  Handed:  Right  AIMS (if indicated):     Assets:  Communication Skills Desire for Improvement  ADL's:  Intact  Cognition:  WNL  Sleep:   fair      Treatment Plan Summary: Daily contact with patient to assess and evaluate symptoms and progress in treatment and Medication management  Continue  Gabapentin 200 mg bid for agitation/alcohol, Invega 46m daily for schizophrenia, Trazodone 100 mg qhs for sleep and Artane 533mtid for EPS/Hand tremors  Disposition: Recommend psychiatric Inpatient admission when medically cleared.   AkCorena PilgrimMD 10/22/2016, 11:05 AM

## 2016-10-22 NOTE — Tx Team (Signed)
Initial Treatment Plan 10/22/2016 4:07 PM Helton Oleson ZOX:[id number]    PATIENT STRESSORS: Medication change or noncompliance Substance abuse   PATIENT STRENGTHS: Communication skills Motivation for treatment/growth Supportive family/friends   PATIENT IDENTIFIED PROBLEMS: Substance Abuse  At risk for suicide  "Get home"  "Not drink"               DISCHARGE CRITERIA:  Ability to meet basic life and health needs Improved stabilization in mood, thinking, and/or behavior Motivation to continue treatment in a less acute level of care Need for constant or close observation no longer present Verbal commitment to aftercare and medication compliance Withdrawal symptoms are absent or subacute and managed without 24-hour nursing intervention  PRELIMINARY DISCHARGE PLAN: Attend 12-step recovery group Outpatient therapy Return to previous living arrangement  PATIENT/FAMILY INVOLVEMENT: This treatment plan has been presented to and reviewed with the patient, Leonard Guerrero.  The patient and family have been given the opportunity to ask questions and make suggestions.  Carleene Overlie, RN 10/22/2016, 4:07 PM

## 2016-10-22 NOTE — Progress Notes (Signed)
Pt did not attend wrap-up group   

## 2016-10-22 NOTE — Progress Notes (Signed)
  D: When asked about his day pt stated, "long as I get my medicine and take my medicine, I'm ok". Writer explained that he "left in March, went to [place] and came back on the 1st". Stated that after getting back he went to [place]. Stated, "I don't hear or see things if taking my medicines". Pt explained that May 28th he had his Invega inj, but wasn't able to get it in Aug. Pt admitted to [facility] and A/H. Stated, "I talk to the voices all the time, but when I don't get my medicine it's like they're arguing in my head". Pt has no questions or concerns.    A:  Support and encouragement was offered. 15 min checks continued for safety.  R: Pt remains safe.

## 2016-10-23 ENCOUNTER — Encounter (HOSPITAL_COMMUNITY): Payer: Self-pay | Admitting: Psychiatry

## 2016-10-23 DIAGNOSIS — Z818 Family history of other mental and behavioral disorders: Secondary | ICD-10-CM

## 2016-10-23 DIAGNOSIS — F1420 Cocaine dependence, uncomplicated: Secondary | ICD-10-CM

## 2016-10-23 DIAGNOSIS — F200 Paranoid schizophrenia: Secondary | ICD-10-CM

## 2016-10-23 DIAGNOSIS — T1491XA Suicide attempt, initial encounter: Secondary | ICD-10-CM

## 2016-10-23 DIAGNOSIS — X789XXA Intentional self-harm by unspecified sharp object, initial encounter: Secondary | ICD-10-CM

## 2016-10-23 DIAGNOSIS — F1020 Alcohol dependence, uncomplicated: Secondary | ICD-10-CM

## 2016-10-23 DIAGNOSIS — F333 Major depressive disorder, recurrent, severe with psychotic symptoms: Secondary | ICD-10-CM | POA: Diagnosis present

## 2016-10-23 DIAGNOSIS — Z590 Homelessness: Secondary | ICD-10-CM

## 2016-10-23 DIAGNOSIS — F209 Schizophrenia, unspecified: Secondary | ICD-10-CM | POA: Diagnosis present

## 2016-10-23 DIAGNOSIS — S61519A Laceration without foreign body of unspecified wrist, initial encounter: Secondary | ICD-10-CM

## 2016-10-23 DIAGNOSIS — Z9114 Patient's other noncompliance with medication regimen: Secondary | ICD-10-CM

## 2016-10-23 DIAGNOSIS — R450 Nervousness: Secondary | ICD-10-CM

## 2016-10-23 DIAGNOSIS — R440 Auditory hallucinations: Secondary | ICD-10-CM

## 2016-10-23 DIAGNOSIS — F17210 Nicotine dependence, cigarettes, uncomplicated: Secondary | ICD-10-CM

## 2016-10-23 DIAGNOSIS — F1910 Other psychoactive substance abuse, uncomplicated: Secondary | ICD-10-CM

## 2016-10-23 DIAGNOSIS — F419 Anxiety disorder, unspecified: Secondary | ICD-10-CM

## 2016-10-23 MED ORDER — TRAZODONE HCL 150 MG PO TABS
150.0000 mg | ORAL_TABLET | Freq: Every evening | ORAL | Status: DC | PRN
  Administered 2016-10-23: 150 mg via ORAL
  Filled 2016-10-23 (×6): qty 1

## 2016-10-23 MED ORDER — ESCITALOPRAM OXALATE 10 MG PO TABS
10.0000 mg | ORAL_TABLET | Freq: Every day | ORAL | Status: DC
  Administered 2016-10-23 – 2016-10-26 (×4): 10 mg via ORAL
  Filled 2016-10-23 (×7): qty 1

## 2016-10-23 MED ORDER — PALIPERIDONE PALMITATE 156 MG/ML IM SUSP
156.0000 mg | Freq: Once | INTRAMUSCULAR | Status: AC
  Administered 2016-10-28: 156 mg via INTRAMUSCULAR
  Filled 2016-10-23: qty 1

## 2016-10-23 MED ORDER — NICOTINE 21 MG/24HR TD PT24
21.0000 mg | MEDICATED_PATCH | Freq: Every day | TRANSDERMAL | Status: DC
  Administered 2016-10-23 – 2016-10-28 (×6): 21 mg via TRANSDERMAL
  Filled 2016-10-23 (×9): qty 1

## 2016-10-23 MED ORDER — TRAZODONE HCL 150 MG PO TABS
150.0000 mg | ORAL_TABLET | Freq: Every day | ORAL | Status: DC
  Administered 2016-10-23: 150 mg via ORAL
  Filled 2016-10-23 (×2): qty 1

## 2016-10-23 MED ORDER — PALIPERIDONE PALMITATE 234 MG/1.5ML IM SUSP
234.0000 mg | Freq: Once | INTRAMUSCULAR | Status: AC
  Administered 2016-10-23: 234 mg via INTRAMUSCULAR
  Filled 2016-10-23: qty 1.5

## 2016-10-23 MED ORDER — PALIPERIDONE PALMITATE 156 MG/ML IM SUSP: 117.0000 mg | INTRAMUSCULAR | Status: DC

## 2016-10-23 NOTE — Progress Notes (Signed)
D: Patient denies SI or HI but endorses visual hallucinations stating, "its like seeing myself asleep but I'm awake". Patient has a depressed mood with flat/anxious affect.  Pt. States he did not get any rest last night, states he was very restless despite medication.  Pt. Has been up and interacting with staff and others on the unit as well as attending group.  Pt. Denies any physical complaints a this time.  Pt. Stated his goal for today was to work on getting to see his kids.    A: Patient given emotional support from RN. Patient encouraged to come to staff with concerns and/or questions. Patient's medication routine continued. Patient's orders and plan of care reviewed.   R: Patient remains appropriate and cooperative. Will continue to monitor patient q15 minutes for safety.

## 2016-10-23 NOTE — BHH Group Notes (Signed)
LCSW Group Therapy Note  10/23/2016 1:15pm  Type of Therapy/Topic:  Group Therapy:  Balance in Life  Participation Level:  Active  Description of Group:    This group will address the concept of balance and how it feels and looks when one is unbalanced. Patients will be encouraged to process areas in their lives that are out of balance and identify reasons for remaining unbalanced. Facilitators will guide patients in utilizing problem-solving interventions to address and correct the stressor making their life unbalanced. Understanding and applying boundaries will be explored and addressed for obtaining and maintaining a balanced life. Patients will be encouraged to explore ways to assertively make their unbalanced needs known to significant others in their lives, using other group members and facilitator for support and feedback.  Therapeutic Goals: 1. Patient will identify two or more emotions or situations they have that consume much of in their lives. 2. Patient will identify signs/triggers that life has become out of balance:  3. Patient will identify two ways to set boundaries in order to achieve balance in their lives:  4. Patient will demonstrate ability to communicate their needs through discussion and/or role plays  Summary of Patient Progress:  Leonard Guerrero did not remain in group the entire time and was in and out of the room.  He was an active participant while in group.  He states that he feels unbalanced and "checked out".  He misses being around his children and his mother.  He is focused on trying to get out of the hospital and back to his family.  He keeps himself calm by spending time with his grandchildren.      Therapeutic Modalities:   Cognitive Behavioral Therapy Solution-Focused Therapy Assertiveness Training  [name], Student-Social Work 10/23/2016 1:28 PM

## 2016-10-23 NOTE — BHH Suicide Risk Assessment (Signed)
[facility] Admission Suicide Risk Assessment   Nursing information obtained from:  Patient Demographic factors:  Caucasian, Low socioeconomic status, Living alone, Unemployed Current Mental Status:  Suicidal ideation indicated by patient, Suicide plan, Plan includes specific time, place, or method, Self-harm thoughts, Self-harm behaviors, Intention to act on suicide plan, Belief that plan would result in death Loss Factors:  NA Historical Factors:  Prior suicide attempts, Family history of mental illness or substance abuse Risk Reduction Factors:  Positive social support  Total Time spent with patient: 20 minutes Principal Problem: Schizophrenia (HCC) Diagnosis:   Patient Active Problem List   Diagnosis Date Noted  . Schizophrenia (HCC) [F20.9] 10/23/2016  . MDD (major depressive disorder), recurrent, severe, with psychosis (HCC) [F33.3] 10/23/2016  . Elevated TSH [R94.6] 06/18/2016  . Paranoid schizophrenia (HCC) [F20.0] 06/17/2016  . Major depressive disorder with single episode [F32.9] 06/17/2016  . Alcohol use disorder, severe, dependence (HCC) [F10.20] 06/17/2016  . Cocaine use disorder, moderate, dependence (HCC) [F14.20] 06/17/2016  . Alcoholic intoxication without complication (HCC) [F10.920]   . Acute encephalopathy [G93.40] 06/08/2016  . Acute respiratory failure (HCC) [J96.00]   . Altered mental status [R41.82]   . Encephalopathy acute [G93.40] 06/07/2016  . Chest pain [R07.9] 04/04/2015   Subjective Data: Please see H&P.   Continued Clinical Symptoms:  Alcohol Use Disorder Identification Test Final Score (AUDIT): 32 The "Alcohol Use Disorders Identification Test", Guidelines for Use in Primary Care, Second Edition.  World Science writer [employer]). Score between 0-7:  no or low risk or alcohol related problems. Score between 8-15:  moderate risk of alcohol related problems. Score between 16-19:  high risk of alcohol related problems. Score 20 or above:  warrants further  diagnostic evaluation for alcohol dependence and treatment.   CLINICAL FACTORS:   Alcohol/Substance Abuse/Dependencies Schizophrenia:   Paranoid or undifferentiated type Unstable or Poor Therapeutic Relationship Previous Psychiatric Diagnoses and Treatments   Musculoskeletal: Strength & Muscle Tone: within normal limits Gait & Station: normal Patient leans: N/A  Psychiatric Specialty Exam: Physical Exam  Review of Systems  Psychiatric/Behavioral: Positive for depression, hallucinations, substance abuse and suicidal ideas. The patient is nervous/anxious and has insomnia.   All other systems reviewed and are negative.   Blood pressure 119/78, pulse 80, temperature 98 F (36.7 C), temperature source Oral, resp. rate 18, height 5' 11.5" (1.816 m), weight 91.6 kg (202 lb), SpO2 97 %.Body mass index is 27.78 kg/m.                          Please see H&P.                                 COGNITIVE FEATURES THAT CONTRIBUTE TO RISK:  Closed-mindedness, Polarized thinking and Thought constriction (tunnel vision)    SUICIDE RISK:   Moderate:  Frequent suicidal ideation with limited intensity, and duration, some specificity in terms of plans, no associated intent, good self-control, limited dysphoria/symptomatology, some risk factors present, and identifiable protective factors, including available and accessible social support.  PLAN OF CARE: Please see H&P.   I certify that inpatient services furnished can reasonably be expected to improve the patient's condition.   Carmichael Burdette, MD 10/23/2016, 1:43 PM

## 2016-10-23 NOTE — Progress Notes (Signed)
Adult Psychoeducational Group Note  Date:  10/23/2016 Time:  2104   Group Topic/Focus:  Wrap-Up Group:   The focus of this group is to help patients review their daily goal of treatment and discuss progress on daily workbooks.  Participation Level:  Did not attend  Participation Quality:  n/a  Affect:  n/a  Cognitive:  n/a  Insight: n/a  Engagement in Group:  n/a  Modes of Intervention:  n/a  Additional Comments:  n/a 

## 2016-10-23 NOTE — H&P (Signed)
Psychiatric Admission Assessment Adult  Patient Identification: Leonard Guerrero MRN:  [id number] Date of Evaluation:  10/23/2016 Chief Complaint: Patient states " I am depressed.'  Principal Diagnosis: Schizophrenia ([place]) Diagnosis:   Patient Active Problem List   Diagnosis Date Noted  . Schizophrenia ([place]) [F20.9] 10/23/2016  . MDD (major depressive disorder), recurrent, severe, with psychosis ([place]) [F33.3] 10/23/2016  . Elevated TSH [R94.6] 06/18/2016  . Paranoid schizophrenia ([place]) [F20.0] 06/17/2016  . Major depressive disorder with single episode [F32.9] 06/17/2016  . Alcohol use disorder, severe, dependence ([place]) [F10.20] 06/17/2016  . Cocaine use disorder, moderate, dependence ([place]) [F14.20] 06/17/2016  . Alcoholic intoxication without complication ([place]) [M35.597]   . Acute encephalopathy [G93.40] 06/08/2016  . Acute respiratory failure ([place]) [J96.00]   . Altered mental status [R41.82]   . Encephalopathy acute [G93.40] 06/07/2016  . Chest pain [R07.9] 04/04/2015   History of Present Illness: Leonard Guerrero is a 7 y old CM, who is single , has a hx of schizophrenia as well as polysubstance abuse ,homeless, noncompliant with medications presented to [facility] s/p suicide attempt by cutting.  Patient seen and chart reviewed.Discussed patient with treatment team. Pt today seen as depressed , reports sadness all day , since the past several days , sleep issues, appetite is decreased, and has SI , s/p attempt , cut his wrist which is currently in dressing now. Pt reports AH asking him to kill self and take someone else with him. Pt reports he has been noncompliant with his Invega LAI injection due in august, he gets it every three months. Pt reports he went to live with his daughter in [place] after his discharge from IP unit at [facility] in may 2018. Pt however contacted his children who were adopted away from him  , whom he never met unit now . They live in [place] and he hence came here to visit them. He  started living with an aunt soon after that in [place] , however his aunt is sick and he is afraid he might find her dead one day and he will be blamed for it. He hence left her and currently stays here and there. Pt reports that he started abusing alcohol again and currently feels guilty and wants to get help.  Pt reports he did well on Lexapro and Invega LAI and wants to get back on it .   Associated Signs/Symptoms: Depression Symptoms:  depressed mood, anhedonia, insomnia, psychomotor retardation, fatigue, feelings of worthlessness/guilt, difficulty concentrating, hopelessness, suicidal thoughts with specific plan, suicidal attempt, anxiety, (Hypo) Manic Symptoms:  Distractibility, Hallucinations, Labiality of Mood, Anxiety Symptoms:  Excessive Worry, Psychotic Symptoms:  Delusions, Hallucinations: Auditory Command:  kill self and others Paranoia, PTSD Symptoms: Negative Total Time spent with patient: 45 minutes  Past Psychiatric History: Patient with hx of schizophrenia , alcohol abuse, hx of past admission to [facility] - May 2018. Pt reports past suicide attempt x 2, OD which required ICU admission in the past. Pt used to follow up with [facility].  Is the patient at risk to self? Yes.    Has the patient been a risk to self in the past 6 months? Yes.    Has the patient been a risk to self within the distant past? Yes.    Is the patient a risk to others? Yes.    Has the patient been a risk to others in the past 6 months? Yes.    Has the patient been a risk to others within the distant past? Yes.  Prior Inpatient Therapy:   Prior Outpatient Therapy:    Alcohol Screening: 1. How often do you have a drink containing alcohol?: 4 or more times a week 2. How many drinks containing alcohol do you have on a typical day when you are drinking?: 10 or more 3. How often do you have six or more drinks on one occasion?: Daily or almost daily Preliminary Score: 8 4. How often during the  last year have you found that you were not able to stop drinking once you had started?: Daily or almost daily 5. How often during the last year have you failed to do what was normally expected from you becasue of drinking?: Never 6. How often during the last year have you needed a first drink in the morning to get yourself going after a heavy drinking session?: Daily or almost daily 7. How often during the last year have you had a feeling of guilt of remorse after drinking?: Daily or almost daily 8. How often during the last year have you been unable to remember what happened the night before because you had been drinking?: Daily or almost daily 9. Have you or someone else been injured as a result of your drinking?: No 10. Has a relative or friend or a doctor or another health worker been concerned about your drinking or suggested you cut down?: Yes, during the last year Alcohol Use Disorder Identification Test Final Score (AUDIT): 32 Substance Abuse History in the last 12 months:  Yes.   alcohol, daily use , 5-6 40 oz , uds -pos for bzd. Consequences of Substance Abuse: Medical Consequences:  current admission Legal Consequences:  hx of breaking and entering charges Family Consequences:  relational issues Previous Psychotropic Medications: Yes invega , lexapro Psychological Evaluations: Yes  Past Medical History:  Past Medical History:  Diagnosis Date  . Bipolar 1 disorder ([place])   . ETOH abuse   . Hypertension   . Paranoid schizophrenia [facility])     Past Surgical History:  Procedure Laterality Date  . DENTAL SURGERY     complete extraction   Family History:  Family History  Problem Relation Age of Onset  . Mental illness Father   . Heart attack Father   . Hypertension Mother   . Mental illness Sister    Family Psychiatric  History: Sister has mental illness Tobacco Screening: Have you used any form of tobacco in the last 30 days? (Cigarettes, Smokeless Tobacco, Cigars, and/or  Pipes): Yes Tobacco use, Select all that apply: 5 or more cigarettes per day Are you interested in Tobacco Cessation Medications?: Yes, will notify MD for an order Counseled patient on smoking cessation including recognizing danger situations, developing coping skills and basic information about quitting provided: Yes Social History: single, currently homeless, gets SSD , has 6 children , went up to 9 th grade, mother lives in [place], father passed away from a heart attack. History  Alcohol Use  . 1.8 - 2.4 oz/week  . 3 - 4 Cans of beer per week     History  Drug use: Unknown    Additional Social History:                           Allergies:  No Known Allergies Lab Results: No results found for this or any previous visit (from the past 48 hour(s)).  Blood Alcohol level:  Lab Results  Component Value Date   ETH 6 (H) 10/20/2016  ETH 16 (H) 64/33/2951    Metabolic Disorder Labs:  Lab Results  Component Value Date   HGBA1C 5.1 06/18/2016   MPG 100 06/18/2016   Lab Results  Component Value Date   PROLACTIN 28.5 (H) 06/18/2016   Lab Results  Component Value Date   CHOL 213 (H) 06/18/2016   TRIG 181 (H) 06/18/2016   HDL 35 (L) 06/18/2016   CHOLHDL 6.1 06/18/2016   VLDL 36 06/18/2016   LDLCALC 142 (H) 06/18/2016    Current Medications: Current Facility-Administered Medications  Medication Dose Route Frequency Provider Last Rate Last Dose  . acamprosate (CAMPRAL) tablet 666 mg  666 mg Oral TID WC Ethelene Hal, NP   666 mg at 10/23/16 1308  . acetaminophen (TYLENOL) tablet 650 mg  650 mg Oral Q6H PRN Ethelene Hal, NP      . alum & mag hydroxide-simeth (MAALOX/MYLANTA) 200-200-20 MG/5ML suspension 30 mL  30 mL Oral Q4H PRN Ethelene Hal, NP      . cephALEXin [facility]) capsule 500 mg  500 mg Oral Q8H Ethelene Hal, NP   500 mg at 10/23/16 1309  . gabapentin (NEURONTIN) capsule 200 mg  200 mg Oral BID Ethelene Hal, NP    200 mg at 10/23/16 0932  . hydrOXYzine (ATARAX/VISTARIL) tablet 25 mg  25 mg Oral Q6H PRN Ethelene Hal, NP      . magnesium hydroxide (MILK OF MAGNESIA) suspension 30 mL  30 mL Oral Daily PRN Ethelene Hal, NP      . nicotine (NICODERM CQ - dosed in mg/24 hours) patch 21 mg  21 mg Transdermal Q0600 Laverle Hobby, PA-C   21 mg at 10/23/16 0631  . paliperidone (INVEGA) 24 hr tablet 6 mg  6 mg Oral Daily Ethelene Hal, NP   6 mg at 10/23/16 0932  . traZODone (DESYREL) tablet 100 mg  100 mg Oral QHS Ethelene Hal, NP   100 mg at 10/22/16 2056  . trihexyphenidyl (ARTANE) tablet 5 mg  5 mg Oral TID WC Ethelene Hal, NP   5 mg at 10/23/16 1308   PTA Medications: Prescriptions Prior to Admission  Medication Sig Dispense Refill Last Dose  . acamprosate (CAMPRAL) 333 MG tablet Take 2 tablets (666 mg total) by mouth 3 (three) times daily with meals. For alcoholism (Patient not taking: Reported on 10/20/2016) 180 tablet 0 Not Taking at Unknown time  . amLODipine (NORVASC) 5 MG tablet Take 1 tablet (5 mg total) by mouth daily. (Patient not taking: Reported on 10/20/2016) 90 tablet 1 Not Taking at Unknown time  . benztropine (COGENTIN) 1 MG tablet Take 1 mg by mouth every morning. & 1/2 tablet at bedtime   Not Taking at Unknown time  . benztropine (COGENTIN) 2 MG tablet Take 1 tablet (2 mg total) by mouth 2 (two) times daily. For prevention of drug induced tremors. (Patient not taking: Reported on 10/20/2016) 60 tablet 0 Not Taking at Unknown time  . chlordiazePOXIDE (LIBRIUM) 25 MG capsule 2m PO TID x 1D, then 25-526mPO BID X 1D, then 25-5075mO QD X 1D (Patient not taking: Reported on 10/20/2016) 10 capsule 0 Not Taking at Unknown time  . escitalopram (LEXAPRO) 5 MG tablet Take 1 tablet (5 mg total) by mouth daily. For depression (Patient not taking: Reported on 10/20/2016) 30 tablet 0 Not Taking at Unknown time  . folic acid (FOLVITE) 1 MG tablet Take 1 tablet (1 mg  total) by mouth daily.  For low Folate replacement (Patient not taking: Reported on 10/20/2016) 10 tablet 0 Not Taking at Unknown time  . haloperidol (HALDOL) 0.5 MG tablet Take 0.5 mg by mouth 2 (two) times daily.   Not Taking at Unknown time  . hydrOXYzine (ATARAX/VISTARIL) 25 MG tablet Take 1 tablet (25 mg total) by mouth every 6 (six) hours as needed for anxiety (Sleep). (Patient not taking: Reported on 10/20/2016) 60 tablet 0 Not Taking at Unknown time  . lamoTRIgine (LAMICTAL) 25 MG tablet Take 25 mg by mouth 2 (two) times daily.   Not Taking at Unknown time  . nicotine polacrilex (NICORETTE) 2 MG gum Take 1 each (2 mg total) by mouth as needed for smoking cessation. (Patient not taking: Reported on 10/20/2016) 100 tablet 0 Not Taking at Unknown time  . Paliperidone Palmitate (INVEGA TRINZA IM) Inject into the muscle.   Not Taking at Unknown time  . Paliperidone Palmitate 546 MG/1.75ML SUSP Inject 546 mg into the muscle every 3 (three) months. (Due 06-27-16): For mood control (Patient not taking: Reported on 10/20/2016) 1.75 mL 0 Not Taking at Unknown time  . QUEtiapine (SEROQUEL XR) 400 MG 24 hr tablet Take 800 mg by mouth at bedtime.   Not Taking at Unknown time  . thiamine 100 MG tablet Take 1 tablet (100 mg total) by mouth daily. For low thiamine replacement (Patient not taking: Reported on 10/20/2016) 10 tablet 0 Not Taking at Unknown time  . traZODone (DESYREL) 50 MG tablet Take 1 tablet (50 mg total) by mouth at bedtime as needed for sleep. (Patient not taking: Reported on 10/20/2016) 30 tablet 0 Not Taking at Unknown time    Musculoskeletal: Strength & Muscle Tone: within normal limits Gait & Station: normal Patient leans: N/A  Psychiatric Specialty Exam: Physical Exam  Review of Systems  Psychiatric/Behavioral: Positive for depression, hallucinations, substance abuse and suicidal ideas. The patient is nervous/anxious and has insomnia.   All other systems reviewed and are negative.    Blood pressure 119/78, pulse 80, temperature 98 F (36.7 C), temperature source Oral, resp. rate 18, height 5' 11.5" (1.816 m), weight 91.6 kg (202 lb), SpO2 97 %.Body mass index is 27.78 kg/m.  General Appearance: Guarded  Eye Contact:  Fair  Speech:  Normal Rate  Volume:  Normal  Mood:  Anxious and Depressed  Affect:  Congruent  Thought Process:  Goal Directed and Descriptions of Associations: Circumstantial  Orientation:  Full (Time, Place, and Person)  Thought Content:  Delusions, Hallucinations: Auditory Command:  kill self and others, Paranoid Ideation and Rumination  Suicidal Thoughts:  Yes.  with intent/plan, s/p suicide attempt, contracts for safety on unit  Homicidal Thoughts:  No  Memory:  Immediate;   Fair Recent;   Fair Remote;   Fair  Judgement:  Impaired  Insight:  Shallow  Psychomotor Activity:  Normal  Concentration:  Concentration: Fair and Attention Span: Fair  Recall:  [employer] of Knowledge:  Fair  Language:  Fair  Akathisia:  No  Handed:  Right  AIMS (if indicated):     Assets:  Communication Skills Desire for Improvement  ADL's:  Intact  Cognition:  WNL  Sleep:  Number of Hours: 1    Treatment Plan Summary:Patient with schizophrenia as well as polysubstance abuse , presented with worsening depressive sx, S/P suicide attempt by cutting wrist , will restart medications , observe on the unit. Daily contact with patient to assess and evaluate symptoms and progress in treatment, Medication management and Plan  see below Patient will benefit from inpatient treatment and stabilization.  Estimated length of stay is 5-7 days.  Reviewed past medical records,treatment plan.  Restart Lexapro 10 mg po daily for affective sx. Gabapentin 200 mg po bid for anxiety sx. Invega ER 6 mg po daily for psychosis. Trazodone increase to 150 mg po qhs for sleep. Artane 5 mg po tid for EPS. Will continue to monitor vitals ,medication compliance and treatment side effects  while patient is here.  Will monitor for medical issues as well as call consult as needed.  Reviewed labs Platelets - low - will repeat , AST/ALT elevated , will get acute hepatitis panel. Will order EKG for qtc monitoring.  CSW will start working on disposition.  Patient to participate in therapeutic milieu .      Observation Level/Precautions:  15 minute checks    Psychotherapy:  Individual and group therapy     Consultations:  CSW  Discharge Concerns:  Stability and safety       Physician Treatment Plan for Primary Diagnosis: Schizophrenia ([place]) Long Term Goal(s): Improvement in symptoms so as ready for discharge  Short Term Goals: Ability to verbalize feelings will improve, Compliance with prescribed medications will improve and Ability to identify triggers associated with substance abuse/mental health issues will improve  Physician Treatment Plan for Secondary Diagnosis: Principal Problem:   Schizophrenia ([place]) Active Problems:   Alcohol use disorder, severe, dependence ([place])   Cocaine use disorder, moderate, dependence ([place])   MDD (major depressive disorder), recurrent, severe, with psychosis ([place])  Long Term Goal(s): Improvement in symptoms so as ready for discharge  Short Term Goals: Ability to verbalize feelings will improve, Compliance with prescribed medications will improve and Ability to identify triggers associated with substance abuse/mental health issues will improve  I certify that inpatient services furnished can reasonably be expected to improve the patient's condition.    Ursula Alert, MD 9/19/20181:44 PM

## 2016-10-23 NOTE — BHH Counselor (Signed)
Adult Comprehensive Assessment  Patient ID: Leonard Guerrero, male   DOB: 09-22-1975, 41 y.o.   MRN: [id number]   Information Source: Information source: Patient  Current Stressors:  Educational / Learning stressors: 9th grade-dropped out due to getting in fights Employment / Job issues: on disability currently. unemployed Family Relationships: close to oldest daughter and his mother; no relationship with father or five other children Museum/gallery curator / Lack of resources (include bankruptcy): disability income; [employer] / Lack of housing: Insurance account manager with mother currently Physical health (include injuries & life threatening diseases): none identified Social relationships: few friends in community; mother is biggest local support. oldest daughter is supportive. divorced x2 Substance abuse: Alcohol, cocaine Bereavement / Loss: none identified.   Living/Environment/Situation:  Living Arrangements: Here and there-staying with mom more recently-left here in May to stay with daughter and her children in [place]-was there through August-returned and has been "homeless" since Living conditions (as described by patient or guardian): Wants to stay in hotel or boarding houseHow long has patient lived in current situation?: a week or 2   What is atmosphere in current home: Temporary  Family History:  Marital status: Divorced Divorced, when?: 14 years ago, pt divorced his 2nd wife.  What types of issues is patient dealing with in the relationship?: alcoholism Additional relationship information: n/a  Are you sexually active?: Yes What is your sexual orientation?: heterosexual Has your sexual activity been affected by drugs, alcohol, medication, or emotional stress?: n/a  Does patient have children?: Yes How many children?: 6 How is patient's relationship with their children?: 3 boys; 3 girls. "I don't have a relationship with any of them but my oldest daughter. "I'm moving in with her in [facility] when I leave  the hospital."  Childhood History:  By whom was/is the patient raised?: Mother, Grandparents Additional childhood history information: pt's maternal grandparents raised him; "My mom lived 2 houses down." his mother waws 53yo when she gave birth to pt and did not actively care for him until he was older Description of patient's relationship with caregiver when they were a child: close to grandparents and mother; did not know biological father as a child Patient's description of current relationship with people who raised him/her: close to mother; grandparents deceased; "I met my dad once. We didn't get along very well." How were you disciplined when you got in trouble as a child/adolescent?: n/a  Does patient have siblings?: Yes Number of Siblings: 5 Description of patient's current relationship with siblings: five sisters; one older and 4 younger. "We are all pretty close still."  Did patient suffer any verbal/emotional/physical/sexual abuse as a child?: No Did patient suffer from severe childhood neglect?: No Has patient ever been sexually abused/assaulted/raped as an adolescent or adult?: No Was the patient ever a victim of a crime or a disaster?: No Witnessed domestic violence?: No Has patient been effected by domestic violence as an adult?: No  Education:  Highest grade of school patient has completed: 9th grade. pt got in fights and had behavioral issues so he dropped out in 9th grade  Currently a student?: No Learning disability?: Yes What learning problems does patient have?: undiagnosed but pt reports significant learning difficulties in addition to behavioral problems.   Employment/Work Situation:   Employment situation: On disability Why is patient on disability: mental illness How long has patient been on disability: few years  Patient's job has been impacted by current illness: No What is the longest time patient has a held a job?: n/a  Where was the patient employed at  that time?: n/a  Has patient ever been in the [employer]?: No Has patient ever served in combat?: No Did You Receive Any Psychiatric Treatment/Services While in Passenger transport manager?: No Are There Guns or Other Weapons in [place]?: No Are These Weapons Safely Secured?:  (n/a)  Financial Resources:   Financial resources: [employer], [employer], Food stamps Does patient have a Programmer, applications or guardian?: No  Alcohol/Substance Abuse:   What has been your use of drugs/alcohol within the last 12 months?: Alcohol, cocaine If attempted suicide, did drugs/alcohol play a role in this?:  Alcohol/Substance Abuse Treatment Hx: Past Tx, Outpatient If yes, describe treatment: hx at [facility] in [place] and [place] in [facility] for outpatient services.  Has alcohol/substance abuse ever caused legal problems?: No  Social Support System:   Heritage manager System: Poor Describe Community Support System: few supportive friends in community; oldest daughter and his mother are biggest family supports Type of faith/religion: christian How does patient's faith help to cope with current illness?: prayer   Leisure/Recreation:   Leisure and Hobbies: spending time with my grandkids  Strengths/Needs:   What things does the patient do well?: grandfather In what areas does patient struggle / problems for patient: impulsivity and insight are lacking   Discharge Plan:   Does patient have access to transportation?: Yes (bus) Will patient be returning to same living situation after discharge?: Yes Currently receiving community mental health services: Yes (From Whom) [facility]) If no, would patient like referral for services when discharged?:  Does patient have financial barriers related to discharge medications?: No ([place] Medicaid. )    Summary/Recommendations:   Summary and Recommendations (to be completed by the evaluator): Leonard Guerrero is a 41 YO Caucasian male diagnosed with  Schizophrenia, Alcohol Use and Cocaine Use.  He presnts with AH, SI and withdrawal symptoms.  At d/c, Kaylob states he will return home with his mother if it is prior to the 1st; if after he will get an apartment or boarding house room.  In the meantime, he can benefit from criese stabilization, medication management, therapeutic milieu and referral for services.  Trish Mage. 10/23/2016

## 2016-10-23 NOTE — Progress Notes (Signed)
Patient received his monthly Invega sustainer at HS. The medication was not available during the day shift per report. Patient tolerated the medication. Although he remain paranoid and actively psychotic and tried to go out through the back door several times..His room mate was moved during the day shift per report and his new room mate had to be moved again tonight because he was extremely intrusive and actively psychotic. He denied SI/HIand denied Hallucinations. Writer requested for A Do Not Admit order and a repeat dose of Trazodone for patient. He denied SI/HI and denied hallucinations. Although he continues to talk to himself, responding to internal stimuli, tangential, intrusive and disorganized. Q 15 minute check continues for safety.

## 2016-10-23 NOTE — Progress Notes (Signed)
Recreation Therapy Notes  Date: 10/23/16 Time: 1000 Location: 500 Hall Dayroom  Group Topic: Communication  Goal Area(s) Addresses:  Patient will effectively communicate with peers in group.  Patient will verbalize benefit of healthy communication. Patient will verbalize positive effect of healthy communication on post d/c goals.  Patient will identify communication techniques that made activity effective for group.   Intervention:  Futures trader, blank paper and pencils   Activity: Acupuncturist.  Group was divided into teams of 2 and seated back to back.  One person was the listener and the other was the talker but each person would get a chance to do both roles.  The talker was given a picture of geometrical shapes which they had to describe to the listener.  The listener had to draw the picture as it was described to them.  Education: Communication, Discharge Planning  Education Outcome: Acknowledges understanding/In group clarification offered/Needs additional education.   Clinical Observations/Feedback:  Pt did not attend group.     Caroll Rancher, LRT/CTRS         Lillia Abed, Jaishaun Mcnab A 10/23/2016 11:58 AM

## 2016-10-23 NOTE — Tx Team (Signed)
Interdisciplinary Treatment and Diagnostic Plan Update  10/23/2016 Time of Session: 3:51 PM  Leonard Guerrero MRN: [id number]  Principal Diagnosis: Schizophrenia [facility])  Secondary Diagnoses: Principal Problem:   Schizophrenia (HCC) Active Problems:   Alcohol use disorder, severe, dependence (HCC)   Cocaine use disorder, moderate, dependence (HCC)   MDD (major depressive disorder), recurrent, severe, with psychosis (HCC)   Current Medications:  Current Facility-Administered Medications  Medication Dose Route Frequency Provider Last Rate Last Dose  . acetaminophen (TYLENOL) tablet 650 mg  650 mg Oral Q6H PRN Laveda Abbe, NP      . alum & mag hydroxide-simeth (MAALOX/MYLANTA) 200-200-20 MG/5ML suspension 30 mL  30 mL Oral Q4H PRN Laveda Abbe, NP      . cephALEXin [facility]) capsule 500 mg  500 mg Oral Q8H Laveda Abbe, NP   500 mg at 10/23/16 1309  . escitalopram (LEXAPRO) tablet 10 mg  10 mg Oral Daily Eappen, Levin Bacon, MD   10 mg at 10/23/16 1513  . gabapentin (NEURONTIN) capsule 200 mg  200 mg Oral BID Laveda Abbe, NP   200 mg at 10/23/16 0932  . hydrOXYzine (ATARAX/VISTARIL) tablet 25 mg  25 mg Oral Q6H PRN Laveda Abbe, NP      . magnesium hydroxide (MILK OF MAGNESIA) suspension 30 mL  30 mL Oral Daily PRN Laveda Abbe, NP      . nicotine (NICODERM CQ - dosed in mg/24 hours) patch 21 mg  21 mg Transdermal Q0600 Kerry Hough, PA-C   21 mg at 10/23/16 0631  . [START ON 11/20/2016] paliperidone (INVEGA SUSTENNA) injection 117 mg  117 mg Intramuscular Q28 days Jomarie Longs, MD      . Melene Muller ON 10/28/2016] paliperidone (INVEGA SUSTENNA) injection 156 mg  156 mg Intramuscular Once Eappen, Saramma, MD      . paliperidone (INVEGA SUSTENNA) injection 234 mg  234 mg Intramuscular Once Eappen, Saramma, MD      . paliperidone (INVEGA) 24 hr tablet 6 mg  6 mg Oral Daily Laveda Abbe, NP   6 mg at 10/23/16 0932  . traZODone (DESYREL)  tablet 150 mg  150 mg Oral QHS Eappen, Saramma, MD      . trihexyphenidyl (ARTANE) tablet 5 mg  5 mg Oral TID WC Laveda Abbe, NP   5 mg at 10/23/16 1308    PTA Medications: Prescriptions Prior to Admission  Medication Sig Dispense Refill Last Dose  . acamprosate (CAMPRAL) 333 MG tablet Take 2 tablets (666 mg total) by mouth 3 (three) times daily with meals. For alcoholism (Patient not taking: Reported on 10/20/2016) 180 tablet 0 Not Taking at Unknown time  . amLODipine (NORVASC) 5 MG tablet Take 1 tablet (5 mg total) by mouth daily. (Patient not taking: Reported on 10/20/2016) 90 tablet 1 Not Taking at Unknown time  . benztropine (COGENTIN) 1 MG tablet Take 1 mg by mouth every morning. & 1/2 tablet at bedtime   Not Taking at Unknown time  . benztropine (COGENTIN) 2 MG tablet Take 1 tablet (2 mg total) by mouth 2 (two) times daily. For prevention of drug induced tremors. (Patient not taking: Reported on 10/20/2016) 60 tablet 0 Not Taking at Unknown time  . chlordiazePOXIDE (LIBRIUM) 25 MG capsule  PO TID x 1D, then 25-50mg  PO BID X 1D, then 25-50mg  PO QD X 1D (Patient not taking: Reported on 10/20/2016) 10 capsule 0 Not Taking at Unknown time  . escitalopram (LEXAPRO) 5 MG tablet Take 1 tablet (  5 mg total) by mouth daily. For depression (Patient not taking: Reported on 10/20/2016) 30 tablet 0 Not Taking at Unknown time  . folic acid (FOLVITE) 1 MG tablet Take 1 tablet (1 mg total) by mouth daily. For low Folate replacement (Patient not taking: Reported on 10/20/2016) 10 tablet 0 Not Taking at Unknown time  . haloperidol (HALDOL) 0.5 MG tablet Take 0.5 mg by mouth 2 (two) times daily.   Not Taking at Unknown time  . hydrOXYzine (ATARAX/VISTARIL) 25 MG tablet Take 1 tablet (25 mg total) by mouth every 6 (six) hours as needed for anxiety (Sleep). (Patient not taking: Reported on 10/20/2016) 60 tablet 0 Not Taking at Unknown time  . lamoTRIgine (LAMICTAL) 25 MG tablet Take 25 mg by mouth 2 (two)  times daily.   Not Taking at Unknown time  . nicotine polacrilex (NICORETTE) 2 MG gum Take 1 each (2 mg total) by mouth as needed for smoking cessation. (Patient not taking: Reported on 10/20/2016) 100 tablet 0 Not Taking at Unknown time  . Paliperidone Palmitate (INVEGA TRINZA IM) Inject into the muscle.   Not Taking at Unknown time  . Paliperidone Palmitate 546 MG/1.75ML SUSP Inject 546 mg into the muscle every 3 (three) months. (Due 06-27-16): For mood control (Patient not taking: Reported on 10/20/2016) 1.75 mL 0 Not Taking at Unknown time  . QUEtiapine (SEROQUEL XR) 400 MG 24 hr tablet Take 800 mg by mouth at bedtime.   Not Taking at Unknown time  . thiamine 100 MG tablet Take 1 tablet (100 mg total) by mouth daily. For low thiamine replacement (Patient not taking: Reported on 10/20/2016) 10 tablet 0 Not Taking at Unknown time  . traZODone (DESYREL) 50 MG tablet Take 1 tablet (50 mg total) by mouth at bedtime as needed for sleep. (Patient not taking: Reported on 10/20/2016) 30 tablet 0 Not Taking at Unknown time    Treatment Modalities: Medication Management, Group therapy, Case management,  1 to 1 session with clinician, Psychoeducation, Recreational therapy.  Patient Stressors: Medication change or noncompliance Substance abuse  Patient Strengths: Barrister's clerk for treatment/growth Supportive family/friends   Physician Treatment Plan for Primary Diagnosis: Schizophrenia (HCC) Long Term Goal(s): Improvement in symptoms so as ready for discharge  Short Term Goals: Ability to verbalize feelings will improve Compliance with prescribed medications will improve Ability to identify triggers associated with substance abuse/mental health issues will improve Ability to verbalize feelings will improve Compliance with prescribed medications will improve Ability to identify triggers associated with substance abuse/mental health issues will improve  Medication Management:  Evaluate patient's response, side effects, and tolerance of medication regimen.  Therapeutic Interventions: 1 to 1 sessions, Unit Group sessions and Medication administration.  Evaluation of Outcomes: Progressing  Physician Treatment Plan for Secondary Diagnosis: Principal Problem:   Schizophrenia (HCC) Active Problems:   Alcohol use disorder, severe, dependence (HCC)   Cocaine use disorder, moderate, dependence (HCC)   MDD (major depressive disorder), recurrent, severe, with psychosis (HCC)  Long Term Goal(s): Improvement in symptoms so as ready for discharge  Short Term Goals: Ability to verbalize feelings will improve Compliance with prescribed medications will improve Ability to identify triggers associated with substance abuse/mental health issues will improve Ability to verbalize feelings will improve Compliance with prescribed medications will improve Ability to identify triggers associated with substance abuse/mental health issues will improve  Medication Management: Evaluate patient's response, side effects, and tolerance of medication regimen.  Therapeutic Interventions: 1 to 1 sessions, Unit Group sessions and Medication administration.  Evaluation of Outcomes: Progressing   RN Treatment Plan for Primary Diagnosis: Schizophrenia (HCC) Long Term Goal(s): Knowledge of disease and therapeutic regimen to maintain health will improve  Short Term Goals: Ability to remain free from injury will improve, Ability to disclose and discuss suicidal ideas, Ability to identify and develop effective coping behaviors will improve and Compliance with prescribed medications will improve  Medication Management: RN will administer medications as ordered by provider, will assess and evaluate patient's response and provide education to patient for prescribed medication. RN will report any adverse and/or side effects to prescribing provider.  Therapeutic Interventions: 1 on 1 counseling  sessions, Psychoeducation, Medication administration, Evaluate responses to treatment, Monitor vital signs and CBGs as ordered, Perform/monitor CIWA, COWS, AIMS and Fall Risk screenings as ordered, Perform wound care treatments as ordered.  Evaluation of Outcomes: Progressing   LCSW Treatment Plan for Primary Diagnosis: Schizophrenia (HCC) Long Term Goal(s): Safe transition to appropriate next level of care at discharge, Engage patient in therapeutic group addressing interpersonal concerns.  Short Term Goals: Engage patient in aftercare planning with referrals and resources, Facilitate acceptance of mental health diagnosis and concerns, Facilitate patient progression through stages of change regarding substance use diagnoses and concerns, Identify triggers associated with mental health/substance abuse issues and Increase skills for wellness and recovery  Therapeutic Interventions: Assess for all discharge needs, 1 to 1 time with Social worker, Explore available resources and support systems, Assess for adequacy in community support network, Educate family and significant other(s) on suicide prevention, Complete Psychosocial Assessment, Interpersonal group therapy.  Evaluation of Outcomes: Progressing   Progress in Treatment: Attending groups: Yes Participating in groups: Yes Taking medication as prescribed: Yes Toleration of medication: Yes, no side effects reported at this time Family/Significant other contact made: No  Patient understands diagnosis: No, limited insight  Discussing patient identified problems/goals with staff: Yes Medical problems stabilized or resolved: Yes Denies suicidal/homicidal ideation: Yes Issues/concerns per patient self-inventory: None Other: N/A  New problem(s) identified: None identified at this time.   New Short Term/Long Term Goal(s): Pt stated that he wants help "Getting out of the hospital and I got everything that I needed in the emergency room".    Discharge Plan or Barriers: He states that if he leaves before the first of the month when he gets his check he will go home with mom.  If he leaves after the first of the month he will get a hotel room on his own.   Reason for Continuation of Hospitalization: Hallucinations Medication stabilization Suicidal ideation Withdrawal symptoms  Estimated Length of Stay: 10/28/2016   Attendees: Patient: Leonard Guerrero  10/23/2016  3:51 PM  Physician: Jomarie Longs, MD 10/23/2016  3:51 PM  Nursing: Estella Husk, RN 10/23/2016  3:51 PM  RN Care Manager: Onnie Boer, RN 10/23/2016  3:51 PM  Social Worker: Richelle Ito, LCSW; Melba Coon, Social Work Intern 10/23/2016  3:51 PM  Recreational Therapist: Caroll Rancher, LRT 10/23/2016  3:51 PM  Other: Tomasita Morrow, P4CC 10/23/2016  3:51 PM  Other:  10/23/2016  3:51 PM  Other: 10/23/2016  3:51 PM    Scribe for Treatment Team: [name], Student-Social Work 10/23/2016 3:51 PM

## 2016-10-24 DIAGNOSIS — R45851 Suicidal ideations: Secondary | ICD-10-CM

## 2016-10-24 DIAGNOSIS — F39 Unspecified mood [affective] disorder: Secondary | ICD-10-CM

## 2016-10-24 DIAGNOSIS — R451 Restlessness and agitation: Secondary | ICD-10-CM

## 2016-10-24 LAB — CBC WITH DIFFERENTIAL/PLATELET
BASOS ABS: 0 10*3/uL (ref 0.0–0.1)
Basophils Relative: 0 %
EOS ABS: 0.2 10*3/uL (ref 0.0–0.7)
EOS PCT: 3 %
HCT: 42.5 % (ref 39.0–52.0)
Hemoglobin: 14.8 g/dL (ref 13.0–17.0)
LYMPHS PCT: 45 %
Lymphs Abs: 3.1 10*3/uL (ref 0.7–4.0)
MCH: 31.6 pg (ref 26.0–34.0)
MCHC: 34.8 g/dL (ref 30.0–36.0)
MCV: 90.8 fL (ref 78.0–100.0)
MONO ABS: 0.7 10*3/uL (ref 0.1–1.0)
Monocytes Relative: 10 %
Neutro Abs: 2.9 10*3/uL (ref 1.7–7.7)
Neutrophils Relative %: 42 %
PLATELETS: 122 10*3/uL — AB (ref 150–400)
RBC: 4.68 MIL/uL (ref 4.22–5.81)
RDW: 14.5 % (ref 11.5–15.5)
WBC: 6.9 10*3/uL (ref 4.0–10.5)

## 2016-10-24 MED ORDER — TRAZODONE HCL 100 MG PO TABS
200.0000 mg | ORAL_TABLET | Freq: Every evening | ORAL | Status: DC | PRN
  Administered 2016-10-24 – 2016-10-29 (×7): 200 mg via ORAL
  Filled 2016-10-24 (×8): qty 2
  Filled 2016-10-24: qty 14
  Filled 2016-10-24: qty 4
  Filled 2016-10-24 (×4): qty 2
  Filled 2016-10-24: qty 14
  Filled 2016-10-24 (×2): qty 2

## 2016-10-24 NOTE — Progress Notes (Signed)
Pt dressing changed to left wrist. No drainage or bleeding noted. No odor or swelling noted. Tender to touch. Pt refused Tylenol for pain.

## 2016-10-24 NOTE — [facility] Group Notes (Signed)
LCSW Group Therapy Note   10/24/2016 1:15pm   Type of Therapy and Topic:  Group Therapy:  Positive Affirmations   Participation Level:  Did Not Attend  Description of Group: This group addressed positive affirmation toward self and others. Patients went around the room and identified two positive things about themselves and two positive things about a peer in the room. Patients reflected on how it felt to share something positive with others, to identify positive things about themselves, and to hear positive things from others. Patients were encouraged to have a daily reflection of positive characteristics or circumstances.  Therapeutic Goals 1. Patient will verbalize two of their positive qualities 2. Patient will demonstrate empathy for others by stating two positive qualities about a peer in the group 3. Patient will verbalize their feelings when voicing positive self affirmations and when voicing positive affirmations of others 4. Patients will discuss the potential positive impact on their wellness/recovery of focusing on positive traits of self and others. Summary of Patient Progress:    Therapeutic Modalities Cognitive Behavioral Therapy Motivational Interviewing  Carlynn Herald Work 10/24/2016 1:59 PM

## 2016-10-24 NOTE — Progress Notes (Signed)
Recreation Therapy Notes  Date: 10/24/16 Time: 1000 Location: 500 Hall Dayroom  Group Topic: Communication, Team Building, Problem Solving  Goal Area(s) Addresses:  Patient will effectively work with peer towards shared goal.  Patient will identify skill used to make activity successful.  Patient will identify how skills used during activity can be used to reach post d/c goals.   Intervention: STEM Activity   Activity: [employer]. Patients were provided the following materials: 5 drinking straws, 5 rubber bands, 5 paper clips, 2 index cards, 2 drinking cups, and 2 toilet paper rolls. Using the provided materials patients were asked to build a launching mechanisms to launch a ping pong ball approximately 12 feet. Patients were divided into teams of 3-5.   Education: Pharmacist, community, Building control surveyor.   Education Outcome: Acknowledges education/In group clarification offered/Needs additional education.   Clinical Observations/Feedback: Pt did not attend group.   Caroll Rancher, LRT/CTRS         Lillia Abed, Elianie Hubers A 10/24/2016 11:28 AM

## 2016-10-24 NOTE — Progress Notes (Signed)
Adult Psychoeducational Group Note  Date:  10/24/2016 Time:  9:47 PM  Group Topic/Focus:  Wrap-Up Group:   The focus of this group is to help patients review their daily goal of treatment and discuss progress on daily workbooks.  Participation Level:  Did Not Attend  Participation Quality:  Did not attend  Affect:  Did not attend  Cognitive:  Did not attend  Insight: None  Engagement in Group:  Did not attend  Modes of Intervention:  Did not attend  Additional Comments:  Pt did not attend evening wrap up group tonight.  Felipa Furnace 10/24/2016, 9:47 PM

## 2016-10-24 NOTE — Plan of Care (Signed)
Problem: Safety: Goal: Periods of time without injury will increase Outcome: Progressing Pt safe at this time   

## 2016-10-24 NOTE — Progress Notes (Signed)
D: Pt presents with a flat affect and anxious mood. Pt appears paranoid and preoccupied on approach. Pt thoughts are disorganized and speech is tangential. Pt endorses AVH. Pt reports feeling depressed and anxious today. Pt denies SI.  A: Medications administered as ordered per MD. Medications reviewed with pt. Verbal support provided. Pt encouraged to attend groups. 15 minute checks performed for safety. R: Pt compliant with tx.

## 2016-10-24 NOTE — Progress Notes (Signed)
D: Pt denies SI/HI, pt endorsed passive AH . Pt is pleasant and cooperative. Pt appeared sad/ depressed this evening   A: Pt was offered support and encouragement. Pt was given scheduled medications. Pt was encourage to attend groups. Q 15 minute checks were done for safety.    R: Pt is taking medication. Pt receptive to treatment and safety maintained on unit.

## 2016-10-24 NOTE — Progress Notes (Signed)
Leonard Guerrero Progress Note  10/24/2016 4:25 PM [place]  MRN:  [id number]  Subjective: Leonard Guerrero reports, "I still feel depressed. I did not sleep well last night. That is why I feel sleepy this morning. I feel really tired".  Objective:  Leonard Guerrero is a 43 y old CM, who is single , has a hx of schizophrenia as well as polysubstance abuse ,homeless, noncompliant with medications presented to [facility] s/p suicide attempt by cutting. Leonard Guerrero is seen, chart reviewed. Discussed this case with the treatment team. Leonard Guerrero is lying down in his bed sleeping. He says he did not sleep well last night & that is the reason he is sleepy today. Howveer, he did says that he attended 1 group sessions so far today. The dressing to the self-inflicted laceration to his left wrist intact.  He currently denies any problems. No disruptive behavior on the unit. Patient does not appear to be responding to any internal stimuli. He is taking & tolerating his treatment regimen. Denies any side effects.  Principal Problem: Schizophrenia (HCC)  Diagnosis:   Patient Active Problem List   Diagnosis Date Noted  . Schizophrenia (HCC) [F20.9] 10/23/2016  . MDD (major depressive disorder), recurrent, severe, with psychosis (HCC) [F33.3] 10/23/2016  . Elevated TSH [R94.6] 06/18/2016  . Paranoid schizophrenia (HCC) [F20.0] 06/17/2016  . Major depressive disorder with single episode [F32.9] 06/17/2016  . Alcohol use disorder, severe, dependence (HCC) [F10.20] 06/17/2016  . Cocaine use disorder, moderate, dependence (HCC) [F14.20] 06/17/2016  . Alcoholic intoxication without complication (HCC) [F10.920]   . Acute encephalopathy [G93.40] 06/08/2016  . Acute respiratory failure (HCC) [J96.00]   . Altered mental status [R41.82]   . Encephalopathy acute [G93.40] 06/07/2016  . Chest pain [R07.9] 04/04/2015   Total Time spent with patient: 25 minutes  Past Psychiatric History: Schizoaffective disorder.  Past Medical History:  Past Medical  History:  Diagnosis Date  . Bipolar 1 disorder (HCC)   . ETOH abuse   . Hypertension   . Paranoid schizophrenia [facility])     Past Surgical History:  Procedure Laterality Date  . DENTAL SURGERY     complete extraction   Family History:  Family History  Problem Relation Age of Onset  . Mental illness Father   . Heart attack Father   . Hypertension Mother   . Mental illness Sister    Family Psychiatric  History: See H&P.  Social History:  History  Alcohol Use  . 1.8 - 2.4 oz/week  . 3 - 4 Cans of beer per week     History  Drug use: Unknown    Social History   Social History  . Marital status: Unknown    Spouse name: N/A  . Number of children: N/A  . Years of education: N/A   Social History Main Topics  . Smoking status: Current Every Day Smoker    Packs/day: 1.00    Years: 10.00    Types: Cigarettes  . Smokeless tobacco: Never Used  . Alcohol use 1.8 - 2.4 oz/week    3 - 4 Cans of beer per week  . Drug use: Unknown  . Sexual activity: Not Asked   Other Topics Concern  . None   Social History Narrative   ** Merged History Encounter **       Additional Social History:   Sleep: Poor  Appetite:  Fair  Current Medications: Current Facility-Administered Medications  Medication Dose Route Frequency Provider Last Rate Last Dose  . acetaminophen (TYLENOL) tablet 650 mg  650 mg Oral Q6H PRN Laveda Abbe, NP      . alum & mag hydroxide-simeth (MAALOX/MYLANTA) 200-200-20 MG/5ML suspension 30 mL  30 mL Oral Q4H PRN Laveda Abbe, NP      . cephALEXin [facility]) capsule 500 mg  500 mg Oral Q8H Laveda Abbe, NP   500 mg at 10/24/16 1312  . escitalopram (LEXAPRO) tablet 10 mg  10 mg Oral Daily Jomarie Longs, Guerrero   10 mg at 10/24/16 0839  . gabapentin (NEURONTIN) capsule 200 mg  200 mg Oral BID Laveda Abbe, NP   200 mg at 10/24/16 9562  . hydrOXYzine (ATARAX/VISTARIL) tablet 25 mg  25 mg Oral Q6H PRN Laveda Abbe, NP      .  magnesium hydroxide (MILK OF MAGNESIA) suspension 30 mL  30 mL Oral Daily PRN Laveda Abbe, NP      . nicotine (NICODERM CQ - dosed in mg/24 hours) patch 21 mg  21 mg Transdermal Q0600 Donell Sievert E, PA-C   21 mg at 10/24/16 0600  . [START ON 11/20/2016] paliperidone (INVEGA SUSTENNA) injection 117 mg  117 mg Intramuscular Q28 days Jomarie Longs, Guerrero      . Melene Muller ON 10/28/2016] paliperidone (INVEGA SUSTENNA) injection 156 mg  156 mg Intramuscular Once Eappen, Saramma, Guerrero      . paliperidone (INVEGA) 24 hr tablet 6 mg  6 mg Oral Daily Laveda Abbe, NP   6 mg at 10/24/16 0839  . traZODone (DESYREL) tablet 150 mg  150 mg Oral QHS,MR X 1 Kerry Hough, PA-C   150 mg at 10/23/16 2348  . trihexyphenidyl (ARTANE) tablet 5 mg  5 mg Oral TID WC Laveda Abbe, NP   5 mg at 10/24/16 1312   Lab Results:  Results for orders placed or performed during the hospital encounter of 10/22/16 (from the past 48 hour(s))  CBC with Differential/Platelet     Status: Abnormal   Collection Time: 10/24/16  6:21 AM  Result Value Ref Range   WBC 6.9 4.0 - 10.5 K/uL   RBC 4.68 4.22 - 5.81 MIL/uL   Hemoglobin 14.8 13.0 - 17.0 g/dL   HCT 13.0 86.5 - 78.4 %   MCV 90.8 78.0 - 100.0 fL   MCH 31.6 26.0 - 34.0 pg   MCHC 34.8 30.0 - 36.0 g/dL   RDW 69.6 29.5 - 28.4 %   Platelets 122 (L) 150 - 400 K/uL   Neutrophils Relative % 42 %   Neutro Abs 2.9 1.7 - 7.7 K/uL   Lymphocytes Relative 45 %   Lymphs Abs 3.1 0.7 - 4.0 K/uL   Monocytes Relative 10 %   Monocytes Absolute 0.7 0.1 - 1.0 K/uL   Eosinophils Relative 3 %   Eosinophils Absolute 0.2 0.0 - 0.7 K/uL   Basophils Relative 0 %   Basophils Absolute 0.0 0.0 - 0.1 K/uL    Comment: Performed at [facility], [address] [place], [place] [address]   Blood Alcohol level:  Lab Results  Component Value Date   ETH 6 (H) 10/20/2016   ETH 16 (H) 06/07/2016   Metabolic Disorder Labs: Lab Results  Component Value Date    HGBA1C 5.1 06/18/2016   MPG 100 06/18/2016   Lab Results  Component Value Date   PROLACTIN 28.5 (H) 06/18/2016   Lab Results  Component Value Date   CHOL 213 (H) 06/18/2016   TRIG 181 (H) 06/18/2016   HDL 35 (L) 06/18/2016  CHOLHDL 6.1 06/18/2016   VLDL 36 06/18/2016   LDLCALC 142 (H) 06/18/2016   Physical Findings: AIMS: Facial and Oral Movements Muscles of Facial Expression: None, normal Lips and Perioral Area: None, normal Jaw: None, normal Tongue: None, normal,Extremity Movements Upper (arms, wrists, hands, fingers): None, normal Lower (legs, knees, ankles, toes): None, normal, Trunk Movements Neck, shoulders, hips: None, normal, Overall Severity Severity of abnormal movements (highest score from questions above): None, normal Incapacitation due to abnormal movements: None, normal Patient's awareness of abnormal movements (rate only patient's report): No Awareness, Dental Status Current problems with teeth and/or dentures?: No Does patient usually wear dentures?: Yes  CIWA:    COWS:     Musculoskeletal: Strength & Muscle Tone: within normal limits Gait & Station: normal Patient leans: N/A  Psychiatric Specialty Exam: Physical Exam: Nurse notes & Vital signs revealed.  Review of Systems  All other systems reviewed and are negative.   Blood pressure (!) 130/97, pulse (!) 113, temperature (!) 97.3 F (36.3 C), temperature source Oral, resp. rate 20, height 5' 11.5" (1.816 m), weight 91.6 kg (202 lb), SpO2 97 %.Body mass index is 27.78 kg/m.  General Appearance: Guarded  Eye Contact:  Fair  Speech:  Normal Rate  Volume:  Normal  Mood:  Anxious and Depressed  Affect:  Congruent  Thought Process:  Goal Directed and Descriptions of Associations: Circumstantial  Orientation:  Full (Time, Place, and Person)  Thought Content:  Delusions, Hallucinations: Auditory Command:  kill self and others, Paranoid Ideation and Rumination  Suicidal Thoughts:  Yes.  with  intent/plan, s/p suicide attempt, contracts for safety on unit  Homicidal Thoughts:  No  Memory:  Immediate;   Fair Recent;   Fair Remote;   Fair  Judgement:  Impaired  Insight:  Shallow  Psychomotor Activity:  Normal  Concentration:  Concentration: Fair and Attention Span: Fair  Recall:  [employer] of Knowledge:  Fair  Language:  Fair  Akathisia:  No  Handed:  Right  AIMS (if indicated):     Assets:  Communication Skills Desire for Improvement  ADL's:  Intact  Cognition:  WNL  Sleep:  Number of Hours: 4.75     Treatment Plan Summary: Daily contact with patient to assess and evaluate symptoms and progress in treatment.  Will continue today 10/24/16 plan as below except where it is noted. -Continue Lexapro 10 mg daily for depression. -Gabapentin 200 mg twice daily for agitation. -ContinuePaliperidone Hinda Glatter Lorelei Pont) injection 117 mg Q 28 days (due 11-20-16) for mood control. -Continue Paliperidone Hinda Glatter Sustenna) 234 mg IM due 10-23-16 for mood control. -Continue Paliperidone (Invega) 6 mg daily for mood control -Continue Hydroxyzine 25 mg prn for anxiety Q 6 hours. -Continue Artane 5 mg tid for EPS. -Increased Trazodone from 150 mg to 200 mg Q hs prn for insomnia, may repeat x 1. -Continue Keflex 500 mg for infection x 15 doses. _Encourage group participation. -Child psychotherapist to continue to work on the disposition plan.  Sanjuana Kava, NP, PMHNP, FNP-BC. 10/24/2016, 4:25 PM

## 2016-10-25 LAB — HEPATITIS PANEL, ACUTE
HEP A IGM: NEGATIVE
HEP B C IGM: NEGATIVE
Hepatitis B Surface Ag: NEGATIVE

## 2016-10-25 NOTE — Progress Notes (Signed)
DAR NOTE: Patient presents with flat affect and depressed mood.  Denies pain, auditory and visual hallucinations.  Rates depression at 5, hopelessness at 8, and anxiety at 5.  Reports withdrawal symptoms of tremors, chilling, and runny nose on self inventory form.  Maintained on routine safety checks.  Medications given as prescribed.  Support and encouragement offered as needed.  States goal for today is "getting out of here."  Patient is withdrawn and isolative to his room most of this shift.  Minimal interaction with staff.   Offered no complaint.

## 2016-10-25 NOTE — [facility] Group Notes (Signed)
LCSW Group Therapy 10/25/2016 1:15pm  Type of Therapy and Topic:  Group Therapy:  Change and Accountability  Participation Level:  Did Not Attend  Description of Group In this group, patients discussed power and accountability for change.  The group identified the challenges related to accountability and the difficulty of accepting the outcomes of negative behaviors.  Patients were encouraged to openly discuss a challenge/change they could take responsibility for.  Patients discussed the use of "change talk" and positive thinking as ways to support achievement of personal goals.  The group discussed ways to give support and empowerment to peers.  Therapeutic Goals: 1. Patients will state the relationship between personal power and accountability in the change process 2. Patients will identify the positive and negative consequences of a personal choice they have made 3. Patients will identify one challenge/choice they will take responsibility for making 4. Patients will discuss the role of "change talk" and the impact of positive thinking as it supports successful personal change 5. Patients will verbalize support and affirmation of change efforts in peers  Summary of Patient Progress:    Therapeutic Modalities Solution Focused Brief Therapy Motivational Interviewing Cognitive Behavioral Therapy  Haik Mahoney M Angelette Ganus, Student-Social Work 10/25/2016 1:59 PM  

## 2016-10-25 NOTE — Progress Notes (Signed)
Recreation Therapy Notes  Date: 10/25/16 Time: 1000 Location: 500 Hall  Group Topic: Communication, Team Building, Problem Solving  Goal Area(s) Addresses:  Patient will effectively work with peer towards shared goal.  Patient will identify skill used to make activity successful.  Patient will identify how skills used during activity can be used to reach post d/c goals.   Intervention: Round rubber disk  Activity: Crossover.  As a group, patients were given one more round disk than the number of participants in group.  Patients were to use the disk to maneuver all the patients plus the disks to the designated end point and back to the starting line.  If any participants foot touched the floor, the group would have to start from the beginning.   Education: Pharmacist, community, Building control surveyor.   Education Outcome: Acknowledges education/In group clarification offered/Needs additional education.   Clinical Observations/Feedback:  Pt did not attend group.    Caroll Rancher, LRT/CTRS         Caroll Rancher A 10/25/2016 12:39 PM

## 2016-10-25 NOTE — Progress Notes (Signed)
Recreation Therapy Notes  10/25/16 1144:  On 10/24/16, LRT attempted to conduct assessment with pt.  Pt would not respond to LRT.     Caroll Rancher, LRT/CTRS     Caroll Rancher A 10/25/2016 11:43 AM

## 2016-10-25 NOTE — Progress Notes (Signed)
[facility] MD Progress Note  10/25/2016 2:38 PM Leonard Guerrero  MRN:  [id number]  Subjective: Leonard Guerrero reports, "I'm doing a lot better now that I'm on my medicines. I feel alright today. I feel like I'm ready to go home. I still hear & talk to the voices & I see flashes of light. I believe that that is always going to be there. I have been dealing with this for a long time".  Objective: Leonard Guerrero is a 41 y old CM, who is single , has a hx of schizophrenia as well as polysubstance abuse ,homeless, noncompliant with medications presented to [facility] s/p suicide attempt by cutting. Leonard Guerrero is seen, chart reviewed. Discussed this case with the treatment team. Leonard Guerrero is lying down in his bed sleeping. He says he did sleep well last night. Howveer, he did say that he did not attend group sessions so far today. The dressing to the self-inflicted laceration to his left wrist intact.  He currently denies any problems. No disruptive behavior on the unit. Patient does not appear to be responding to any internal stimuli. He is taking & tolerating his treatment regimen. Denies any side effects. He wants to be discharged to his home.  Staff continue to provide needed support.  Principal Problem: Schizophrenia (HCC)  Diagnosis:   Patient Active Problem List   Diagnosis Date Noted  . Schizophrenia (HCC) [F20.9] 10/23/2016  . MDD (major depressive disorder), recurrent, severe, with psychosis (HCC) [F33.3] 10/23/2016  . Elevated TSH [R94.6] 06/18/2016  . Paranoid schizophrenia (HCC) [F20.0] 06/17/2016  . Major depressive disorder with single episode [F32.9] 06/17/2016  . Alcohol use disorder, severe, dependence (HCC) [F10.20] 06/17/2016  . Cocaine use disorder, moderate, dependence (HCC) [F14.20] 06/17/2016  . Alcoholic intoxication without complication (HCC) [F10.920]   . Acute encephalopathy [G93.40] 06/08/2016  . Acute respiratory failure (HCC) [J96.00]   . Altered mental status [R41.82]   . Encephalopathy acute [G93.40]  06/07/2016  . Chest pain [R07.9] 04/04/2015   Total Time spent with patient: 15 minutes  Past Psychiatric History: Schizoaffective disorder.  Past Medical History:  Past Medical History:  Diagnosis Date  . Bipolar 1 disorder (HCC)   . ETOH abuse   . Hypertension   . Paranoid schizophrenia [facility])     Past Surgical History:  Procedure Laterality Date  . DENTAL SURGERY     complete extraction   Family History:  Family History  Problem Relation Age of Onset  . Mental illness Father   . Heart attack Father   . Hypertension Mother   . Mental illness Sister    Family Psychiatric  History: See H&P.  Social History:  History  Alcohol Use  . 1.8 - 2.4 oz/week  . 3 - 4 Cans of beer per week     History  Drug use: Unknown    Social History   Social History  . Marital status: Unknown    Spouse name: N/A  . Number of children: N/A  . Years of education: N/A   Social History Main Topics  . Smoking status: Current Every Day Smoker    Packs/day: 1.00    Years: 10.00    Types: Cigarettes  . Smokeless tobacco: Never Used  . Alcohol use 1.8 - 2.4 oz/week    3 - 4 Cans of beer per week  . Drug use: Unknown  . Sexual activity: Not Asked   Other Topics Concern  . None   Social History Narrative   ** Merged History Encounter **  Additional Social History:   Sleep: Good  Appetite:  Fair  Current Medications: Current Facility-Administered Medications  Medication Dose Route Frequency Provider Last Rate Last Dose  . acetaminophen (TYLENOL) tablet 650 mg  650 mg Oral Q6H PRN Laveda Abbe, NP      . alum & mag hydroxide-simeth (MAALOX/MYLANTA) 200-200-20 MG/5ML suspension 30 mL  30 mL Oral Q4H PRN Laveda Abbe, NP      . cephALEXin [facility]) capsule 500 mg  500 mg Oral Q8H Laveda Abbe, NP   500 mg at 10/25/16 1342  . escitalopram (LEXAPRO) tablet 10 mg  10 mg Oral Daily Jomarie Longs, MD   10 mg at 10/25/16 0843  . gabapentin  (NEURONTIN) capsule 200 mg  200 mg Oral BID Laveda Abbe, NP   200 mg at 10/25/16 1610  . hydrOXYzine (ATARAX/VISTARIL) tablet 25 mg  25 mg Oral Q6H PRN Laveda Abbe, NP      . magnesium hydroxide (MILK OF MAGNESIA) suspension 30 mL  30 mL Oral Daily PRN Laveda Abbe, NP      . nicotine (NICODERM CQ - dosed in mg/24 hours) patch 21 mg  21 mg Transdermal Q0600 Kerry Hough, PA-C   21 mg at 10/25/16 0844  . [START ON 11/20/2016] paliperidone (INVEGA SUSTENNA) injection 117 mg  117 mg Intramuscular Q28 days Jomarie Longs, MD      . Melene Muller ON 10/28/2016] paliperidone (INVEGA SUSTENNA) injection 156 mg  156 mg Intramuscular Once Eappen, Saramma, MD      . paliperidone (INVEGA) 24 hr tablet 6 mg  6 mg Oral Daily Laveda Abbe, NP   6 mg at 10/25/16 0843  . traZODone (DESYREL) tablet 200 mg  200 mg Oral QHS,MR X 1 Marieta Markov I, NP   200 mg at 10/24/16 2232  . trihexyphenidyl (ARTANE) tablet 5 mg  5 mg Oral TID WC Laveda Abbe, NP   5 mg at 10/25/16 1130   Lab Results:  Results for orders placed or performed during the hospital encounter of 10/22/16 (from the past 48 hour(s))  Hepatitis panel, acute     Status: None   Collection Time: 10/24/16  6:21 AM  Result Value Ref Range   Hepatitis B Surface Ag Negative Negative   HCV Ab <0.1 0.0 - 0.9 s/co ratio    Comment: (NOTE)                                  Negative:     < 0.8                             Indeterminate: 0.8 - 0.9                                  Positive:     > 0.9 The CDC recommends that a positive HCV antibody result be followed up with a HCV Nucleic Acid Amplification test (960454). Performed At: [facility] [address] [place], [place] [id number] Mila Homer MD WG:[id number]    Hep A IgM Negative Negative   Hep B C IgM Negative Negative    Comment: Performed at [facility], [address] [place], [place] [address]  CBC with  Differential/Platelet     Status: Abnormal  Collection Time: 10/24/16  6:21 AM  Result Value Ref Range   WBC 6.9 4.0 - 10.5 K/uL   RBC 4.68 4.22 - 5.81 MIL/uL   Hemoglobin 14.8 13.0 - 17.0 g/dL   HCT 40.9 81.1 - 91.4 %   MCV 90.8 78.0 - 100.0 fL   MCH 31.6 26.0 - 34.0 pg   MCHC 34.8 30.0 - 36.0 g/dL   RDW 78.2 95.6 - 21.3 %   Platelets 122 (L) 150 - 400 K/uL   Neutrophils Relative % 42 %   Neutro Abs 2.9 1.7 - 7.7 K/uL   Lymphocytes Relative 45 %   Lymphs Abs 3.1 0.7 - 4.0 K/uL   Monocytes Relative 10 %   Monocytes Absolute 0.7 0.1 - 1.0 K/uL   Eosinophils Relative 3 %   Eosinophils Absolute 0.2 0.0 - 0.7 K/uL   Basophils Relative 0 %   Basophils Absolute 0.0 0.0 - 0.1 K/uL    Comment: Performed at [facility], [address] [place], [place] [address]   Blood Alcohol level:  Lab Results  Component Value Date   ETH 6 (H) 10/20/2016   ETH 16 (H) 06/07/2016   Metabolic Disorder Labs: Lab Results  Component Value Date   HGBA1C 5.1 06/18/2016   MPG 100 06/18/2016   Lab Results  Component Value Date   PROLACTIN 28.5 (H) 06/18/2016   Lab Results  Component Value Date   CHOL 213 (H) 06/18/2016   TRIG 181 (H) 06/18/2016   HDL 35 (L) 06/18/2016   CHOLHDL 6.1 06/18/2016   VLDL 36 06/18/2016   LDLCALC 142 (H) 06/18/2016   Physical Findings: AIMS: Facial and Oral Movements Muscles of Facial Expression: None, normal Lips and Perioral Area: None, normal Jaw: None, normal Tongue: None, normal,Extremity Movements Upper (arms, wrists, hands, fingers): None, normal Lower (legs, knees, ankles, toes): None, normal, Trunk Movements Neck, shoulders, hips: None, normal, Overall Severity Severity of abnormal movements (highest score from questions above): None, normal Incapacitation due to abnormal movements: None, normal Patient's awareness of abnormal movements (rate only patient's report): No Awareness, Dental Status Current problems with teeth and/or  dentures?: No Does patient usually wear dentures?: Yes  CIWA:    COWS:     Musculoskeletal: Strength & Muscle Tone: within normal limits Gait & Station: normal Patient leans: N/A  Psychiatric Specialty Exam: Physical Exam: Nurse notes & Vital signs revealed.  Review of Systems  All other systems reviewed and are negative.   Blood pressure 109/67, pulse (!) 103, temperature 98 F (36.7 C), temperature source Oral, resp. rate 18, height 5' 11.5" (1.816 m), weight 91.6 kg (202 lb), SpO2 97 %.Body mass index is 27.78 kg/m.  General Appearance: Casual, Guarded  Eye Contact:  Fair  Speech:  Normal Rate  Volume:  Normal  Mood:  "My mood is improving"  Affect:  Congruent  Thought Process:  Goal Directed and Descriptions of Associations: Circumstantial  Orientation:  Full (Time, Place, and Person)  Thought Content:  Delusions, Hallucinations: Auditory Command:  kill self and others, Paranoid Ideation and Rumination  Suicidal Thoughts:  Denies any thoughts, plans or intent today, s/p suicide attempt, contracts for safety on unit  Homicidal Thoughts:  Denies.  Memory:  Immediate;   Fair Recent;   Fair Remote;   Fair  Judgement:  Impaired  Insight:  Shallow  Psychomotor Activity:  Normal  Concentration:  Concentration: Fair and Attention Span: Fair  Recall:  [employer] of Knowledge:  Fair  Language:  Fair  Akathisia:  No  Handed:  Right  AIMS (if indicated):     Assets:  Communication Skills Desire for Improvement  ADL's:  Intact  Cognition:  WNL  Sleep:  Number of Hours: 6.75     Treatment Plan Summary: Daily contact with patient to assess and evaluate symptoms and progress in treatment.  Will continue today 10/25/16 plan as below except where it is noted. -Continue Lexapro 10 mg daily for depression. -Gabapentin 200 mg twice daily for agitation. -ContinuePaliperidone Hinda Glatter Lorelei Pont) injection 117 mg Q 28 days (due 11-20-16) for mood control. -Continue Paliperidone  Hinda Glatter Sustenna) 234 mg IM due 10-23-16 for mood control. -Continue Paliperidone (Invega) 6 mg daily for mood control -Continue Hydroxyzine 25 mg prn for anxiety Q 6 hours. -Continue Artane 5 mg tid for EPS. -Continue Trazodone 200 mg Q hs for insomnia. -Continue Keflex 500 mg for infection x 15 doses. _Encourage group participation. -Child psychotherapist to continue to work on the disposition plan.  Sanjuana Kava, NP, PMHNP, FNP-BC. 10/25/2016, 2:38 PMPatient ID: Leonard Guerrero, male   DOB: 06-Apr-1975, 41 y.o.   MRN: [id number]

## 2016-10-25 NOTE — Progress Notes (Signed)
[facility] Group Notes:  (Nursing/MHT/Case Management/Adjunct)  Date:  10/25/2016  Time:  8:54 PM  Type of Therapy:  Psychoeducational Skills  Participation Level:  Minimal  Participation Quality:  Attentive  Affect:  Appropriate  Cognitive:  Appropriate  Insight:  Good  Engagement in Group:  Developing/Improving  Modes of Intervention:  Education  Summary of Progress/Problems: Patient states that he slept all day and had nothing else to share with regards to his day. In terms of the theme for the day, his relapse prevention will include quitting drinking.   Hazle Coca S 10/25/2016, 8:54 PM

## 2016-10-26 MED ORDER — ESCITALOPRAM OXALATE 5 MG PO TABS
15.0000 mg | ORAL_TABLET | Freq: Every day | ORAL | Status: DC
  Administered 2016-10-27 – 2016-10-29 (×3): 15 mg via ORAL
  Filled 2016-10-26 (×3): qty 3
  Filled 2016-10-26: qty 21
  Filled 2016-10-26: qty 3

## 2016-10-26 NOTE — Progress Notes (Signed)
Patient isolative with minimal interaction with peers. Attended groups and is compliant with medications. Expressed concern about arm bandage, RN to remove, assess and replace if indicated. Depressed, anxious affect.

## 2016-10-26 NOTE — Progress Notes (Signed)
[name] MD Progress Note  10/26/2016 2:37 PM Leonard Guerrero  MRN:  [id number]  Subjective:Patient states " I am better.'  Objective:Patient seen and chart reviewed.Discussed patient with treatment team.  Pt today seen in bed , reports his sx are improving. However , he continues to have a depressed affect as well as is often noted as isolative. Tolerating medications so far. Will continue to suppport.      Principal Problem: Schizophrenia (HCC)  Diagnosis:   Patient Active Problem List   Diagnosis Date Noted  . Schizophrenia (HCC) [F20.9] 10/23/2016  . MDD (major depressive disorder), recurrent, severe, with psychosis (HCC) [F33.3] 10/23/2016  . Elevated TSH [R94.6] 06/18/2016  . Paranoid schizophrenia (HCC) [F20.0] 06/17/2016  . Major depressive disorder with single episode [F32.9] 06/17/2016  . Alcohol use disorder, severe, dependence (HCC) [F10.20] 06/17/2016  . Cocaine use disorder, moderate, dependence (HCC) [F14.20] 06/17/2016  . Alcoholic intoxication without complication (HCC) [F10.920]   . Acute encephalopathy [G93.40] 06/08/2016  . Acute respiratory failure (HCC) [J96.00]   . Altered mental status [R41.82]   . Encephalopathy acute [G93.40] 06/07/2016  . Chest pain [R07.9] 04/04/2015   Total Time spent with patient: 20 minutes  Past Psychiatric History: Please see H&P.   Past Medical History:  Past Medical History:  Diagnosis Date  . Bipolar 1 disorder (HCC)   . ETOH abuse   . Hypertension   . Paranoid schizophrenia [facility])     Past Surgical History:  Procedure Laterality Date  . DENTAL SURGERY     complete extraction   Family History:  Family History  Problem Relation Age of Onset  . Mental illness Father   . Heart attack Father   . Hypertension Mother   . Mental illness Sister    Family Psychiatric  History: Please see H&P.   Social History:  History  Alcohol Use  . 1.8 - 2.4 oz/week  . 3 - 4 Cans of beer per week     History  Drug use: Unknown     Social History   Social History  . Marital status: Unknown    Spouse name: N/A  . Number of children: N/A  . Years of education: N/A   Social History Main Topics  . Smoking status: Current Every Day Smoker    Packs/day: 1.00    Years: 10.00    Types: Cigarettes  . Smokeless tobacco: Never Used  . Alcohol use 1.8 - 2.4 oz/week    3 - 4 Cans of beer per week  . Drug use: Unknown  . Sexual activity: Not Asked   Other Topics Concern  . None   Social History Narrative   ** Merged History Encounter **       Additional Social History:   Sleep: Fair  Appetite:  Fair  Current Medications: Current Facility-Administered Medications  Medication Dose Route Frequency Provider Last Rate Last Dose  . acetaminophen (TYLENOL) tablet 650 mg  650 mg Oral Q6H PRN Laveda Abbe, NP      . alum & mag hydroxide-simeth (MAALOX/MYLANTA) 200-200-20 MG/5ML suspension 30 mL  30 mL Oral Q4H PRN Laveda Abbe, NP      . cephALEXin [facility]) capsule 500 mg  500 mg Oral Q8H Laveda Abbe, NP   500 mg at 10/26/16 1332  . [START ON 10/27/2016] escitalopram (LEXAPRO) tablet 15 mg  15 mg Oral Daily Lelani Garnett, MD      . gabapentin (NEURONTIN) capsule 200 mg  200 mg Oral BID Elta Guadeloupe  Micheline Rough, NP   200 mg at 10/26/16 0744  . hydrOXYzine (ATARAX/VISTARIL) tablet 25 mg  25 mg Oral Q6H PRN Laveda Abbe, NP      . magnesium hydroxide (MILK OF MAGNESIA) suspension 30 mL  30 mL Oral Daily PRN Laveda Abbe, NP      . nicotine (NICODERM CQ - dosed in mg/24 hours) patch 21 mg  21 mg Transdermal Q0600 Kerry Hough, PA-C   21 mg at 10/26/16 0745  . [START ON 11/20/2016] paliperidone (INVEGA SUSTENNA) injection 117 mg  117 mg Intramuscular Q28 days Jomarie Longs, MD      . Melene Muller ON 10/28/2016] paliperidone (INVEGA SUSTENNA) injection 156 mg  156 mg Intramuscular Once Janyla Biscoe, MD      . paliperidone (INVEGA) 24 hr tablet 6 mg  6 mg Oral Daily Laveda Abbe, NP   6 mg at 10/26/16 0744  . traZODone (DESYREL) tablet 200 mg  200 mg Oral QHS,MR X 1 Nwoko, Agnes I, NP   200 mg at 10/25/16 2200  . trihexyphenidyl (ARTANE) tablet 5 mg  5 mg Oral TID WC Laveda Abbe, NP   5 mg at 10/26/16 1253   Lab Results:  No results found for this or any previous visit (from the past 48 hour(s)). Blood Alcohol level:  Lab Results  Component Value Date   ETH 6 (H) 10/20/2016   ETH 16 (H) 06/07/2016   Metabolic Disorder Labs: Lab Results  Component Value Date   HGBA1C 5.1 06/18/2016   MPG 100 06/18/2016   Lab Results  Component Value Date   PROLACTIN 28.5 (H) 06/18/2016   Lab Results  Component Value Date   CHOL 213 (H) 06/18/2016   TRIG 181 (H) 06/18/2016   HDL 35 (L) 06/18/2016   CHOLHDL 6.1 06/18/2016   VLDL 36 06/18/2016   LDLCALC 142 (H) 06/18/2016   Physical Findings: AIMS: Facial and Oral Movements Muscles of Facial Expression: None, normal Lips and Perioral Area: None, normal Jaw: None, normal Tongue: None, normal,Extremity Movements Upper (arms, wrists, hands, fingers): None, normal Lower (legs, knees, ankles, toes): None, normal, Trunk Movements Neck, shoulders, hips: None, normal, Overall Severity Severity of abnormal movements (highest score from questions above): None, normal Incapacitation due to abnormal movements: None, normal Patient's awareness of abnormal movements (rate only patient's report): No Awareness, Dental Status Current problems with teeth and/or dentures?: No Does patient usually wear dentures?: Yes  CIWA:    COWS:     Musculoskeletal: Strength & Muscle Tone: within normal limits Gait & Station: normal Patient leans: N/A  Psychiatric Specialty Exam: Physical Exam  Nursing note and vitals reviewed.   Review of Systems  Psychiatric/Behavioral: Positive for depression and hallucinations. The patient is nervous/anxious.   All other systems reviewed and are negative.   Blood pressure  109/67, pulse (!) 103, temperature 98 F (36.7 C), temperature source Oral, resp. rate 18, height 5' 11.5" (1.816 m), weight 91.6 kg (202 lb), SpO2 97 %.Body mass index is 27.78 kg/m.  General Appearance:  casual  Eye Contact:   fair  Speech:   normal rate  Volume:   reduced  Mood:   improving  Affect:  depressed  Thought Process:   goal directed and Descriptions of Associations: linear  Orientation:   self, situation  Thought Content:   delusions , Hallucinations: AH - command to kill self and others - improving    Suicidal Thoughts: denies s/p suicide attempt  Homicidal Thoughts: denies  Memory:  Immediate;    fair Recent;    fair Remote;   fair  Judgement:  impaired  Insight:  shallow  Psychomotor Activity:  normal  Concentration:   fair, Attention Span:fair  Recall:  fair  Fund of Knowledge:   fair  Language:  fair  Akathisia:  no  Handed:   right  AIMS (if indicated):     Assets:  access to healthcare  ADL's:  fair  Cognition: wnl  Sleep:  Number of Hours: 6.25 hrs      Will continue today 10/26/16  plan as below except where it is noted.     Treatment Plan Summary:Patient with schizophrenia , as well as polysubstance abuse , presented with worsening depressive sx , S/P suicide attempt by cutting wrist , making partial progress , will continue treatment.  Daily contact with patient to assess and evaluate symptoms and progress in treatment, Medication management and Plan see below.   -Increase Lexapro to 15 mg po daily for affective sx. -Gabapentin 200 mg twice daily for agitation. -ContinuePaliperidone Hinda Glatter Lorelei Pont) injection 117 mg Q 28 days (due 11-20-16) for mood control. -Continue Paliperidone Hinda Glatter Sustenna) 234 mg IM due 10-23-16 . 156 mg IM on 10/28/2016. -Continue Paliperidone (Invega) 6 mg daily for mood control -Continue Hydroxyzine 25 mg prn for anxiety Q 6 hours. -Continue Artane 5 mg tid for EPS. -Continue Trazodone 200 mg Q hs for  insomnia. -Continue Keflex 500 mg for infection x 15 doses. _Encourage group participation. -Child psychotherapist to continue to work on the disposition plan.  Jomarie Longs, MD 10/26/2016, 2:37 PMPatient ID: Leonard Guerrero, male   DOB: 1975/12/13, 41 y.o.   MRN: [id number]

## 2016-10-26 NOTE — Progress Notes (Addendum)
D: Pt denies SI/HI/AVH. Pt is pleasant and cooperative. Pt appears to be responding and appears paranoid . Pt keeps to himself and stays in his room a lot.  A: Pt was offered support and encouragement. Pt was given scheduled medications. Pt was encourage to attend groups. Q 15 minute checks were done for safety.   R: safety maintained on unit.

## 2016-10-26 NOTE — Progress Notes (Signed)
Adult Psychoeducational Group Note  Date:  10/26/2016 Time:  11:18 PM  Group Topic/Focus:  Wrap-Up Group:   The focus of this group is to help patients review their daily goal of treatment and discuss progress on daily workbooks.  Participation Level:  Did Not Attend  Participation Quality:  Did not attend  Affect:  Did not attend  Cognitive:  Did not attend  Insight: None  Engagement in Group:  Did not attend  Modes of Intervention:  Did not attend  Additional Comments:  Pt did not attend evening wrap up group tonight.  Felipa Furnace 10/26/2016, 11:18 PM

## 2016-10-26 NOTE — [facility] Group Notes (Signed)
[facility] LCSW Group Therapy Note  10/26/2016  11:15 to 11:50 AM  Type of Therapy and Topic:  Group Therapy: Avoiding Self-Sabotaging and Enabling Behaviors  Participation Level:  Minimal   Description of Group The main focus of today's process group to discuss what "self-sabotage" means and use motivational iInterviewing to discuss what benefits, negative or positive, were involved in a self-identified self-sabotaging behavior. We then talked about reasons the patient may want to change the behavior and their current desire to change.   Summary of Patient Progress: Patient shared during group warmup that he is looking forward to Christmas family trip yet appeared flat and was quiet for the remainder of group before leaving early.    Therapeutic molalities: Cognitive Behavioral Therapy Person-Centered Therapy Motivational Interviewing  Therapeutic Goals: 1. Patients will demonstrate understanding of the concept of self sabotage 2. Patients will be able to identify pros and cons of their behaviors 3. Patients will be able to identify at least two motivating factors for l of their desire for change   Carney Bern, LCSW

## 2016-10-26 NOTE — Plan of Care (Signed)
Problem: Activity: Goal: Interest or engagement in activities will improve Outcome: Progressing Patient is visible in the milieu this evening and attended group.

## 2016-10-26 NOTE — Plan of Care (Signed)
Problem: Safety: Goal: Periods of time without injury will increase Outcome: Progressing Pt safe on the unit at this time   

## 2016-10-26 NOTE — Progress Notes (Signed)
Nursing Progress Note 1900-0730  D) Patient presents with flat affect and depressed mood. Patient was minimal with Clinical research associate but seen interactive in the milieu. Patient denies SI/HI or pain but reports auditory hallucinations. Patient contracts for safety on the unit. Patient reports sleeping well with current regimen. Patient compliant with medications.  A) Emotional support given. 1:1 interaction and active listening provided. Patient medicated as prescribed. Medications and plan of care reviewed with patient. Patient verbalized understanding without further questions. Snacks and fluids provided. Opportunities for questions or concerns presented to patient. Patient encouraged to continue to work on treatment goals. Labs, vital signs and patient behavior monitored throughout shift. Patient safety maintained with q15 min safety checks. Low fall risk precautions in place and reviewed with patient; patient verbalized understanding.  R) Patient receptive to interaction with nurse. Patient remains safe on the unit at this time. Patient denies any adverse medication reactions at this time. Patient is resting in bed without complaints. Will continue to monitor.

## 2016-10-27 DIAGNOSIS — R443 Hallucinations, unspecified: Secondary | ICD-10-CM

## 2016-10-27 DIAGNOSIS — G4700 Insomnia, unspecified: Secondary | ICD-10-CM

## 2016-10-27 NOTE — [facility] Group Notes (Signed)
[facility] LCSW Group Therapy Note    10/27/2016 at 11:30 until Noon  Type of Therapy and Topic: Group Therapy: Feelings Around Returning Home & Establishing a Supportive Framework and How to Practice Self care in Daily Life  Participation Level: Minimal   Description of Group:  Patients first processed thoughts and feelings about up coming discharge. These included fears of upcoming changes, lack of change, new living environments, judgements and expectations from others and overall stigma of MH issues. We then discussed what is a supportive framework? What does it look like feel like and how do I discern it from and unhealthy non-supportive network? Learn how to cope when supports are not helpful and don't support you. Discuss what to do when your family/friends are not supportive.   Therapeutic Goals Addressed in Processing Group:  1. Patient will identify one healthy supportive network that they can use at discharge. 2. Patient will identify one factor of a supportive framework and how to tell it from an unhealthy network. 3. Patient able to identify one coping skill to use when they do not have positive supports from others. 4. Patient will demonstrate ability to communicate their needs through discussion and/or role plays.  Summary of Patient Progress:  Pt engaged minimally during group session. He appeared flat with blunt affect when he shared difficulty avoiding alcohol. "If I see it I want it" and he associates alcohol with negative consequences.    Carney Bern, LCSW

## 2016-10-27 NOTE — Progress Notes (Signed)
D: Patient's self inventory sheet: patient has good sleep, received sleep medication.fair  Appetite, normal energy level, good concentration. Rated depression 5/10, hopeless 3/10, anxiety 3/10. SI/HI/AVH: denies all. Physical complaints are denied. Goal is "geting out". Patient pleasant on approach, states that he continues to have some paranoia when people are behind him. Mother visited last night and was crying a lot. She has arranged for him to live locally with a friend if he agrees to pay rent. A: Medications administered, assessed medication knowledge and education given on medication regimen.  Emotional support and encouragement given patient. R: Denies SI and HI , contracts for safety. Safety maintained with 15 minute checks.

## 2016-10-27 NOTE — Progress Notes (Signed)
[facility] MD Progress Note  10/27/2016 9:30 AM Leonard Guerrero  MRN:  [id number] Subjective: Leonard Guerrero reports "feeling better today"  Objective: Leonard Guerrero is awake, alert and oriented. Seen resting in bed. Reports previous suicide attempt 6 about 6 months ago, however is feeling much better today. Denies suicidal or homicidal ideation. Denies auditory or visual hallucination, however patient seen responding to internal stimuli  and pacing the unit. States a history of hearing voices however denies at this time. Reports he is medication compliant without mediation side effects. Patient reports he will follow-up with [facility] after discharge. Support, encouragement and reassurance was provided.   Principal Problem: Schizophrenia (HCC) Diagnosis:   Patient Active Problem List   Diagnosis Date Noted  . Schizophrenia (HCC) [F20.9] 10/23/2016  . MDD (major depressive disorder), recurrent, severe, with psychosis (HCC) [F33.3] 10/23/2016  . Elevated TSH [R94.6] 06/18/2016  . Paranoid schizophrenia (HCC) [F20.0] 06/17/2016  . Major depressive disorder with single episode [F32.9] 06/17/2016  . Alcohol use disorder, severe, dependence (HCC) [F10.20] 06/17/2016  . Cocaine use disorder, moderate, dependence (HCC) [F14.20] 06/17/2016  . Alcoholic intoxication without complication (HCC) [F10.920]   . Acute encephalopathy [G93.40] 06/08/2016  . Acute respiratory failure (HCC) [J96.00]   . Altered mental status [R41.82]   . Encephalopathy acute [G93.40] 06/07/2016  . Chest pain [R07.9] 04/04/2015   Total Time spent with patient: 30 minutes  Past Psychiatric History:   Past Medical History:  Past Medical History:  Diagnosis Date  . Bipolar 1 disorder (HCC)   . ETOH abuse   . Hypertension   . Paranoid schizophrenia [facility])     Past Surgical History:  Procedure Laterality Date  . DENTAL SURGERY     complete extraction   Family History:  Family History  Problem Relation Age of Onset  . Mental illness  Father   . Heart attack Father   . Hypertension Mother   . Mental illness Sister    Family Psychiatric  History:  Social History:  History  Alcohol Use  . 1.8 - 2.4 oz/week  . 3 - 4 Cans of beer per week     History  Drug use: Unknown    Social History   Social History  . Marital status: Unknown    Spouse name: N/A  . Number of children: N/A  . Years of education: N/A   Social History Main Topics  . Smoking status: Current Every Day Smoker    Packs/day: 1.00    Years: 10.00    Types: Cigarettes  . Smokeless tobacco: Never Used  . Alcohol use 1.8 - 2.4 oz/week    3 - 4 Cans of beer per week  . Drug use: Unknown  . Sexual activity: Not Asked   Other Topics Concern  . None   Social History Narrative   ** Merged History Encounter **       Additional Social History:                         Sleep: Fair  Appetite:  Fair  Current Medications: Current Facility-Administered Medications  Medication Dose Route Frequency Provider Last Rate Last Dose  . acetaminophen (TYLENOL) tablet 650 mg  650 mg Oral Q6H PRN Laveda Abbe, NP      . alum & mag hydroxide-simeth (MAALOX/MYLANTA) 200-200-20 MG/5ML suspension 30 mL  30 mL Oral Q4H PRN Laveda Abbe, NP      . cephALEXin [facility]) capsule 500 mg  500 mg Oral  Q8H Laveda Abbe, NP   500 mg at 10/27/16 0615  . escitalopram (LEXAPRO) tablet 15 mg  15 mg Oral Daily Jomarie Longs, MD   15 mg at 10/27/16 0808  . gabapentin (NEURONTIN) capsule 200 mg  200 mg Oral BID Laveda Abbe, NP   200 mg at 10/27/16 1610  . hydrOXYzine (ATARAX/VISTARIL) tablet 25 mg  25 mg Oral Q6H PRN Laveda Abbe, NP      . magnesium hydroxide (MILK OF MAGNESIA) suspension 30 mL  30 mL Oral Daily PRN Laveda Abbe, NP      . nicotine (NICODERM CQ - dosed in mg/24 hours) patch 21 mg  21 mg Transdermal Q0600 Kerry Hough, PA-C   21 mg at 10/27/16 0810  . [START ON 11/20/2016] paliperidone  (INVEGA SUSTENNA) injection 117 mg  117 mg Intramuscular Q28 days Jomarie Longs, MD      . Melene Muller ON 10/28/2016] paliperidone (INVEGA SUSTENNA) injection 156 mg  156 mg Intramuscular Once Eappen, Saramma, MD      . paliperidone (INVEGA) 24 hr tablet 6 mg  6 mg Oral Daily Laveda Abbe, NP   6 mg at 10/27/16 9604  . traZODone (DESYREL) tablet 200 mg  200 mg Oral QHS,MR X 1 Nwoko, Agnes I, NP   200 mg at 10/26/16 2115  . trihexyphenidyl (ARTANE) tablet 5 mg  5 mg Oral TID WC Laveda Abbe, NP   5 mg at 10/27/16 5409    Lab Results: No results found for this or any previous visit (from the past 48 hour(s)).  Blood Alcohol level:  Lab Results  Component Value Date   ETH 6 (H) 10/20/2016   ETH 16 (H) 06/07/2016    Metabolic Disorder Labs: Lab Results  Component Value Date   HGBA1C 5.1 06/18/2016   MPG 100 06/18/2016   Lab Results  Component Value Date   PROLACTIN 28.5 (H) 06/18/2016   Lab Results  Component Value Date   CHOL 213 (H) 06/18/2016   TRIG 181 (H) 06/18/2016   HDL 35 (L) 06/18/2016   CHOLHDL 6.1 06/18/2016   VLDL 36 06/18/2016   LDLCALC 142 (H) 06/18/2016    Physical Findings: AIMS: Facial and Oral Movements Muscles of Facial Expression: None, normal Lips and Perioral Area: None, normal Jaw: None, normal Tongue: None, normal,Extremity Movements Upper (arms, wrists, hands, fingers): None, normal Lower (legs, knees, ankles, toes): None, normal, Trunk Movements Neck, shoulders, hips: None, normal, Overall Severity Severity of abnormal movements (highest score from questions above): None, normal Incapacitation due to abnormal movements: None, normal Patient's awareness of abnormal movements (rate only patient's report): No Awareness, Dental Status Current problems with teeth and/or dentures?: No Does patient usually wear dentures?: Yes  CIWA:    COWS:     Musculoskeletal: Strength & Muscle Tone: within normal limits Gait & Station:  normal Patient leans: N/A  Psychiatric Specialty Exam: Physical Exam  Nursing note and vitals reviewed. Constitutional: He appears well-developed.  Neurological: He is alert.  Psychiatric: He has a normal mood and affect.    ROS  Blood pressure 115/78, pulse 81, temperature 97.8 F (36.6 C), temperature source Oral, resp. rate 16, height 5' 11.5" (1.816 m), weight 91.6 kg (202 lb), SpO2 97 %.Body mass index is 27.78 kg/m.  General Appearance: Casual  Eye Contact:  Fair  Speech:  Clear and Coherent  Volume:  Normal  Mood:  Anxious and Depressed- improving   Affect:  Congruent  Thought Process:  Coherent  and Goal Directed  Orientation:  Full (Time, Place, and Person)  Thought Content:  Hallucinations: Auditory  Suicidal Thoughts:  No denies during this assessment   Homicidal Thoughts:  No  Memory:  Immediate;   Fair Recent;   Fair Remote;   Fair  Judgement:  Fair  Insight:  Present  Psychomotor Activity:  Normal  Concentration:  Concentration: Fair  Recall:  [employer] of Knowledge:  Fair  Language:  Fair  Akathisia:  No  Handed:  Right  AIMS (if indicated):     Assets:  Architect Social Support  ADL's:  Intact  Cognition:  WNL  Sleep:  Number of Hours: 6.75       I agree with current treatment plan on 10/27/2016, Patient seen face-to-face for psychiatric evaluation follow-up, chart reviewed. Reviewed the information documented and agree with the treatment plan.  Treatment Plan Summary: Daily contact with patient to assess and evaluate symptoms and progress in treatment and Medication management   - Continue Lexapro to 15 mg po daily for affective sx. -Gabapentin 200 mg twice daily for agitation. -Continue Paliperidone Hinda Glatter Lorelei Pont) injection 117 mg Q 28 days (due 11-20-16) for mood control. -Continue Paliperidone Hinda Glatter Sustenna) 234 mg IM due 10-23-16 . 156 mg IM on 10/28/2016. -Continue Paliperidone (Invega) 6 mg  daily for mood  control -Continue Hydroxyzine 25 mg prn for anxiety Q 6 hours. -Continue Artane 5 mg tid for EPS. -Continue Trazodone 200 mg Q hs for insomnia. -Continue Keflex 500 mg for infection x 15 doses. -Encourage group participation. -Social worker to continue to work on the disposition plan.   Oneta Rack, NP 10/27/2016, 9:30 AM   Agree with NP Progress Note

## 2016-10-27 NOTE — Progress Notes (Signed)
Nursing Progress Note: 7p-7a D: Pt currently presents with a anxious/sad/pleasant on approach affect and behavior. Pt states "My day was a lot better. I didn't have any hallucinations today. It was good because I was actually able to sleep last night." Interacting minimally with the milieu. Pt reports good sleep during the previous night with current medication regimen. Pt did not attend wrap-up group.  A: Pt provided with medications per providers orders. Pt's labs and vitals were monitored throughout the night. Pt supported emotionally and encouraged to express concerns and questions. Pt educated on medications.  R: Pt's safety ensured with 15 minute and environmental checks. Pt currently denies SI, HI, and AVH. Pt verbally contracts to seek staff if SI,HI, or AVH occurs and to consult with staff before acting on any harmful thoughts. Will continue to monitor.

## 2016-10-28 MED ORDER — BACITRACIN-NEOMYCIN-POLYMYXIN 400-5-5000 EX OINT
TOPICAL_OINTMENT | CUTANEOUS | Status: AC
  Administered 2016-10-28: 12:00:00
  Filled 2016-10-28: qty 1

## 2016-10-28 NOTE — [facility] Group Notes (Signed)
LCSW Group Therapy Note   10/28/2016 1:15pm   Type of Therapy and Topic:  Group Therapy:  Overcoming Obstacles   Participation Level:  Did Not Attend   Description of Group:    In this group patients will be encouraged to explore what they see as obstacles to their own wellness and recovery. They will be guided to discuss their thoughts, feelings, and behaviors related to these obstacles. The group will process together ways to cope with barriers, with attention given to specific choices patients can make. Each patient will be challenged to identify changes they are motivated to make in order to overcome their obstacles. This group will be process-oriented, with patients participating in exploration of their own experiences as well as giving and receiving support and challenge from other group members.   Therapeutic Goals: 1. Patient will identify personal and current obstacles as they relate to admission. 2. Patient will identify barriers that currently interfere with their wellness or overcoming obstacles.  3. Patient will identify feelings, thought process and behaviors related to these barriers. 4. Patient will identify two changes they are willing to make to overcome these obstacles:      Summary of Patient Progress      Therapeutic Modalities:   Cognitive Behavioral Therapy Solution Focused Therapy Motivational Interviewing Relapse Prevention Therapy  Ida Rogue, LCSW 10/28/2016 3:20 PM

## 2016-10-28 NOTE — Tx Team (Signed)
Interdisciplinary Treatment and Diagnostic Plan Update  10/28/2016 Time of Session: 1:01 PM  Leonard Guerrero MRN: [id number]  Principal Diagnosis: Schizophrenia [facility])  Secondary Diagnoses: Principal Problem:   Schizophrenia (HCC) Active Problems:   Alcohol use disorder, severe, dependence (HCC)   Cocaine use disorder, moderate, dependence (HCC)   MDD (major depressive disorder), recurrent, severe, with psychosis (HCC)   Current Medications:  Current Facility-Administered Medications  Medication Dose Route Frequency Provider Last Rate Last Dose  . acetaminophen (TYLENOL) tablet 650 mg  650 mg Oral Q6H PRN Laveda Abbe, NP      . alum & mag hydroxide-simeth (MAALOX/MYLANTA) 200-200-20 MG/5ML suspension 30 mL  30 mL Oral Q4H PRN Laveda Abbe, NP      . escitalopram (LEXAPRO) tablet 15 mg  15 mg Oral Daily Jomarie Longs, MD   15 mg at 10/28/16 0742  . gabapentin (NEURONTIN) capsule 200 mg  200 mg Oral BID Laveda Abbe, NP   200 mg at 10/28/16 9562  . hydrOXYzine (ATARAX/VISTARIL) tablet 25 mg  25 mg Oral Q6H PRN Laveda Abbe, NP      . magnesium hydroxide (MILK OF MAGNESIA) suspension 30 mL  30 mL Oral Daily PRN Laveda Abbe, NP      . nicotine (NICODERM CQ - dosed in mg/24 hours) patch 21 mg  21 mg Transdermal Q0600 Kerry Hough, PA-C   21 mg at 10/28/16 0743  . [START ON 11/20/2016] paliperidone (INVEGA SUSTENNA) injection 117 mg  117 mg Intramuscular Q28 days Eappen, Levin Bacon, MD      . paliperidone (INVEGA) 24 hr tablet 6 mg  6 mg Oral Daily Laveda Abbe, NP   6 mg at 10/28/16 0742  . traZODone (DESYREL) tablet 200 mg  200 mg Oral QHS,MR X 1 Nwoko, Agnes I, NP   200 mg at 10/27/16 2215  . trihexyphenidyl (ARTANE) tablet 5 mg  5 mg Oral TID WC Laveda Abbe, NP   5 mg at 10/28/16 1109    PTA Medications: Prescriptions Prior to Admission  Medication Sig Dispense Refill Last Dose  . acamprosate (CAMPRAL) 333 MG tablet Take  2 tablets (666 mg total) by mouth 3 (three) times daily with meals. For alcoholism (Patient not taking: Reported on 10/20/2016) 180 tablet 0 Not Taking at Unknown time  . amLODipine (NORVASC) 5 MG tablet Take 1 tablet (5 mg total) by mouth daily. (Patient not taking: Reported on 10/20/2016) 90 tablet 1 Not Taking at Unknown time  . benztropine (COGENTIN) 1 MG tablet Take 1 mg by mouth every morning. & 1/2 tablet at bedtime   Not Taking at Unknown time  . benztropine (COGENTIN) 2 MG tablet Take 1 tablet (2 mg total) by mouth 2 (two) times daily. For prevention of drug induced tremors. (Patient not taking: Reported on 10/20/2016) 60 tablet 0 Not Taking at Unknown time  . chlordiazePOXIDE (LIBRIUM) 25 MG capsule  PO TID x 1D, then 25-50mg  PO BID X 1D, then 25-50mg  PO QD X 1D (Patient not taking: Reported on 10/20/2016) 10 capsule 0 Not Taking at Unknown time  . escitalopram (LEXAPRO) 5 MG tablet Take 1 tablet (5 mg total) by mouth daily. For depression (Patient not taking: Reported on 10/20/2016) 30 tablet 0 Not Taking at Unknown time  . folic acid (FOLVITE) 1 MG tablet Take 1 tablet (1 mg total) by mouth daily. For low Folate replacement (Patient not taking: Reported on 10/20/2016) 10 tablet 0 Not Taking at Unknown time  . haloperidol (  HALDOL) 0.5 MG tablet Take 0.5 mg by mouth 2 (two) times daily.   Not Taking at Unknown time  . hydrOXYzine (ATARAX/VISTARIL) 25 MG tablet Take 1 tablet (25 mg total) by mouth every 6 (six) hours as needed for anxiety (Sleep). (Patient not taking: Reported on 10/20/2016) 60 tablet 0 Not Taking at Unknown time  . lamoTRIgine (LAMICTAL) 25 MG tablet Take 25 mg by mouth 2 (two) times daily.   Not Taking at Unknown time  . nicotine polacrilex (NICORETTE) 2 MG gum Take 1 each (2 mg total) by mouth as needed for smoking cessation. (Patient not taking: Reported on 10/20/2016) 100 tablet 0 Not Taking at Unknown time  . Paliperidone Palmitate (INVEGA TRINZA IM) Inject into the muscle.    Not Taking at Unknown time  . Paliperidone Palmitate 546 MG/1.75ML SUSP Inject 546 mg into the muscle every 3 (three) months. (Due 06-27-16): For mood control (Patient not taking: Reported on 10/20/2016) 1.75 mL 0 Not Taking at Unknown time  . QUEtiapine (SEROQUEL XR) 400 MG 24 hr tablet Take 800 mg by mouth at bedtime.   Not Taking at Unknown time  . thiamine 100 MG tablet Take 1 tablet (100 mg total) by mouth daily. For low thiamine replacement (Patient not taking: Reported on 10/20/2016) 10 tablet 0 Not Taking at Unknown time  . traZODone (DESYREL) 50 MG tablet Take 1 tablet (50 mg total) by mouth at bedtime as needed for sleep. (Patient not taking: Reported on 10/20/2016) 30 tablet 0 Not Taking at Unknown time    Treatment Modalities: Medication Management, Group therapy, Case management,  1 to 1 session with clinician, Psychoeducation, Recreational therapy.  Patient Stressors: Medication change or noncompliance Substance abuse  Patient Strengths: Barrister's clerk for treatment/growth Supportive family/friends   Physician Treatment Plan for Primary Diagnosis: Schizophrenia (HCC) Long Term Goal(s): Improvement in symptoms so as ready for discharge  Short Term Goals: Ability to verbalize feelings will improve Compliance with prescribed medications will improve Ability to identify triggers associated with substance abuse/mental health issues will improve Ability to verbalize feelings will improve Compliance with prescribed medications will improve Ability to identify triggers associated with substance abuse/mental health issues will improve  Medication Management: Evaluate patient's response, side effects, and tolerance of medication regimen.  Therapeutic Interventions: 1 to 1 sessions, Unit Group sessions and Medication administration.  Evaluation of Outcomes: Adequate for Discharge  Physician Treatment Plan for Secondary Diagnosis: Principal Problem:   Schizophrenia  (HCC) Active Problems:   Alcohol use disorder, severe, dependence (HCC)   Cocaine use disorder, moderate, dependence (HCC)   MDD (major depressive disorder), recurrent, severe, with psychosis (HCC)  Long Term Goal(s): Improvement in symptoms so as ready for discharge  Short Term Goals: Ability to verbalize feelings will improve Compliance with prescribed medications will improve Ability to identify triggers associated with substance abuse/mental health issues will improve Ability to verbalize feelings will improve Compliance with prescribed medications will improve Ability to identify triggers associated with substance abuse/mental health issues will improve  Medication Management: Evaluate patient's response, side effects, and tolerance of medication regimen.  Therapeutic Interventions: 1 to 1 sessions, Unit Group sessions and Medication administration.  Evaluation of Outcomes: Adequate for Discharge   RN Treatment Plan for Primary Diagnosis: Schizophrenia (HCC) Long Term Goal(s): Knowledge of disease and therapeutic regimen to maintain health will improve  Short Term Goals: Ability to remain free from injury will improve, Ability to disclose and discuss suicidal ideas, Ability to identify and develop effective coping behaviors  will improve and Compliance with prescribed medications will improve  Medication Management: RN will administer medications as ordered by provider, will assess and evaluate patient's response and provide education to patient for prescribed medication. RN will report any adverse and/or side effects to prescribing provider.  Therapeutic Interventions: 1 on 1 counseling sessions, Psychoeducation, Medication administration, Evaluate responses to treatment, Monitor vital signs and CBGs as ordered, Perform/monitor CIWA, COWS, AIMS and Fall Risk screenings as ordered, Perform wound care treatments as ordered.  Evaluation of Outcomes: Adequate for Discharge   LCSW  Treatment Plan for Primary Diagnosis: Schizophrenia [facility]) Long Term Goal(s): Safe transition to appropriate next level of care at discharge, Engage patient in therapeutic group addressing interpersonal concerns.  Short Term Goals: Engage patient in aftercare planning with referrals and resources, Facilitate acceptance of mental health diagnosis and concerns, Facilitate patient progression through stages of change regarding substance use diagnoses and concerns, Identify triggers associated with mental health/substance abuse issues and Increase skills for wellness and recovery  Therapeutic Interventions: Assess for all discharge needs, 1 to 1 time with Social worker, Explore available resources and support systems, Assess for adequacy in community support network, Educate family and significant other(s) on suicide prevention, Complete Psychosocial Assessment, Interpersonal group therapy.  Evaluation of Outcomes: Adequate for Discharge   Progress in Treatment: Attending groups: Yes Participating in groups: Yes Taking medication as prescribed: Yes Toleration of medication: Yes, no side effects reported at this time Family/Significant other contact made: No  Patient understands diagnosis: No, limited insight  Discussing patient identified problems/goals with staff: Yes Medical problems stabilized or resolved: Yes Denies suicidal/homicidal ideation: Yes Issues/concerns per patient self-inventory: None Other: N/A  New problem(s) identified: None identified at this time.   New Short Term/Long Term Goal(s): Pt stated that he wants help "Getting out of the hospital and I got everything that I needed in the emergency room".   Discharge Plan or Barriers: He states that if he leaves before the first of the month when he gets his check he will go home with mom.  If he leaves after the first of the month he will get a hotel room on his own. Follow up [facility] and [facility] for CST  Reason for Continuation of  Hospitalization:  Medication stabilization   Estimated Length of Stay: [facility] d/c tomorrow   Attendees: Patient:  10/28/2016  1:01 PM  Physician: Jomarie Longs, MD 10/28/2016  1:01 PM  Nursing: Estella Husk, RN 10/28/2016  1:01 PM  RN Care Manager: Onnie Boer, RN 10/28/2016  1:01 PM  Social Worker: Richelle Ito, LCSW; Melba Coon, Social Work Intern 10/28/2016  1:01 PM  Recreational Therapist: Caroll Rancher, LRT 10/28/2016  1:01 PM  Other: Tomasita Morrow, P4CC 10/28/2016  1:01 PM  Other:  10/28/2016  1:01 PM  Other: 10/28/2016  1:01 PM    Scribe for Treatment Team: Ida Rogue, LCSW 10/28/2016 1:01 PM

## 2016-10-28 NOTE — Progress Notes (Signed)
Did not attend group 

## 2016-10-28 NOTE — Progress Notes (Signed)
[facility] MD Progress Note  10/28/2016 12:36 PM Leonard Guerrero  MRN:  [id number]  Subjective:Patient states " I am better.'   Objective:Patient seen and chart reviewed.Discussed patient with treatment team.  Pt today seen as less anxious , depression seems to be improving. Pt is motivated to continue his medications . He has tolerated the Invega sustenna IM well. Pt denies any ADRs. Pt states his mother will be supportive on discharge and will manage his finances for him, he already has a room that his mother's friend was able to get for him and the rent has already been paid. Pt looks forward to going there. Continue to encourage and support.        Principal Problem: Schizophrenia (HCC)  Diagnosis:   Patient Active Problem List   Diagnosis Date Noted  . Schizophrenia (HCC) [F20.9] 10/23/2016  . MDD (major depressive disorder), recurrent, severe, with psychosis (HCC) [F33.3] 10/23/2016  . Elevated TSH [R94.6] 06/18/2016  . Paranoid schizophrenia (HCC) [F20.0] 06/17/2016  . Major depressive disorder with single episode [F32.9] 06/17/2016  . Alcohol use disorder, severe, dependence (HCC) [F10.20] 06/17/2016  . Cocaine use disorder, moderate, dependence (HCC) [F14.20] 06/17/2016  . Alcoholic intoxication without complication (HCC) [F10.920]   . Acute encephalopathy [G93.40] 06/08/2016  . Acute respiratory failure (HCC) [J96.00]   . Altered mental status [R41.82]   . Encephalopathy acute [G93.40] 06/07/2016  . Chest pain [R07.9] 04/04/2015   Total Time spent with patient: 20 minutes  Past Psychiatric History: Please see H&P.   Past Medical History:  Past Medical History:  Diagnosis Date  . Bipolar 1 disorder (HCC)   . ETOH abuse   . Hypertension   . Paranoid schizophrenia [facility])     Past Surgical History:  Procedure Laterality Date  . DENTAL SURGERY     complete extraction   Family History:  Family History  Problem Relation Age of Onset  . Mental illness Father   .  Heart attack Father   . Hypertension Mother   . Mental illness Sister    Family Psychiatric  History: Please see H&P.   Social History:  History  Alcohol Use  . 1.8 - 2.4 oz/week  . 3 - 4 Cans of beer per week     History  Drug use: Unknown    Social History   Social History  . Marital status: Unknown    Spouse name: N/A  . Number of children: N/A  . Years of education: N/A   Social History Main Topics  . Smoking status: Current Every Day Smoker    Packs/day: 1.00    Years: 10.00    Types: Cigarettes  . Smokeless tobacco: Never Used  . Alcohol use 1.8 - 2.4 oz/week    3 - 4 Cans of beer per week  . Drug use: Unknown  . Sexual activity: Not Asked   Other Topics Concern  . None   Social History Narrative   ** Merged History Encounter **       Additional Social History:   Sleep: Fair  Appetite:  Fair  Current Medications: Current Facility-Administered Medications  Medication Dose Route Frequency Provider Last Rate Last Dose  . acetaminophen (TYLENOL) tablet 650 mg  650 mg Oral Q6H PRN Laveda Abbe, NP      . alum & mag hydroxide-simeth (MAALOX/MYLANTA) 200-200-20 MG/5ML suspension 30 mL  30 mL Oral Q4H PRN Laveda Abbe, NP      . escitalopram (LEXAPRO) tablet 15 mg  15 mg Oral  Daily Jomarie Longs, MD   15 mg at 10/28/16 0742  . gabapentin (NEURONTIN) capsule 200 mg  200 mg Oral BID Laveda Abbe, NP   200 mg at 10/28/16 1610  . hydrOXYzine (ATARAX/VISTARIL) tablet 25 mg  25 mg Oral Q6H PRN Laveda Abbe, NP      . magnesium hydroxide (MILK OF MAGNESIA) suspension 30 mL  30 mL Oral Daily PRN Laveda Abbe, NP      . nicotine (NICODERM CQ - dosed in mg/24 hours) patch 21 mg  21 mg Transdermal Q0600 Kerry Hough, PA-C   21 mg at 10/28/16 0743  . [START ON 11/20/2016] paliperidone (INVEGA SUSTENNA) injection 117 mg  117 mg Intramuscular Q28 days Deseri Loss, Levin Bacon, MD      . paliperidone (INVEGA) 24 hr tablet 6 mg  6 mg  Oral Daily Laveda Abbe, NP   6 mg at 10/28/16 0742  . traZODone (DESYREL) tablet 200 mg  200 mg Oral QHS,MR X 1 Nwoko, Agnes I, NP   200 mg at 10/27/16 2215  . trihexyphenidyl (ARTANE) tablet 5 mg  5 mg Oral TID WC Laveda Abbe, NP   5 mg at 10/28/16 1109   Lab Results:  No results found for this or any previous visit (from the past 48 hour(s)). Blood Alcohol level:  Lab Results  Component Value Date   ETH 6 (H) 10/20/2016   ETH 16 (H) 06/07/2016   Metabolic Disorder Labs: Lab Results  Component Value Date   HGBA1C 5.1 06/18/2016   MPG 100 06/18/2016   Lab Results  Component Value Date   PROLACTIN 28.5 (H) 06/18/2016   Lab Results  Component Value Date   CHOL 213 (H) 06/18/2016   TRIG 181 (H) 06/18/2016   HDL 35 (L) 06/18/2016   CHOLHDL 6.1 06/18/2016   VLDL 36 06/18/2016   LDLCALC 142 (H) 06/18/2016   Physical Findings: AIMS: Facial and Oral Movements Muscles of Facial Expression: None, normal Lips and Perioral Area: None, normal Jaw: None, normal Tongue: None, normal,Extremity Movements Upper (arms, wrists, hands, fingers): None, normal Lower (legs, knees, ankles, toes): None, normal, Trunk Movements Neck, shoulders, hips: None, normal, Overall Severity Severity of abnormal movements (highest score from questions above): None, normal Incapacitation due to abnormal movements: None, normal Patient's awareness of abnormal movements (rate only patient's report): No Awareness, Dental Status Current problems with teeth and/or dentures?: No Does patient usually wear dentures?: Yes  CIWA:    COWS:     Musculoskeletal: Strength & Muscle Tone: within normal limits Gait & Station: normal Patient leans: N/A  Psychiatric Specialty Exam: Physical Exam  Nursing note and vitals reviewed.   Review of Systems  Psychiatric/Behavioral: The patient is nervous/anxious.   All other systems reviewed and are negative.   Blood pressure 109/75, pulse 70,  temperature 97.8 F (36.6 C), resp. rate 16, height 5' 11.5" (1.816 m), weight 91.6 kg (202 lb), SpO2 97 %.Body mass index is 27.78 kg/m.  General Appearance: casual  Eye Contact:  fair  Speech:   normal rate  Volume:  normal  Mood: improving  Affect:  more reactive  Thought Process:  goal directed  and Descriptions of Associations: linear  Orientation:  x3   Thought Content: denies delusiona  , Hallucinations : AH improving   Suicidal Thoughts: denies now, S/P suicide attempt  Homicidal Thoughts: denies  Memory:  Immediate;    fair Recent;   fair Remote;   fair  Judgement:impaired  Insight: shallow  Psychomotor Activity:  normal  Concentration:  fair, Attention Span:fair  Recall:  fair  Fund of Knowledge:   fair  Language:  fair  Akathisia:  no  Handed:   right  AIMS (if indicated):     Assets:  social support, housing  ADL's:  fair  Cognition:wnl  Sleep:  Number of Hours: 6.5 hrs      Will continue today 10/28/16 plan as below except where it is noted.     Treatment Plan Summary:Patient with schizophrenia , as well as polysubstance abuse , is making progress, partial improvement , will continue treatment. Possible discharge tomorrow.  Daily contact with patient to assess and evaluate symptoms and progress in treatment, Medication management and Plan see below.   - Lexapro 15 mg po daily for affective sx. -Gabapentin 200 mg twice daily for agitation. -ContinuePaliperidone Hinda Glatter Lorelei Pont) injection 117 mg Q 28 days (due 11-20-16) for mood control. -Continue Paliperidone Hinda Glatter Sustenna) 234 mg IM  10-23-16 . 156 mg IM on 10/28/2016. -Continue Paliperidone (Invega) 6 mg daily for mood control -Continue Hydroxyzine 25 mg prn for anxiety Q 6 hours. -Continue Artane 5 mg tid for EPS. -Continue Trazodone 200 mg Q hs for insomnia. -Continue Keflex 500 mg for infection x 15 doses. _Encourage group participation. -Child psychotherapist to continue to work on the disposition  plan.  Jomarie Longs, MD 10/28/2016, 12:36 PMPatient ID: Leonard Guerrero, male   DOB: 06-15-1975, 41 y.o.   MRN: [id number]

## 2016-10-28 NOTE — Progress Notes (Signed)
D: Pt presents with a flat affect and an anxious mood. Pt observed pacing the hallway this morning. Pt stated that he's constantly having racing thoughts and that pacing helps to distract the thoughts. Pt denies SI/HI. Pt denies AVH. Pt reports difficulty sleeping at bedtime due to nightmares and racing thoughts.  A: Medications reviewed with pt. Medications administered as ordered per MD. Hinda Glatter injection administered to left deltoid. Pt dressing changed and neosporin applied. Verbal support provided. Pt encouraged to attend groups. 15 minute checks performed for safety.  R: Pt compliant with tx.

## 2016-10-28 NOTE — Progress Notes (Signed)
Recreation Therapy Notes  Date: 10/28/16 Time: 1000 Location: 500 Hall Dayroom  Group Topic: Coping Skills  Goal Area(s) Addresses:  Patients will be able to identify positive coping skills. Patients will be able to identify the benefits of positive coping skills. Patients will be able to identify benefits of using coping skills post d/c.  Intervention: Worksheet, pencils  Activity: Mind map.  Patients were given a blank mindmap.  Patients and LRT filled out the first 8 boxes together.  Patients identified mental health, stress, fear, loss of loved one, relationships, relapse/addiction, work and finances as situations they would need coping skills for.  Patients were to then identify 3 three coping skills for each of the areas identified.  Education: Pharmacologist, Building control surveyor.   Education Outcome: Acknowledges understanding/In group clarification offered/Needs additional education.   Clinical Observations/Feedback: Pt did not attend group.    Caroll Rancher, LRT/CTRS         Caroll Rancher A 10/28/2016 12:08 PM

## 2016-10-29 DIAGNOSIS — Z560 Unemployment, unspecified: Secondary | ICD-10-CM

## 2016-10-29 MED ORDER — PALIPERIDONE ER 6 MG PO TB24: 6.0000 mg | ORAL_TABLET | Freq: Every day | ORAL | 0 refills | Status: DC

## 2016-10-29 MED ORDER — GABAPENTIN 100 MG PO CAPS: 200.0000 mg | ORAL_CAPSULE | Freq: Two times a day (BID) | ORAL | 0 refills | Status: DC

## 2016-10-29 MED ORDER — TRIHEXYPHENIDYL HCL 5 MG PO TABS: 5.0000 mg | ORAL_TABLET | Freq: Three times a day (TID) | ORAL | 0 refills | Status: DC

## 2016-10-29 MED ORDER — PALIPERIDONE PALMITATE 156 MG/ML IM SUSP: 117.0000 mg | INTRAMUSCULAR | 0 refills | Status: DC

## 2016-10-29 MED ORDER — ESCITALOPRAM OXALATE 5 MG PO TABS: 15.0000 mg | ORAL_TABLET | Freq: Every day | ORAL | 0 refills | Status: DC

## 2016-10-29 MED ORDER — TRAZODONE HCL 100 MG PO TABS: 200.0000 mg | ORAL_TABLET | Freq: Every evening | ORAL | 0 refills | Status: DC | PRN

## 2016-10-29 MED ORDER — TRIHEXYPHENIDYL HCL 0.4 MG/ML PO ELIX
5.0000 mg | ORAL_SOLUTION | Freq: Three times a day (TID) | ORAL | Status: DC
  Filled 2016-10-29 (×4): qty 263

## 2016-10-29 MED ORDER — NICOTINE 21 MG/24HR TD PT24: 21.0000 mg | MEDICATED_PATCH | Freq: Every day | TRANSDERMAL | 0 refills | Status: DC

## 2016-10-29 MED ORDER — HYDROXYZINE HCL 25 MG PO TABS
25.0000 mg | ORAL_TABLET | Freq: Four times a day (QID) | ORAL | 0 refills | Status: DC | PRN
Start: 2016-10-29 — End: 2017-12-12

## 2016-10-29 NOTE — Progress Notes (Signed)
D: When asked about his day pt stated, "good'. Informed the writer that he's scheduled for discharge tomorrow. Stated, "I already have an appointment at [facility] Oct 1st at 11:00". When asked how he feels about the plans, pt stated, "I'm fine as long as I get my medicine". Pt has no questions or concerns.    A:  Support and encouragement was offered. 15 min checks continued for safety.  R: Pt remains safe.

## 2016-10-29 NOTE — Plan of Care (Signed)
Problem: [facility] Participation in Recreation Therapeutic Interventions Goal: STG-Patient will attend/participate in Rec Therapy Group Ses STG-The Patient will attend and participate in Recreation Therapy Group Sessions  Outcome: Not Met (add Reason) Pt did not attend recreation therapy groups.   , LRT/CTRS   

## 2016-10-29 NOTE — Progress Notes (Signed)
Recreation Therapy Notes  INPATIENT RECREATION TR PLAN  Patient Details Name: Leonard Guerrero MRN: [id number] DOB: 1975-09-06 Today's Date: 10/29/2016  Rec Therapy Plan Is patient appropriate for Therapeutic Recreation?: Yes Treatment times per week: about 3 days  Estimated Length of Stay: 5-7 days TR Treatment/Interventions: Group participation (Comment)  Discharge Criteria Pt will be discharged from therapy if:: Discharged Treatment plan/goals/alternatives discussed and agreed upon by:: Patient/family  Discharge Summary Short term goals set: Patient will attend and participate in Recreation Therapy Group Sessions  Short term goals met: Not met Reason goals not met: Pt did not attend groups Therapeutic equipment acquired: N/A Reason patient discharged from therapy: Discharge from hospital Pt/family agrees with progress & goals achieved: Yes Date patient discharged from therapy: 10/29/16   Victorino Sparrow, LRT/CTRS  Ria Comment, Katasha Riga A 10/29/2016, 11:58 AM

## 2016-10-29 NOTE — Discharge Summary (Signed)
Physician Discharge Summary Note  Patient:  Leonard Guerrero is an 41 y.o., male MRN:  [id number] DOB:  [birth year] Patient phone:  [phone] (home)  Patient address:   [address] [place] [address],  Total Time spent with patient: 45 minutes  Date of Admission:  10/22/2016 Date of Discharge: 10/29/16  Reason for Admission:   Leonard Guerrero is a 80 y old CM, who is single , has a hx of schizophrenia as well as polysubstance abuse ,homeless, noncompliant with medications presented to [facility] s/p suicide attempt by cutting.  Patient seen and chart reviewed.Discussed patient with treatment team. Pt today seen as depressed , reports sadness all day , since the past several days , sleep issues, appetite is decreased, and has SI , s/p attempt , cut his wrist which is currently in dressing now. Pt reports AH asking him to kill self and take someone else with him. Pt reports he has been noncompliant with his Invega LAI injection due in august, he gets it every three months. Pt reports he went to live with his daughter in [place] after his discharge from IP unit at [facility] in may 2018. Pt however contacted his children who were adopted away from him  , whom he never met unit now . They live in [place] and he hence came here to visit them. He started living with an aunt soon after that in [place] , however his aunt is sick and he is afraid he might find her dead one day and he will be blamed for it. He hence left her and currently stays here and there. Pt reports that he started abusing alcohol again and currently feels guilty and wants to get help.  Pt reports he did well on Lexapro and Invega LAI and wants to get back on it .  Principal Problem: Schizophrenia [facility]) Discharge Diagnoses: Patient Active Problem List   Diagnosis Date Noted  . Schizophrenia ([place]) [F20.9] 10/23/2016  . MDD (major depressive disorder), recurrent, severe, with psychosis ([place]) [F33.3] 10/23/2016  . Elevated TSH [R94.6] 06/18/2016  .  Paranoid schizophrenia ([place]) [F20.0] 06/17/2016  . Major depressive disorder with single episode [F32.9] 06/17/2016  . Alcohol use disorder, severe, dependence ([place]) [F10.20] 06/17/2016  . Cocaine use disorder, moderate, dependence ([place]) [F14.20] 06/17/2016  . Alcoholic intoxication without complication ([place]) [L87.564]   . Acute encephalopathy [G93.40] 06/08/2016  . Acute respiratory failure ([place]) [J96.00]   . Altered mental status [R41.82]   . Encephalopathy acute [G93.40] 06/07/2016  . Chest pain [R07.9] 04/04/2015    Past Psychiatric History: see H&P  Past Medical History:  Past Medical History:  Diagnosis Date  . Bipolar 1 disorder ([place])   . ETOH abuse   . Hypertension   . Paranoid schizophrenia [facility])     Past Surgical History:  Procedure Laterality Date  . DENTAL SURGERY     complete extraction   Family History:  Family History  Problem Relation Age of Onset  . Mental illness Father   . Heart attack Father   . Hypertension Mother   . Mental illness Sister    Family Psychiatric  History: see H&P Social History:  History  Alcohol Use  . 1.8 - 2.4 oz/week  . 3 - 4 Cans of beer per week     History  Drug use: Unknown    Social History   Social History  . Marital status: Unknown    Spouse name: N/A  . Number of children: N/A  . Years of  education: N/A   Social History Main Topics  . Smoking status: Current Every Day Smoker    Packs/day: 1.00    Years: 10.00    Types: Cigarettes  . Smokeless tobacco: Never Used  . Alcohol use 1.8 - 2.4 oz/week    3 - 4 Cans of beer per week  . Drug use: Unknown  . Sexual activity: Not Asked   Other Topics Concern  . None   Social History Narrative   ** Merged History Encounter **        Hospital Course:   Leonard Guerrero was admitted for Schizophrenia [facility]) , with psychosis and crisis management.  Pt was treated discharged with the medications listed below under Medication List.  Medical problems were identified  and treated as needed.  Home medications were restarted as appropriate.  Improvement was monitored by observation and Leonard Guerrero 's daily report of symptom reduction.  Emotional and mental status was monitored by daily self-inventory reports completed by Leonard Guerrero and clinical staff.         Leonard Guerrero was evaluated by the treatment team for stability and plans for continued recovery upon discharge. Leonard Guerrero 's motivation was an integral factor for scheduling further treatment. Employment, transportation, bed availability, health status, family support, and any pending legal issues were also considered during hospital stay. Pt was offered further treatment options upon discharge including but not limited to Residential, Intensive Outpatient, and Outpatient treatment.  Leonard Guerrero will follow up with the services as listed below under Follow Up Information.     Upon completion of this admission the patient was both mentally and medically stable for discharge denying suicidal/homicidal ideation, auditory/visual/tactile hallucinations, delusional thoughts and paranoia.    Family session went well. No seclusion or restraint.  Leonard Guerrero responded well to treatment with lexapro, gabapentin, vistaril, nicoderm, invega, trazodone, artane without adverse effects. Pt demonstrated improvement without reported or observed adverse effects to the point of stability appropriate for outpatient management. Pertinent labs include: UDS+ benzo, Platelets 122 for which outpatient follow-up is necessary for lab recheck as mentioned below. Reviewed CBC, CMP, BAL, and UDS; all unremarkable aside from noted exceptions.    Physical Findings: AIMS: Facial and Oral Movements Muscles of Facial Expression: None, normal Lips and Perioral Area: None, normal Jaw: None, normal Tongue: None, normal,Extremity Movements Upper (arms, wrists, hands, fingers): None, normal Lower (legs, knees, ankles, toes): None, normal,  Trunk Movements Neck, shoulders, hips: None, normal, Overall Severity Severity of abnormal movements (highest score from questions above): None, normal Incapacitation due to abnormal movements: None, normal Patient's awareness of abnormal movements (rate only patient's report): No Awareness, Dental Status Current problems with teeth and/or dentures?: No Does patient usually wear dentures?: Yes  CIWA:    COWS:     Musculoskeletal: Strength & Muscle Tone: within normal limits Gait & Station: normal Patient leans: N/A  Psychiatric Specialty Exam: Physical Exam  Review of Systems  Psychiatric/Behavioral: Positive for depression. Negative for suicidal ideas.  All other systems reviewed and are negative.   Blood pressure 109/75, pulse 70, temperature 97.8 F (36.6 C), resp. rate 16, height 5' 11.5" (1.816 m), weight 91.6 kg (202 lb), SpO2 97 %.Body mass index is 27.78 kg/m.  SEE MD PSE WITHIN SRA  Have you used any form of tobacco in the last 30 days? (Cigarettes, Smokeless Tobacco, Cigars, and/or Pipes): Yes  Has this patient used any form of tobacco in the last 30 days? (Cigarettes, Smokeless Tobacco, Cigars, and/or Pipes) Yes,  Yes, A prescription for an FDA-approved tobacco cessation medication was offered at discharge and the patient refused  Blood Alcohol level:  Lab Results  Component Value Date   ETH 6 (H) 10/20/2016   ETH 16 (H) 96/22/2979    Metabolic Disorder Labs:  Lab Results  Component Value Date   HGBA1C 5.1 06/18/2016   MPG 100 06/18/2016   Lab Results  Component Value Date   PROLACTIN 28.5 (H) 06/18/2016   Lab Results  Component Value Date   CHOL 213 (H) 06/18/2016   TRIG 181 (H) 06/18/2016   HDL 35 (L) 06/18/2016   CHOLHDL 6.1 06/18/2016   VLDL 36 06/18/2016   LDLCALC 142 (H) 06/18/2016    See Psychiatric Specialty Exam and Suicide Risk Assessment completed by Attending Physician prior to discharge.  Discharge destination:  Home  Is patient on  multiple antipsychotic therapies at discharge:  No   Has Patient had three or more failed trials of antipsychotic monotherapy by history:  No  Recommended Plan for Multiple Antipsychotic Therapies: NA   Allergies as of 10/29/2016   No Known Allergies     Medication List    STOP taking these medications   acamprosate 333 MG tablet Commonly known as:  CAMPRAL   amLODipine 5 MG tablet Commonly known as:  NORVASC   benztropine 1 MG tablet Commonly known as:  COGENTIN   benztropine 2 MG tablet Commonly known as:  COGENTIN   chlordiazePOXIDE 25 MG capsule Commonly known as:  LIBRIUM   folic acid 1 MG tablet Commonly known as:  FOLVITE   haloperidol 0.5 MG tablet Commonly known as:  HALDOL   INVEGA TRINZA IM Replaced by:  paliperidone 156 MG/ML Susp injection   lamoTRIgine 25 MG tablet Commonly known as:  LAMICTAL   nicotine polacrilex 2 MG gum Commonly known as:  NICORETTE   Paliperidone Palmitate 546 MG/1.75ML Susp   QUEtiapine 400 MG 24 hr tablet Commonly known as:  SEROQUEL XR   thiamine 100 MG tablet     TAKE these medications     Indication  escitalopram 5 MG tablet Commonly known as:  LEXAPRO Take 3 tablets (15 mg total) by mouth daily. What changed:  how much to take  additional instructions  Indication:  Major Depressive Disorder   gabapentin 100 MG capsule Commonly known as:  NEURONTIN Take 2 capsules (200 mg total) by mouth 2 (two) times daily.  Indication:  Agitation   hydrOXYzine 25 MG tablet Commonly known as:  ATARAX/VISTARIL Take 1 tablet (25 mg total) by mouth every 6 (six) hours as needed for anxiety. What changed:  reasons to take this  Indication:  Feeling Anxious   nicotine 21 mg/24hr patch Commonly known as:  NICODERM CQ - dosed in mg/24 hours Place 1 patch (21 mg total) onto the skin daily at 6 (six) AM.  Indication:  Nicotine Addiction   paliperidone 156 MG/ML Susp injection Commonly known as:  INVEGA SUSTENNA Inject  0.75 mLs (117 mg total) into the muscle every 28 (twenty-eight) days. Replaces:  INVEGA TRINZA IM  Indication:  psychotic features   paliperidone 6 MG 24 hr tablet Commonly known as:  INVEGA Take 1 tablet (6 mg total) by mouth daily.  Indication:  Schizophrenia   traZODone 100 MG tablet Commonly known as:  DESYREL Take 2 tablets (200 mg total) by mouth at bedtime as needed for sleep. What changed:  medication strength  how much to take  Indication:  Trouble Sleeping   trihexyphenidyl 5  MG tablet Commonly known as:  ARTANE Take 1 tablet (5 mg total) by mouth 3 (three) times daily with meals.  Indication:  Extrapyramidal Reaction caused by Medications      Follow-up Information    [facility] Follow up on 10/31/2016.   Specialty:  Behavioral Health Why:  Thursday at 9:45 with Grover Canavan for your hospital follow up appointment Contact information: [place] [address] [place] Follow up.   Why:  Someone will contact you within 3 days of d/c to set up an assessment for services.  If you do not hear from them by Friday, call Delores to set up an appointment Contact information: [place] [address] [phone]           Follow-up recommendations:  Activity:  As tolerated Diet:  heart healthy with low sodium.  Comments:   Take all medications as prescribed. Keep all follow-up appointments as scheduled.  Do not consume alcohol or use illegal drugs while on prescription medications. Report any adverse effects from your medications to your primary care provider promptly.  In the event of recurrent symptoms or worsening symptoms, call 911, a crisis hotline, or go to the nearest emergency department for evaluation.    Signed: Benjamine Mola, FNP 10/29/2016, 9:25 AM

## 2016-10-29 NOTE — Progress Notes (Signed)
Recreation Therapy Notes  Date: 10/29/16 Time: 1000 Location: 500 Hall Dayroom  Group Topic: Wellness  Goal Area(s) Addresses:  Patient will define components of whole wellness. Patient will verbalize benefit of whole wellness.  Intervention:  Chairs, 2 small beach balls     Activity: Group Juggle.  Patients were seated in a circle.  Patients were to toss the ball around to each other.  Once patients were able to get the first ball going, LRT would add second ball to the rotation.  Patients were to keep the balls going.  Patients could bounce the balls off the floor but the balls could not stop.  If one of the balls stopped, the person closest to it would throw it back into play.  If both balls stopped, play would start over with one ball.  Education: Wellness, Building control surveyor.   Education Outcome: Acknowledges education/In group clarification offered/Needs additional education.   Clinical Observations/Feedback: Pt did not attend group.   Caroll Rancher, LRT/CTRS         Caroll Rancher A 10/29/2016 11:47 AM

## 2016-10-29 NOTE — Progress Notes (Signed)
  [facility] Adult Case Management Discharge Plan :  Will you be returning to the same living situation after discharge:  Yes,  home At discharge, do you have transportation home?: Yes,  mother Do you have the ability to pay for your medications: Yes,  mental health  Release of information consent forms completed and in the chart;  Patient's signature needed at discharge.  Patient to Follow up at: Follow-up Information    [facility] Follow up on 10/31/2016.   Specialty:  Behavioral Health Why:  Thursday at 9:45 with Tyler Aas for your hospital follow up appointment Contact information: [address] [place] [address] [phone]        [facility] Follow up.   Why:  Someone will contact you within 3 days of d/c to set up an assessment for services.  If you do not hear from them by Friday, call Delores to set up an appointment Contact information: [address] [place] [address] [phone]           Next level of care provider has access to [facility] Link:no  Safety Planning and Suicide Prevention discussed: Yes,  yes  Have you used any form of tobacco in the last 30 days? (Cigarettes, Smokeless Tobacco, Cigars, and/or Pipes): Yes  Has patient been referred to the Quitline?: Patient refused referral  Patient has been referred for addiction treatment: Pt. refused referral  Ida Rogue, LCSW 10/29/2016, 9:03 AM

## 2016-10-29 NOTE — BHH Suicide Risk Assessment (Signed)
[facility] Discharge Suicide Risk Assessment   Principal Problem: Schizophrenia [facility]) Discharge Diagnoses:  Patient Active Problem List   Diagnosis Date Noted  . Schizophrenia ([facility]) [F20.9] 10/23/2016  . MDD (major depressive disorder), recurrent, severe, with psychosis ([facility]) [F33.3] 10/23/2016  . Elevated TSH [R94.6] 06/18/2016  . Paranoid schizophrenia ([facility]) [F20.0] 06/17/2016  . Major depressive disorder with single episode [F32.9] 06/17/2016  . Alcohol use disorder, severe, dependence ([facility]) [F10.20] 06/17/2016  . Cocaine use disorder, moderate, dependence ([facility]) [F14.20] 06/17/2016  . Alcoholic intoxication without complication (HCC) [F10.920]   . Acute encephalopathy [G93.40] 06/08/2016  . Acute respiratory failure ([facility]) [J96.00]   . Altered mental status [R41.82]   . Encephalopathy acute [G93.40] 06/07/2016  . Chest pain [R07.9] 04/04/2015    Total Time spent with patient: 30 minutes  Musculoskeletal: Strength & Muscle Tone: within normal limits Gait & Station: normal Patient leans: N/A  Psychiatric Specialty Exam: Review of Systems  Psychiatric/Behavioral: Positive for substance abuse. Negative for depression, hallucinations and suicidal ideas.  All other systems reviewed and are negative.   Blood pressure 109/75, pulse 70, temperature 97.8 F (36.6 C), resp. rate 16, height 5' 11.5" (1.816 m), weight 91.6 kg (202 lb), SpO2 97 %.Body mass index is 27.78 kg/m.  General Appearance: Casual  Eye Contact::  Fair  Speech:  Clear and Coherent409  Volume:  Normal  Mood:  Euthymic  Affect:  Appropriate  Thought Process:  Goal Directed and Descriptions of Associations: Intact  Orientation:  Full (Time, Place, and Person)  Thought Content:  Logical  Suicidal Thoughts:  No  Homicidal Thoughts:  No  Memory:  Immediate;   Fair Recent;   Fair Remote;   Fair  Judgement:  Fair  Insight:  Fair  Psychomotor Activity:  Normal  Concentration:  Fair  Recall:  [employer] of  Knowledge:Fair  Language: Fair  Akathisia:  No  Handed:  Right  AIMS (if indicated):     Assets:  Communication Skills Desire for Improvement  Sleep:  Number of Hours: 5.75  Cognition: WNL  ADL's:  Intact   Mental Status Per Nursing Assessment::   On Admission:  Suicidal ideation indicated by patient, Suicide plan, Plan includes specific time, place, or method, Self-harm thoughts, Self-harm behaviors, Intention to act on suicide plan, Belief that plan would result in death  Demographic Factors:  Male and Caucasian  Loss Factors: NA  Historical Factors: Impulsivity  Risk Reduction Factors:   Positive therapeutic relationship and Positive coping skills or problem solving skills  Continued Clinical Symptoms:  Alcohol/Substance Abuse/Dependencies Previous Psychiatric Diagnoses and Treatments  Cognitive Features That Contribute To Risk:  None    Suicide Risk:  Minimal: No identifiable suicidal ideation.  Patients presenting with no risk factors but with morbid ruminations; may be classified as minimal risk based on the severity of the depressive symptoms  Follow-up Information    [facility] Follow up on 10/31/2016.   Specialty:  Behavioral Health Why:  Thursday at 9:45 with Tyler Aas for your hospital follow up appointment Contact information: [address] [place] [address] [phone]        [facility] Follow up.   Why:  Someone will contact you within 3 days of d/c to set up an assessment for services.  If you do not hear from them by Friday, call Delores to set up an appointment Contact information: [address] [place] [address] [phone]           Plan Of Care/Follow-up  recommendations:  Activity:  no restrictions Diet:  regular Tests:  as needed Other:  follow up with aftercare  Jomarie Longs, MD 10/29/2016, 10:14 AM

## 2016-10-29 NOTE — Progress Notes (Signed)
Pt discharged to lobby. Pt was stable and appreciative at that time. All papers, samples and prescriptions were given and valuables returned. Verbal understanding expressed. Denies SI/HI and A/VH. Pt given opportunity to express concerns and ask questions.  

## 2017-06-15 ENCOUNTER — Emergency Department (HOSPITAL_COMMUNITY)
Admission: EM | Admit: 2017-06-15 | Discharge: 2017-06-17 | Disposition: A | Discharge status: Home / Self Care | Payer: Medicaid Other | Attending: Emergency Medicine | Admitting: Emergency Medicine

## 2017-06-15 DIAGNOSIS — R45850 Homicidal ideations: Secondary | ICD-10-CM | POA: Diagnosis not present

## 2017-06-15 DIAGNOSIS — I10 Essential (primary) hypertension: Secondary | ICD-10-CM | POA: Insufficient documentation

## 2017-06-15 DIAGNOSIS — F10920 Alcohol use, unspecified with intoxication, uncomplicated: Secondary | ICD-10-CM | POA: Insufficient documentation

## 2017-06-15 DIAGNOSIS — R440 Auditory hallucinations: Secondary | ICD-10-CM | POA: Diagnosis present

## 2017-06-15 DIAGNOSIS — R443 Hallucinations, unspecified: Secondary | ICD-10-CM

## 2017-06-15 DIAGNOSIS — Y907 Blood alcohol level of 200-239 mg/100 ml: Secondary | ICD-10-CM | POA: Insufficient documentation

## 2017-06-15 DIAGNOSIS — F1099 Alcohol use, unspecified with unspecified alcohol-induced disorder: Secondary | ICD-10-CM | POA: Diagnosis not present

## 2017-06-15 DIAGNOSIS — R4587 Impulsiveness: Secondary | ICD-10-CM | POA: Diagnosis not present

## 2017-06-15 DIAGNOSIS — F251 Schizoaffective disorder, depressive type: Secondary | ICD-10-CM | POA: Diagnosis present

## 2017-06-15 DIAGNOSIS — Z79899 Other long term (current) drug therapy: Secondary | ICD-10-CM | POA: Diagnosis not present

## 2017-06-15 DIAGNOSIS — F419 Anxiety disorder, unspecified: Secondary | ICD-10-CM | POA: Diagnosis not present

## 2017-06-15 DIAGNOSIS — R45851 Suicidal ideations: Secondary | ICD-10-CM | POA: Diagnosis not present

## 2017-06-15 DIAGNOSIS — R450 Nervousness: Secondary | ICD-10-CM | POA: Diagnosis not present

## 2017-06-15 DIAGNOSIS — F17210 Nicotine dependence, cigarettes, uncomplicated: Secondary | ICD-10-CM | POA: Insufficient documentation

## 2017-06-15 LAB — CBC
HEMATOCRIT: 46.1 % (ref 39.0–52.0)
Hemoglobin: 16.5 g/dL (ref 13.0–17.0)
MCH: 34.2 pg — AB (ref 26.0–34.0)
MCHC: 35.8 g/dL (ref 30.0–36.0)
MCV: 95.6 fL (ref 78.0–100.0)
Platelets: 129 10*3/uL — ABNORMAL LOW (ref 150–400)
RBC: 4.82 MIL/uL (ref 4.22–5.81)
RDW: 13.4 % (ref 11.5–15.5)
WBC: 11.4 10*3/uL — ABNORMAL HIGH (ref 4.0–10.5)

## 2017-06-15 LAB — COMPREHENSIVE METABOLIC PANEL
ALT: 333 U/L — AB (ref 17–63)
AST: 311 U/L — AB (ref 15–41)
Albumin: 4.6 g/dL (ref 3.5–5.0)
Alkaline Phosphatase: 77 U/L (ref 38–126)
Anion gap: 17 — ABNORMAL HIGH (ref 5–15)
BUN: 8 mg/dL (ref 6–20)
CHLORIDE: 101 mmol/L (ref 101–111)
CO2: 17 mmol/L — AB (ref 22–32)
CREATININE: 0.75 mg/dL (ref 0.61–1.24)
Calcium: 8.9 mg/dL (ref 8.9–10.3)
GFR calc Af Amer: >60 mL/min (ref >60)
GFR calc non Af Amer: >60 mL/min (ref >60)
GLUCOSE: 84 mg/dL (ref 65–99)
Potassium: 3.7 mmol/L (ref 3.5–5.1)
SODIUM: 135 mmol/L (ref 135–145)
Total Bilirubin: 1.2 mg/dL (ref 0.3–1.2)
Total Protein: 8.4 g/dL — ABNORMAL HIGH (ref 6.5–8.1)

## 2017-06-15 LAB — I-STAT CHEM 8, ED
BUN: 6 mg/dL (ref 6–20)
CREATININE: 0.8 mg/dL (ref 0.61–1.24)
Calcium, Ion: 0.99 mmol/L — ABNORMAL LOW (ref 1.15–1.40)
Chloride: 106 mmol/L (ref 101–111)
Glucose, Bld: 72 mg/dL (ref 65–99)
HEMATOCRIT: 42 % (ref 39.0–52.0)
HEMOGLOBIN: 14.3 g/dL (ref 13.0–17.0)
Potassium: 4.6 mmol/L (ref 3.5–5.1)
Sodium: 139 mmol/L (ref 135–145)
TCO2: 21 mmol/L — AB (ref 22–32)

## 2017-06-15 LAB — RAPID URINE DRUG SCREEN, HOSP PERFORMED
Amphetamines: NOT DETECTED
Barbiturates: NOT DETECTED
Benzodiazepines: NOT DETECTED
Cocaine: POSITIVE — AB
OPIATES: NOT DETECTED
TETRAHYDROCANNABINOL: NOT DETECTED

## 2017-06-15 LAB — SALICYLATE LEVEL

## 2017-06-15 LAB — ACETAMINOPHEN LEVEL: Acetaminophen (Tylenol), Serum: <10 ug/mL — ABNORMAL LOW (ref 10–30)

## 2017-06-15 LAB — ETHANOL: Alcohol, Ethyl (B): 230 mg/dL — ABNORMAL HIGH (ref <10)

## 2017-06-15 MED ORDER — TRIHEXYPHENIDYL HCL 5 MG PO TABS
5.0000 mg | ORAL_TABLET | Freq: Three times a day (TID) | ORAL | Status: DC
  Administered 2017-06-15 – 2017-06-17 (×6): 5 mg via ORAL
  Filled 2017-06-15 (×6): qty 1

## 2017-06-15 MED ORDER — PALIPERIDONE ER 6 MG PO TB24: 6.0000 mg | ORAL_TABLET | Freq: Every day | ORAL | Status: DC

## 2017-06-15 MED ORDER — GABAPENTIN 100 MG PO CAPS
200.0000 mg | ORAL_CAPSULE | Freq: Two times a day (BID) | ORAL | Status: DC
  Administered 2017-06-15: 200 mg via ORAL
  Filled 2017-06-15: qty 2

## 2017-06-15 MED ORDER — GABAPENTIN 300 MG PO CAPS
300.0000 mg | ORAL_CAPSULE | Freq: Two times a day (BID) | ORAL | Status: DC
  Administered 2017-06-15 – 2017-06-17 (×4): 300 mg via ORAL
  Filled 2017-06-15 (×4): qty 1

## 2017-06-15 MED ORDER — LORAZEPAM 1 MG PO TABS
0.0000 mg | ORAL_TABLET | Freq: Four times a day (QID) | ORAL | Status: AC
  Administered 2017-06-15: 4 mg via ORAL
  Administered 2017-06-16: 2 mg via ORAL
  Administered 2017-06-16 (×2): 1 mg via ORAL
  Filled 2017-06-15: qty 1
  Filled 2017-06-15: qty 2
  Filled 2017-06-15: qty 1
  Filled 2017-06-15: qty 4

## 2017-06-15 MED ORDER — HYDROXYZINE HCL 25 MG PO TABS
25.0000 mg | ORAL_TABLET | Freq: Four times a day (QID) | ORAL | Status: DC | PRN
  Administered 2017-06-15: 25 mg via ORAL
  Filled 2017-06-15 (×2): qty 1

## 2017-06-15 MED ORDER — ESCITALOPRAM OXALATE 10 MG PO TABS
15.0000 mg | ORAL_TABLET | Freq: Every day | ORAL | Status: DC
  Administered 2017-06-15: 15 mg via ORAL
  Filled 2017-06-15: qty 2

## 2017-06-15 MED ORDER — LORAZEPAM 2 MG/ML IJ SOLN: 0.0000 mg | Freq: Two times a day (BID) | INTRAMUSCULAR | Status: DC

## 2017-06-15 MED ORDER — PALIPERIDONE ER 6 MG PO TB24
6.0000 mg | ORAL_TABLET | Freq: Every day | ORAL | Status: DC
  Administered 2017-06-15 – 2017-06-17 (×3): 6 mg via ORAL
  Filled 2017-06-15 (×4): qty 1

## 2017-06-15 MED ORDER — LORAZEPAM 1 MG PO TABS: 0.0000 mg | ORAL_TABLET | Freq: Two times a day (BID) | ORAL | Status: DC

## 2017-06-15 MED ORDER — VITAMIN B-1 100 MG PO TABS
100.0000 mg | ORAL_TABLET | Freq: Every day | ORAL | Status: DC
  Administered 2017-06-15 – 2017-06-17 (×3): 100 mg via ORAL
  Filled 2017-06-15 (×3): qty 1

## 2017-06-15 MED ORDER — TRAZODONE HCL 100 MG PO TABS
200.0000 mg | ORAL_TABLET | Freq: Every day | ORAL | Status: DC
  Administered 2017-06-15 – 2017-06-16 (×2): 200 mg via ORAL
  Filled 2017-06-15 (×2): qty 2

## 2017-06-15 MED ORDER — SODIUM CHLORIDE 0.9 % IV BOLUS
2000.0000 mL | Freq: Once | INTRAVENOUS | Status: AC
Start: 2017-06-15 — End: 2017-06-15
  Administered 2017-06-15: 2000 mL via INTRAVENOUS

## 2017-06-15 MED ORDER — PALIPERIDONE PALMITATE 156 MG/ML IM SUSP: 117.0000 mg | INTRAMUSCULAR | Status: DC

## 2017-06-15 MED ORDER — LORAZEPAM 2 MG/ML IJ SOLN: 0.0000 mg | Freq: Four times a day (QID) | INTRAMUSCULAR | Status: AC

## 2017-06-15 MED ORDER — NICOTINE 21 MG/24HR TD PT24
21.0000 mg | MEDICATED_PATCH | Freq: Every day | TRANSDERMAL | Status: DC
  Administered 2017-06-15 – 2017-06-17 (×3): 21 mg via TRANSDERMAL
  Filled 2017-06-15 (×3): qty 1

## 2017-06-15 MED ORDER — THIAMINE HCL 100 MG/ML IJ SOLN: 100.0000 mg | Freq: Every day | INTRAMUSCULAR | Status: DC

## 2017-06-15 MED ORDER — TRAZODONE HCL 100 MG PO TABS: 200.0000 mg | ORAL_TABLET | Freq: Every evening | ORAL | Status: DC | PRN

## 2017-06-15 NOTE — BH Assessment (Signed)
Assessment Note  Leonard Guerrero is an 42 y.o. male who presented in the [facility] stating that he is hearing voices telling him that people are trying to hurt him and he states that he is convinced that his roommates are poisoning his food because he states that when he eats it that he gets sick.  Patient states that he is also suicidal and that he wants to blow his brains out.  He states that he does not have a gun, but he could get one.  He states: "I wish people would leave me alone and let me do what I need to do."  Patient states that he has no family, his medications do not work, he is not sleeping.  He states that he has made several attempts to kill himself in the past and states that he was hospitalized at [facility] on two occasions in the past year for the same.  Patient states that he is having thoughts about hurting others "anyway I can," but he did not identify anyone specific or how he would hurt them specifically.   Patient presented as alert and oriented.  He was cooperative, his speech was clear and his thoughts mostly organized.  He had good eye contact and his memory appeared to be intact.  His motor activity was not normal and he was highly anxious.  Patient states that he has not been sleeping or eating and states that he has recently lost 21 lbs in the past few months.  He states that he has been drinking three forty ounce beers daily and his last use was today, but he did not appear to be in any alcohol withdrawal. He denied any other drug use. Patient states that he has been compliant with taking his medications for his mental illness, but he states that Vesta Mixer keeps changing his medications around and they are not effective.  He states that he would like to come into the hospital to get stabilized.  Patient states that he has been married on two occasions, but he is divorced.  He has six children.  He states that he is disabled and unable to work.He states that he has no current legal involvement  and he is not on probation. Diagnosis: F25 Schizoaffective Disorder Bipolar Type  Past Medical History:  Past Medical History:  Diagnosis Date  . Bipolar 1 disorder (HCC)   . ETOH abuse   . Hypertension   . Paranoid schizophrenia [facility])     Past Surgical History:  Procedure Laterality Date  . DENTAL SURGERY     complete extraction    Family History:  Family History  Problem Relation Age of Onset  . Mental illness Father   . Heart attack Father   . Hypertension Mother   . Mental illness Sister     Social History:  reports that he has been smoking cigarettes.  He has a 10.00 pack-year smoking history. He has never used smokeless tobacco. He reports that he drinks about 1.8 - 2.4 oz of alcohol per week. His drug history is not on file.  Additional Social History:  Alcohol / Drug Use Pain Medications: denies Prescriptions: denies Over the Counter: denies History of alcohol / drug use?: Yes Longest period of sobriety (when/how long): unknown Substance #1 Name of Substance 1: alcohol 1 - Age of First Use: 12 1 - Amount (size/oz): 3 forties 1 - Frequency: daily 1 - Duration: unknown 1 - Last Use / Amount: this morning  CIWA: CIWA-Ar BP: Marland Kitchen)  141/88 Pulse Rate: 86 Nausea and Vomiting: no nausea and no vomiting Tactile Disturbances: none Tremor: two Auditory Disturbances: not present Paroxysmal Sweats: no sweat visible Visual Disturbances: not present Anxiety: two Headache, Fullness in Head: none present Agitation: two Orientation and Clouding of Sensorium: oriented and can do serial additions CIWA-Ar Total: 6 COWS:    Allergies: No Known Allergies  Home Medications:  (Not in a hospital admission)  OB/GYN Status:  No LMP for male patient.  General Assessment Data Location of Assessment: [facility] ED TTS Assessment: In system Is this a Tele or Face-to-Face Assessment?: Face-to-Face Is this an Initial Assessment or a Re-assessment for this encounter?: Initial  Assessment Marital status: Single Living Arrangements: Non-relatives/Friends Can pt return to current living arrangement?: Yes Admission Status: Involuntary Is patient capable of signing voluntary admission?: Yes Referral Source: Self/Family/Friend Insurance type: medcaid(medicaid)     Crisis Care Plan Living Arrangements: Non-relatives/Friends Legal Guardian: Other:(self) Name of Psychiatrist: Vesta Mixer) Name of Therapist: Transport planner  Education Status Is patient currently in school?: No Is the patient employed, unemployed or receiving disability?: Receiving disability income  Risk to self with the past 6 months Suicidal Ideation: Yes-Currently Present Has patient been a risk to self within the past 6 months prior to admission? : No Suicidal Intent: Yes-Currently Present Has patient had any suicidal intent within the past 6 months prior to admission? : No Is patient at risk for suicide?: Yes Suicidal Plan?: Yes-Currently Present Has patient had any suicidal plan within the past 6 months prior to admission? : No Specify Current Suicidal Plan: (blow his brains out) Access to Means: (says he can get a gun) What has been your use of drugs/alcohol within the last 12 months?: (drinks daily) Previous Attempts/Gestures: Yes How many times?: (multiple) Other Self Harm Risks: unstable on medications and minimal support Triggers for Past Attempts: Unknown Intentional Self Injurious Behavior: None Family Suicide History: Unknown Recent stressful life event(s): Conflict (Comment)(conflict with roommates) Persecutory voices/beliefs?: Yes Depression: Yes Depression Symptoms: Despondent, Insomnia, Isolating, Loss of interest in usual pleasures, Feeling worthless/self pity Substance abuse history and/or treatment for substance abuse?: Yes Suicide prevention information given to non-admitted patients: Not applicable  Risk to Others within the past 6 months Homicidal Ideation: Yes-Currently  Present Does patient have any lifetime risk of violence toward others beyond the six months prior to admission? : No Thoughts of Harm to Others: Yes-Currently Present Comment - Thoughts of Harm to Others: hurt people anyway he can Current Homicidal Intent: No Current Homicidal Plan: No Access to Homicidal Means: No Identified Victim: people in general public History of harm to others?: No Assessment of Violence: None Noted Violent Behavior Description: (none reported) Does patient have access to weapons?: No(none currently in home) Criminal Charges Pending?: No Does patient have a court date: No Is patient on probation?: No  Psychosis Hallucinations: Auditory(voices telling him people are out to get him) Delusions: Unspecified(thinks roommates are poisoning him)  Mental Status Report Appearance/Hygiene: Disheveled Eye Contact: Good Motor Activity: Unremarkable Speech: Pressured Level of Consciousness: Alert Mood: Depressed, Anxious, Suspicious, Apathetic Affect: Flat Anxiety Level: Moderate Thought Processes: Coherent Judgement: Impaired Orientation: Person, Place, Time, Situation Obsessive Compulsive Thoughts/Behaviors: Minimal  Cognitive Functioning Concentration: Decreased Memory: Recent Intact, Remote Intact Is patient IDD: No Is patient DD?: No Insight: Fair Impulse Control: Fair Appetite: Poor Have you had any weight changes? : Loss Amount of the weight change? (lbs): 21 lbs Sleep: Decreased Total Hours of Sleep: (states that he is not sleeping)  ADLScreening [facility]  Assessment Services) Patient's cognitive ability adequate to safely complete daily activities?: Yes Patient able to express need for assistance with ADLs?: Yes Independently performs ADLs?: Yes (appropriate for developmental age)  Prior Inpatient Therapy Prior Inpatient Therapy: Yes Prior Therapy Dates: 2018 x 2 Prior Therapy Facilty/Provider(s): [facility]) Reason for Treatment: (depression and  psychosis)  Prior Outpatient Therapy Prior Outpatient Therapy: Yes Prior Therapy Dates: (active) Prior Therapy Facilty/Provider(s): [facility]) Reason for Treatment: (suicidal) Does patient have an ACCT team?: No Does patient have Intensive In-House Services?  : No Does patient have [facility] services? : Yes Does patient have P4CC services?: No  ADL Screening (condition at time of admission) Patient's cognitive ability adequate to safely complete daily activities?: Yes Is the patient deaf or have difficulty hearing?: No Does the patient have difficulty seeing, even when wearing glasses/contacts?: No Does the patient have difficulty concentrating, remembering, or making decisions?: No Patient able to express need for assistance with ADLs?: Yes Does the patient have difficulty dressing or bathing?: No Independently performs ADLs?: Yes (appropriate for developmental age) Does the patient have difficulty walking or climbing stairs?: No Weakness of Legs: None Weakness of Arms/Hands: None       Abuse/Neglect Assessment (Assessment to be complete while patient is alone) Abuse/Neglect Assessment Can Be Completed: Yes Physical Abuse: Denies Verbal Abuse: Denies Exploitation of patient/patient's resources: Denies Self-Neglect: Denies Values / Beliefs Cultural Requests During Hospitalization: None Spiritual Requests During Hospitalization: None Consults Spiritual Care Consult Needed: No Social Work Consult Needed: No Merchant navy officer (For Healthcare) Does Patient Have a Medical Advance Directive?: No Would patient like information on creating a medical advance directive?: No - Patient declined    Additional Information 1:1 In Past 12 Months?: No CIRT Risk: No Elopement Risk: No Does patient have medical clearance?: Yes     Disposition:  Per Dr Jannifer Franklin, Inpatient Treatment is recommended for a [facility] 500 hall bed when one is available/patient can be referred to outside facilities  as well. Disposition Initial Assessment Completed for this Encounter: Yes Disposition of Patient: Admit Type of inpatient treatment program: Adult  On Site Evaluation by:   Reviewed with Physician:    Arnoldo Lenis Kamran Coker 06/15/2017 1:02 PM

## 2017-06-15 NOTE — ED Notes (Signed)
Pharm attempting to verify meds and will contact ED when they have done so.

## 2017-06-15 NOTE — ED Notes (Signed)
Pt moved to Secure Unit Rm 35.  Pt has tremors and hands are shaking. He stated he drinks three to four  40 oz beers daily. Last was 0500 today.  He also uses Cocaine when available. Last use 0500 " large amount".  Pt cooperative at this time, asking for more food. Sts he is not homeless

## 2017-06-15 NOTE — ED Notes (Signed)
Pt has been sleeping since he arrived.  Held Ativan dose due at [facility]

## 2017-06-15 NOTE — ED Notes (Signed)
Patient asked by writer if he would like to be admitted to [facility] at this time. Pt states "No, I don't want to go over there again right now".

## 2017-06-15 NOTE — ED Notes (Signed)
Pt called for triage no response ?

## 2017-06-15 NOTE — ED Notes (Signed)
Pt called for triage no response x3 °

## 2017-06-15 NOTE — ED Notes (Signed)
Pt has two bags of belongings. Both bags placed in locker 35.

## 2017-06-15 NOTE — ED Notes (Signed)
Patient anxious and withdrawn, but remains cooperative with care. Pt denies any thoughts of suicide or plans to harm himself. Pt with noted tremors at this time. Patient compliant with medications. No distress noted.

## 2017-06-15 NOTE — ED Provider Notes (Signed)
[facility]-EMERGENCY DEPT Provider Note   CSN: [id number] Arrival date & time: 06/15/17  2130     History   Chief Complaint Chief Complaint  Patient presents with  . Mental Health Problem  . Hallucinations    HPI Rochelle Nephew is a 42 y.o. male.  Patient complains of hearing voices for the past 1 or 2 months.  The voices stating that his roommates are trying to kill him.  Patient also expresses suicidal ideation.  He reports feeling suicidal daily .he reports he would kill himself by cutting his wrist.  He has been off of his medications for "a couple of months" he admits to using alcohol last time at 4 AM today. He eloped from the emergency department prior to my seeing him and was retrieved by police. HPI  Past Medical History:  Diagnosis Date  . Bipolar 1 disorder (HCC)   . ETOH abuse   . Hypertension   . Paranoid schizophrenia [facility])     Patient Active Problem List   Diagnosis Date Noted  . Schizophrenia (HCC) 10/23/2016  . 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 Elevated TSH 06/18/2016  . Paranoid schizophrenia (HCC) 06/17/2016  . Major depressive disorder with single episode 06/17/2016  . Alcohol use disorder, severe, dependence (HCC) 06/17/2016  . Cocaine use disorder, moderate, dependence (HCC) 06/17/2016  . Alcoholic intoxication without complication (HCC)   . Acute encephalopathy 06/08/2016  . Acute respiratory failure (HCC)   . Altered mental status   . Encephalopathy acute 06/07/2016  . Chest pain 04/04/2015    Past Surgical History:  Procedure Laterality Date  . DENTAL SURGERY     complete extraction        Home Medications    Prior to Admission medications   Medication Sig Start Date End Date Taking? Authorizing Provider  escitalopram (LEXAPRO) 5 MG tablet Take 3 tablets (15 mg total) by mouth daily. 10/30/16   Withrow, Everardo All, FNP  gabapentin (NEURONTIN) 100 MG capsule Take 2  capsules (200 mg total) by mouth 2 (two) times daily. 10/29/16   Withrow, Everardo All, FNP  hydrOXYzine (ATARAX/VISTARIL) 25 MG tablet Take 1 tablet (25 mg total) by mouth every 6 (six) hours as needed for anxiety. 10/29/16   Withrow, Everardo All, FNP  nicotine (NICODERM CQ - DOSED IN MG/24 HOURS) 21 mg/24hr patch Place 1 patch (21 mg total) onto the skin daily at 6 (six) AM. 10/29/16   Withrow, Everardo All, FNP  paliperidone (INVEGA SUSTENNA) 156 MG/ML SUSP injection Inject 0.75 mLs (117 mg total) into the muscle every 28 (twenty-eight) days. 11/20/16   Withrow, Everardo All, FNP  paliperidone (INVEGA) 6 MG 24 hr tablet Take 1 tablet (6 mg total) by mouth daily. 10/30/16   Withrow, Everardo All, FNP  traZODone (DESYREL) 100 MG tablet Take 2 tablets (200 mg total) by mouth at bedtime as needed for sleep. 10/29/16   Withrow, Everardo All, FNP  trihexyphenidyl (ARTANE) 5 MG tablet Take 1 tablet (5 mg total) by mouth 3 (three) times daily with meals. 10/29/16   Withrow, Everardo All, FNP    Family History Family History  Problem Relation Age of Onset  . Mental illness Father   . Heart attack Father   . Hypertension Mother   . Mental illness Sister     Social History Social History   Tobacco Use  . Smoking status: Current Every Day Smoker    Packs/day: 1.00    Years: 10.00  Pack years: 10.00    Types: Cigarettes  . Smokeless tobacco: Never Used  Substance Use Topics  . Alcohol use: Yes    Alcohol/week: 1.8 - 2.4 oz    Types: 3 - 4 Cans of beer per week  . Drug use: Not on file     Allergies   Patient has no known allergies.   Review of Systems Review of Systems  Unable to perform ROS: Psychiatric disorder  Psychiatric/Behavioral: Positive for hallucinations and suicidal ideas.     Physical Exam Updated Vital Signs BP (!) 148/97 (BP Location: Left Arm)   Pulse 85   Temp 98.3 F (36.8 C) (Oral)   Resp 18   SpO2 94%   Physical Exam  Constitutional:  Unkempt  HENT:  Head: Normocephalic and atraumatic.    Eyes: Pupils are equal, round, and reactive to light. Conjunctivae are normal.  Neck: Neck supple. No tracheal deviation present. No thyromegaly present.  Cardiovascular: Normal rate and regular rhythm.  No murmur heard. Pulmonary/Chest: Effort normal and breath sounds normal.  Abdominal: Soft. Bowel sounds are normal. He exhibits no distension. There is no tenderness.  Musculoskeletal: Normal range of motion. He exhibits no edema or tenderness.  Neurological: He is alert. No cranial nerve deficit. Coordination normal.  Cranial nerves 2 through 12 grossly intact.  Gait normal  Skin: Skin is warm and dry. No rash noted.  Psychiatric: He has a normal mood and affect.  Nursing note and vitals reviewed.    ED Treatments / Results  Labs (all labs ordered are listed, but only abnormal results are displayed) Labs Reviewed  COMPREHENSIVE METABOLIC PANEL  ETHANOL  SALICYLATE LEVEL  ACETAMINOPHEN LEVEL  CBC  RAPID URINE DRUG SCREEN, HOSP PERFORMED    EKG None  Radiology No results found.  Procedures Procedures (including critical care time)  Medications Ordered in ED Medications  escitalopram (LEXAPRO) tablet 15 mg (has no administration in time range)  gabapentin (NEURONTIN) capsule 200 mg (has no administration in time range)  hydrOXYzine (ATARAX/VISTARIL) tablet 25 mg (has no administration in time range)  nicotine (NICODERM CQ - dosed in mg/24 hours) patch 21 mg (has no administration in time range)  paliperidone (INVEGA SUSTENNA) injection 117 mg (has no administration in time range)  paliperidone (INVEGA) 24 hr tablet 6 mg (has no administration in time range)  traZODone (DESYREL) tablet 200 mg (has no administration in time range)  trihexyphenidyl (ARTANE) tablet 5 mg (has no administration in time range)   Results for orders placed or performed during the hospital encounter of 06/15/17  Comprehensive metabolic panel  Result Value Ref Range   Sodium 135 135 - 145  mmol/L   Potassium 3.7 3.5 - 5.1 mmol/L   Chloride 101 101 - 111 mmol/L   CO2 17 (L) 22 - 32 mmol/L   Glucose, Bld 84 65 - 99 mg/dL   BUN 8 6 - 20 mg/dL   Creatinine, Ser 4.69 0.61 - 1.24 mg/dL   Calcium 8.9 8.9 - 62.9 mg/dL   Total Protein 8.4 (H) 6.5 - 8.1 g/dL   Albumin 4.6 3.5 - 5.0 g/dL   AST 528 (H) 15 - 41 U/L   ALT 333 (H) 17 - 63 U/L   Alkaline Phosphatase 77 38 - 126 U/L   Total Bilirubin 1.2 0.3 - 1.2 mg/dL   GFR calc non Af Amer >60 >60 mL/min   GFR calc Af Amer >60 >60 mL/min   Anion gap 17 (H) 5 - 15  Ethanol  Result Value Ref Range   Alcohol, Ethyl (B) 230 (H) <10 mg/dL  Salicylate level  Result Value Ref Range   Salicylate Lvl <7.0 2.8 - 30.0 mg/dL  Acetaminophen level  Result Value Ref Range   Acetaminophen (Tylenol), Serum <10 (L) 10 - 30 ug/mL  cbc  Result Value Ref Range   WBC 11.4 (H) 4.0 - 10.5 K/uL   RBC 4.82 4.22 - 5.81 MIL/uL   Hemoglobin 16.5 13.0 - 17.0 g/dL   HCT 16.1 09.6 - 04.5 %   MCV 95.6 78.0 - 100.0 fL   MCH 34.2 (H) 26.0 - 34.0 pg   MCHC 35.8 30.0 - 36.0 g/dL   RDW 40.9 81.1 - 91.4 %   Platelets 129 (L) 150 - 400 K/uL  Rapid urine drug screen (hospital performed)  Result Value Ref Range   Opiates NONE DETECTED NONE DETECTED   Cocaine POSITIVE (A) NONE DETECTED   Benzodiazepines NONE DETECTED NONE DETECTED   Amphetamines NONE DETECTED NONE DETECTED   Tetrahydrocannabinol NONE DETECTED NONE DETECTED   Barbiturates NONE DETECTED NONE DETECTED  I-stat chem 8, ed  Result Value Ref Range   Sodium 139 135 - 145 mmol/L   Potassium 4.6 3.5 - 5.1 mmol/L   Chloride 106 101 - 111 mmol/L   BUN 6 6 - 20 mg/dL   Creatinine, Ser 7.82 0.61 - 1.24 mg/dL   Glucose, Bld 72 65 - 99 mg/dL   Calcium, Ion 9.56 (L) 1.15 - 1.40 mmol/L   TCO2 21 (L) 22 - 32 mmol/L   Hemoglobin 14.3 13.0 - 17.0 g/dL   HCT 21.3 08.6 - 57.8 %   No results found.  Initial Impression / Assessment and Plan / ED Course  I have reviewed the triage vital signs and the  nursing notes. Patient was involuntarily committed by me.  IVC affidavit and first exam form filled out by me Pertinent labs & imaging results that were available during my care of the patient were reviewed by me and considered in my medical decision making (see chart for details).   Initial lab work consistent with elevated anion gap likely secondary to alcohol.  Patient's anion gap is normalized after intravenous hydration.  Patient is medically clear for psychiatric evaluation  12:25 PM patient is alert Glasgow Coma Score 15  Final Clinical Impressions(s) / ED Diagnoses  #1 auditory hallucinations #2 suicidal ideation  #3 polysubstance abuse  Final diagnoses:  None    ED Discharge Orders    None       Doug Sou, MD 06/15/17 1229

## 2017-06-15 NOTE — ED Notes (Signed)
Patient asleep with no signs of distress at this time. Respirations regular and unlabored.

## 2017-06-15 NOTE — ED Triage Notes (Addendum)
Pt brought in by police and states he couldn't find his way home. Pt c/o hearing voices and feels that his meds are causing him to experience mental health problems. Pt calm and cooperative. Pt states the voices are telling him to hurt his roommates because he feels they are trying to poison him. He states he just wants to die because he is tired of living with the voices. No plan for suicide. Pt admits to alcohol.

## 2017-06-15 NOTE — ED Notes (Signed)
Bed: WTR6 Expected date:  Expected time:  Means of arrival:  Comments: 

## 2017-06-15 NOTE — ED Notes (Signed)
Bed: WBH35 Expected date:  Expected time:  Means of arrival:  Comments: 

## 2017-06-15 NOTE — ED Notes (Signed)
Unable to obtain pt vital signs, will attempt again before the end of shift.

## 2017-06-16 ENCOUNTER — Other Ambulatory Visit: Payer: Self-pay

## 2017-06-16 DIAGNOSIS — R45850 Homicidal ideations: Secondary | ICD-10-CM

## 2017-06-16 DIAGNOSIS — Z818 Family history of other mental and behavioral disorders: Secondary | ICD-10-CM | POA: Diagnosis not present

## 2017-06-16 DIAGNOSIS — R4587 Impulsiveness: Secondary | ICD-10-CM

## 2017-06-16 DIAGNOSIS — F1490 Cocaine use, unspecified, uncomplicated: Secondary | ICD-10-CM | POA: Diagnosis not present

## 2017-06-16 DIAGNOSIS — F1099 Alcohol use, unspecified with unspecified alcohol-induced disorder: Secondary | ICD-10-CM

## 2017-06-16 DIAGNOSIS — R450 Nervousness: Secondary | ICD-10-CM

## 2017-06-16 DIAGNOSIS — F419 Anxiety disorder, unspecified: Secondary | ICD-10-CM | POA: Diagnosis not present

## 2017-06-16 DIAGNOSIS — F17210 Nicotine dependence, cigarettes, uncomplicated: Secondary | ICD-10-CM | POA: Diagnosis not present

## 2017-06-16 DIAGNOSIS — F251 Schizoaffective disorder, depressive type: Secondary | ICD-10-CM | POA: Diagnosis not present

## 2017-06-16 DIAGNOSIS — Y907 Blood alcohol level of 200-239 mg/100 ml: Secondary | ICD-10-CM

## 2017-06-16 NOTE — ED Notes (Signed)
Patient denies SI/AVH but endorses homicidal ideation toward his roommates because he believes they are poisoning his food. Plan of care discussed. Encouragement and support provided and safety maintain. Q 15 min safety checks remain in place and video monitoring.

## 2017-06-16 NOTE — Consult Note (Addendum)
[place] [facility] Consult   Reason for Consult:  Homicidal ideation Referring Physician:  EDP Patient Identification: Leonard Guerrero MRN:  [id number] Principal Diagnosis: Schizoaffective disorder, depressive type [facility]) Diagnosis:   Patient Active Problem List   Diagnosis Date Noted  . Schizoaffective disorder, depressive type ([place]) [F25.1] 06/15/2017  . Schizophrenia ([place]) [F20.9] 10/23/2016  . MDD (major depressive disorder), recurrent, severe, with psychosis ([place]) [F33.3] 10/23/2016  . Elevated TSH [R79.89] 06/18/2016  . Paranoid schizophrenia ([place]) [F20.0] 06/17/2016  . Major depressive disorder with single episode [F32.9] 06/17/2016  . Alcohol use disorder, severe, dependence ([place]) [F10.20] 06/17/2016  . Cocaine use disorder, moderate, dependence ([place]) [F14.20] 06/17/2016  . Alcoholic intoxication without complication ([place]) [H06.237]   . Acute encephalopathy [G93.40] 06/08/2016  . Acute respiratory failure ([place]) [J96.00]   . Altered mental status [R41.82]   . Encephalopathy acute [G93.40] 06/07/2016  . Chest pain [R07.9] 04/04/2015    Total Time spent with patient: 45 minutes  Subjective:   Leonard Guerrero is a 42 y.o. male patient admitted with homicidal ideation.  HPI:  Pt was seen and chart reviewed with treatment team and Dr Mariea Clonts.  Pt denies suicidal ideation, denies auditory/visual hallucinations and does not appear to be responding to internal stimuli. Pt endorses homicidal ideation toward his roommates because he believes they are poisoning his food. Pt stated he lives with two roommates. Pt stated he goes to [place] and gets a monthly [place] injection. Pt stated he last had an injection on 06/05/2017. Pt's BAL 230 and UDS positive for cocaine. Pt will be monitored overnight for safety and Invega injection will be verified for last given date. Call placed to [facility] at [phone] for verification. Pt will receive injection while in the ED if it is past due.    Past Psychiatric History: As above  Risk to Self: None. Denies SI.  Risk to Others: Homicidal Ideation: Yes-Currently Present Thoughts of Harm to Others: Yes-Currently Present Comment - Thoughts of Harm to Others: hurt people anyway he can Current Homicidal Intent: No Current Homicidal Plan: No Access to Homicidal Means: No Identified Victim: people in general public History of harm to others?: No Assessment of Violence: None Noted Violent Behavior Description: (none reported) Does patient have access to weapons?: No(none currently in home) Criminal Charges Pending?: No Does patient have a court date: No Prior Inpatient Therapy: Prior Inpatient Therapy: Yes Prior Therapy Dates: 2018 x 2 Prior Therapy Facilty/Provider(s): [facility]) Reason for Treatment: (depression and psychosis) Prior Outpatient Therapy: Prior Outpatient Therapy: Yes Prior Therapy Dates: (active) Prior Therapy Facilty/Provider(s): [facility]) Reason for Treatment: (suicidal) Does patient have an ACCT team?: No Does patient have Intensive In-House Services?  : No Does patient have [facility] services? : Yes Does patient have P4CC services?: No  Past Medical History:  Past Medical History:  Diagnosis Date  . Bipolar 1 disorder ([place])   . ETOH abuse   . Hypertension   . Paranoid schizophrenia [facility])     Past Surgical History:  Procedure Laterality Date  . DENTAL SURGERY     complete extraction   Family History:  Family History  Problem Relation Age of Onset  . Mental illness Father   . Heart attack Father   . Hypertension Mother   . Mental illness Sister    Family Psychiatric  History: As stated above.  Social History:  Social History   Substance and Sexual Activity  Alcohol Use Yes  . Alcohol/week: 1.8 - 2.4 oz  . Types: 3 - 4  Cans of beer per week     Social History   Substance and Sexual Activity  Drug Use Not on file    Social History   Socioeconomic History  . Marital status: Single     Spouse name: Not on file  . Number of children: Not on file  . Years of education: Not on file  . Highest education level: Not on file  Occupational History  . Not on file  Social Needs  . Financial resource strain: Not on file  . Food insecurity:    Worry: Not on file    Inability: Not on file  . Transportation needs:    Medical: Not on file    Non-medical: Not on file  Tobacco Use  . Smoking status: Current Every Day Smoker    Packs/day: 1.00    Years: 10.00    Pack years: 10.00    Types: Cigarettes  . Smokeless tobacco: Never Used  Substance and Sexual Activity  . Alcohol use: Yes    Alcohol/week: 1.8 - 2.4 oz    Types: 3 - 4 Cans of beer per week  . Drug use: Not on file  . Sexual activity: Not on file  Lifestyle  . Physical activity:    Days per week: Not on file    Minutes per session: Not on file  . Stress: Not on file  Relationships  . Social connections:    Talks on phone: Not on file    Gets together: Not on file    Attends religious service: Not on file    Active member of club or organization: Not on file    Attends meetings of clubs or organizations: Not on file    Relationship status: Not on file  Other Topics Concern  . Not on file  Social History Narrative   ** Merged History Encounter **       Additional Social History: N/A    Allergies:  No Known Allergies  Labs:  Results for orders placed or performed during the hospital encounter of 06/15/17 (from the past 48 hour(s))  Comprehensive metabolic panel     Status: Abnormal   Collection Time: 06/15/17  9:06 AM  Result Value Ref Range   Sodium 135 135 - 145 mmol/L   Potassium 3.7 3.5 - 5.1 mmol/L   Chloride 101 101 - 111 mmol/L   CO2 17 (L) 22 - 32 mmol/L   Glucose, Bld 84 65 - 99 mg/dL   BUN 8 6 - 20 mg/dL   Creatinine, Ser 0.75 0.61 - 1.24 mg/dL   Calcium 8.9 8.9 - 10.3 mg/dL   Total Protein 8.4 (H) 6.5 - 8.1 g/dL   Albumin 4.6 3.5 - 5.0 g/dL   AST 311 (H) 15 - 41 U/L   ALT 333 (H) 17  - 63 U/L   Alkaline Phosphatase 77 38 - 126 U/L   Total Bilirubin 1.2 0.3 - 1.2 mg/dL   GFR calc non Af Amer >60 >60 mL/min   GFR calc Af Amer >60 >60 mL/min    Comment: (NOTE) The eGFR has been calculated using the CKD EPI equation. This calculation has not been validated in all clinical situations. eGFR's persistently <60 mL/min signify possible Chronic Kidney Disease.    Anion gap 17 (H) 5 - 15    Comment: Performed at [facility], [place] [address], [place], [place] [address]  Ethanol     Status: Abnormal   Collection Time: 06/15/17  9:06 AM  Result Value Ref Range   Alcohol, Ethyl (B) 230 (H) <10 mg/dL    Comment:        LOWEST DETECTABLE LIMIT FOR SERUM ALCOHOL IS 10 mg/dL FOR MEDICAL PURPOSES ONLY Performed at [place] [address] [place], [place] [address]   Salicylate level     Status: None   Collection Time: 06/15/17  9:06 AM  Result Value Ref Range   Salicylate Lvl <4.9 2.8 - 30.0 mg/dL    Comment: Performed at [facility], [place] [address] [place], [place] [address]  Acetaminophen level     Status: Abnormal   Collection Time: 06/15/17  9:06 AM  Result Value Ref Range   Acetaminophen (Tylenol), Serum <10 (L) 10 - 30 ug/mL    Comment:        THERAPEUTIC CONCENTRATIONS VARY SIGNIFICANTLY. A RANGE OF 10-30 ug/mL MAY BE AN EFFECTIVE CONCENTRATION FOR MANY PATIENTS. HOWEVER, SOME ARE BEST TREATED AT CONCENTRATIONS OUTSIDE THIS RANGE. ACETAMINOPHEN CONCENTRATIONS >150 ug/mL AT 4 HOURS AFTER INGESTION AND >50 ug/mL AT 12 HOURS AFTER INGESTION ARE OFTEN ASSOCIATED WITH TOXIC REACTIONS. Performed at [facility], [place] [address], [place], [place] [address]   cbc     Status: Abnormal   Collection Time: 06/15/17  9:06 AM  Result Value Ref Range   WBC 11.4 (H) 4.0 - 10.5 K/uL   RBC 4.82 4.22 - 5.81 MIL/uL   Hemoglobin 16.5 13.0 - 17.0 g/dL   HCT 46.1 39.0 - 52.0 %   MCV 95.6 78.0 -  100.0 fL   MCH 34.2 (H) 26.0 - 34.0 pg   MCHC 35.8 30.0 - 36.0 g/dL   RDW 13.4 11.5 - 15.5 %   Platelets 129 (L) 150 - 400 K/uL    Comment: Performed at [facility], [place] [address] [place], [place] [address]  Rapid urine drug screen (hospital performed)     Status: Abnormal   Collection Time: 06/15/17  9:06 AM  Result Value Ref Range   Opiates NONE DETECTED NONE DETECTED   Cocaine POSITIVE (A) NONE DETECTED   Benzodiazepines NONE DETECTED NONE DETECTED   Amphetamines NONE DETECTED NONE DETECTED   Tetrahydrocannabinol NONE DETECTED NONE DETECTED   Barbiturates NONE DETECTED NONE DETECTED    Comment: (NOTE) DRUG SCREEN FOR MEDICAL PURPOSES ONLY.  IF CONFIRMATION IS NEEDED FOR ANY PURPOSE, NOTIFY LAB WITHIN 5 DAYS. LOWEST DETECTABLE LIMITS FOR URINE DRUG SCREEN Drug Class                     Cutoff (ng/mL) Amphetamine and metabolites    1000 Barbiturate and metabolites    200 Benzodiazepine                 254 Tricyclics and metabolites     300 Opiates and metabolites        300 Cocaine and metabolites        300 THC                            50 Performed at [facility], [place] [address], [place], [place] [address]   I-stat chem 8, ed     Status: Abnormal   Collection Time: 06/15/17 11:56 AM  Result Value Ref Range   Sodium 139 135 - 145 mmol/L   Potassium 4.6 3.5 - 5.1 mmol/L   Chloride 106 101 - 111 mmol/L   BUN 6  6 - 20 mg/dL   Creatinine, Ser 0.80 0.61 - 1.24 mg/dL   Glucose, Bld 72 65 - 99 mg/dL   Calcium, Ion 0.99 (L) 1.15 - 1.40 mmol/L   TCO2 21 (L) 22 - 32 mmol/L   Hemoglobin 14.3 13.0 - 17.0 g/dL   HCT 42.0 39.0 - 52.0 %    Current Facility-Administered Medications  Medication Dose Route Frequency Provider Last Rate Last Dose  . gabapentin (NEURONTIN) capsule 300 mg  300 mg Oral BID Akintayo, Mojeed, MD   300 mg at 06/16/17 0931  . hydrOXYzine (ATARAX/VISTARIL) tablet 25 mg  25 mg Oral Q6H PRN Orlie Dakin, MD   25 mg  at 06/15/17 2118  . LORazepam (ATIVAN) injection 0-4 mg  0-4 mg Intravenous Q6H Orlie Dakin, MD       Or  . LORazepam (ATIVAN) tablet 0-4 mg  0-4 mg Oral Q6H Jacubowitz, Sam, MD   2 mg at 06/16/17 1000  . [START ON 06/17/2017] LORazepam (ATIVAN) injection 0-4 mg  0-4 mg Intravenous Q12H Orlie Dakin, MD       Or  . Derrill Memo ON 06/17/2017] LORazepam (ATIVAN) tablet 0-4 mg  0-4 mg Oral Q12H Jacubowitz, Sam, MD      . nicotine (NICODERM CQ - dosed in mg/24 hours) patch 21 mg  21 mg Transdermal Q0600 Orlie Dakin, MD   21 mg at 06/16/17 1006  . paliperidone (INVEGA) 24 hr tablet 6 mg  6 mg Oral Daily Akintayo, Mojeed, MD   6 mg at 06/16/17 1016  . thiamine (VITAMIN B-1) tablet 100 mg  100 mg Oral Daily Winfred Leeds, Sam, MD   100 mg at 06/16/17 0930   Or  . thiamine (B-1) injection 100 mg  100 mg Intravenous Daily Orlie Dakin, MD      . traZODone (DESYREL) tablet 200 mg  200 mg Oral QHS Akintayo, Mojeed, MD   200 mg at 06/15/17 2117  . trihexyphenidyl (ARTANE) tablet 5 mg  5 mg Oral TID WC Orlie Dakin, MD   5 mg at 06/16/17 0930   Current Outpatient Medications  Medication Sig Dispense Refill  . escitalopram (LEXAPRO) 5 MG tablet Take 3 tablets (15 mg total) by mouth daily. 90 tablet 0  . gabapentin (NEURONTIN) 100 MG capsule Take 2 capsules (200 mg total) by mouth 2 (two) times daily. 120 capsule 0  . hydrOXYzine (ATARAX/VISTARIL) 25 MG tablet Take 1 tablet (25 mg total) by mouth every 6 (six) hours as needed for anxiety. 30 tablet 0  . Melatonin 5 MG TABS Take 5 mg by mouth at bedtime.    . nicotine (NICODERM CQ - DOSED IN MG/24 HOURS) 21 mg/24hr patch Place 1 patch (21 mg total) onto the skin daily at 6 (six) AM. 28 patch 0  . paliperidone (INVEGA SUSTENNA) 156 MG/ML SUSP injection Inject 0.75 mLs (117 mg total) into the muscle every 28 (twenty-eight) days. 0.9 mL 0  . paliperidone (INVEGA) 6 MG 24 hr tablet Take 1 tablet (6 mg total) by mouth daily. 30 tablet 0  . traZODone  (DESYREL) 100 MG tablet Take 2 tablets (200 mg total) by mouth at bedtime as needed for sleep. 60 tablet 0  . trihexyphenidyl (ARTANE) 5 MG tablet Take 1 tablet (5 mg total) by mouth 3 (three) times daily with meals. 90 tablet 0    Musculoskeletal: Strength & Muscle Tone: within normal limits Gait & Station: normal Patient leans: N/A  Psychiatric Specialty Exam: Physical Exam  Nursing note and vitals reviewed. Constitutional:  He is oriented to person, place, and time. He appears well-developed and well-nourished.  HENT:  Head: Normocephalic and atraumatic.  Neck: Normal range of motion.  Respiratory: Effort normal.  Musculoskeletal: Normal range of motion.  Neurological: He is alert and oriented to person, place, and time.  Psychiatric: His speech is normal and behavior is normal. His mood appears anxious. Cognition and memory are normal. He expresses impulsivity. He exhibits a depressed mood. He expresses homicidal ideation.    Review of Systems  Psychiatric/Behavioral: Positive for depression, hallucinations and substance abuse. Negative for memory loss and suicidal ideas. The patient is nervous/anxious. The patient does not have insomnia.   All other systems reviewed and are negative.   Blood pressure 129/68, pulse 70, temperature 98.9 F (37.2 C), temperature source Oral, resp. rate 12, SpO2 94 %.There is no height or weight on file to calculate BMI.  General Appearance: Disheveled  Eye Contact:  Good  Speech:  Clear and Coherent  Volume:  Decreased  Mood:  Anxious and Depressed  Affect:  Congruent and Depressed  Thought Process:  Coherent and Linear  Orientation:  Full (Time, Place, and Person)  Thought Content:  Hallucinations: Auditory Command:  to hurt people  Suicidal Thoughts:  No  Homicidal Thoughts:  Yes.  without intent/plan  Memory:  Immediate;   Good Recent;   Fair Remote;   Fair  Judgement:  Fair  Insight:  Shallow  Psychomotor Activity:  Normal   Concentration:  Concentration: Fair and Attention Span: Fair  Recall:  [employer] of Knowledge:  Good  Language:  Good  Akathisia:  No  Handed:  Right  AIMS (if indicated):   N/A  Assets:  Agricultural consultant Housing  ADL's:  Intact  Cognition:  WNL  Sleep:   N/A     Treatment Plan Summary: Daily contact with patient to assess and evaluate symptoms and progress in treatment and Medication management (see MAR)   Disposition: Observe overnight for medication efficacy and safety for possible discharge tomorrow  Ethelene Hal, NP 06/16/2017 12:03 PM   Patient seen face-to-face for psychiatric evaluation, chart reviewed and case discussed with the physician extender and developed treatment plan. Reviewed the information documented and agree with the treatment plan.  Buford Dresser, DO 06/16/17 5:35 PM

## 2017-06-16 NOTE — Progress Notes (Addendum)
Patient ID: Leonard Guerrero, male   DOB: Jun 17, 1975, 42 y.o.   MRN: [id number]  This writer spoke to [place] at [facility]. Verification received that on May, 02, 2019 Pt received his Invega injection.   Laveda Abbe, NP-C 06/16/2017    1504  Patient seen face-to-face for psychiatric evaluation, chart reviewed and case discussed with the physician extender and developed treatment plan. Reviewed the information documented and agree with the treatment plan.  Juanetta Beets, DO 06/16/17 5:52 PM

## 2017-06-17 MED ORDER — LORAZEPAM 1 MG PO TABS
1.0000 mg | ORAL_TABLET | Freq: Once | ORAL | Status: AC
  Administered 2017-06-17: 1 mg via ORAL
  Filled 2017-06-17: qty 1

## 2017-06-17 NOTE — [facility] Suicide Risk Assessment (Cosign Needed)
Suicide Risk Assessment  Discharge Assessment   [facility] Discharge Suicide Risk Assessment   Principal Problem: Schizoaffective disorder, depressive type [facility]) Discharge Diagnoses:  Patient Active Problem List   Diagnosis Date Noted  . Schizoaffective disorder, depressive type (HCC) [F25.1] 06/15/2017  . Schizophrenia (HCC) [F20.9] 10/23/2016  . MDD (major depressive disorder), recurrent, severe, with psychosis (HCC) [F33.3] 10/23/2016  . Elevated TSH [R79.89] 06/18/2016  . Paranoid schizophrenia (HCC) [F20.0] 06/17/2016  . Major depressive disorder with single episode [F32.9] 06/17/2016  . Alcohol use disorder, severe, dependence (HCC) [F10.20] 06/17/2016  . Cocaine use disorder, moderate, dependence (HCC) [F14.20] 06/17/2016  . Alcoholic intoxication without complication (HCC) [F10.920]   . Acute encephalopathy [G93.40] 06/08/2016  . Acute respiratory failure (HCC) [J96.00]   . Altered mental status [R41.82]   . Encephalopathy acute [G93.40] 06/07/2016  . Chest pain [R07.9] 04/04/2015    Total Time spent with patient: 30 minutes  Musculoskeletal: Strength & Muscle Tone: within normal limits Gait & Station: normal Patient leans: N/A  Psychiatric Specialty Exam: Physical Exam  Constitutional: He is oriented to person, place, and time. He appears well-developed and well-nourished.  HENT:  Head: Normocephalic.  Respiratory: Effort normal.  Musculoskeletal: Normal range of motion.  Neurological: He is alert and oriented to person, place, and time.  Psychiatric: His speech is normal and behavior is normal. Thought content normal. Cognition and memory are normal. He expresses impulsivity. He exhibits a depressed mood.   Review of Systems  Psychiatric/Behavioral: Positive for depression and substance abuse. Negative for hallucinations, memory loss and suicidal ideas. The patient is not nervous/anxious and does not have insomnia.   All other systems reviewed and are negative.  Blood  pressure (!) 140/95, pulse 73, temperature 98.2 F (36.8 C), temperature source Oral, resp. rate 16, SpO2 97 %.There is no height or weight on file to calculate BMI. General Appearance: Casual Eye Contact:  Good Speech:  Clear and Coherent and Normal Rate Volume:  Normal Mood:  Anxious and Depressed Affect:  Congruent and Depressed Thought Process:  Coherent, Goal Directed and Linear Orientation:  Full (Time, Place, and Person) Thought Content:  Logical Suicidal Thoughts:  No Homicidal Thoughts:  No Memory:  Immediate;   Good Recent;   Good Remote;   Fair Judgement:  Fair Insight:  Fair Psychomotor Activity:  Normal Concentration:  Concentration: Good and Attention Span: Good Recall:  Good Fund of Knowledge:  Good Language:  Good Akathisia:  No Handed:  Right AIMS (if indicated):    Assets:  Architect Housing ADL's:  Intact Cognition:  WNL   Mental Status Per Nursing Assessment::   On Admission:   Homicidal ideation  Demographic Factors:  Male, Caucasian and Low socioeconomic status  Loss Factors: Financial problems/change in socioeconomic status  Historical Factors: Impulsivity  Risk Reduction Factors:   Sense of responsibility to family and Living with another person, especially a relative  Continued Clinical Symptoms:  Depression:   Comorbid alcohol abuse/dependence Impulsivity Alcohol/Substance Abuse/Dependencies  Cognitive Features That Contribute To Risk:  Closed-mindedness    Suicide Risk:  Minimal: No identifiable suicidal ideation.  Patients presenting with no risk factors but with morbid ruminations; may be classified as minimal risk based on the severity of the depressive symptoms    Plan Of Care/Follow-up recommendations:  Activity:  as tolerated Diet:  Heart Healthy  Laveda Abbe, NP 06/17/2017, 11:50 AM

## 2017-06-17 NOTE — BH Assessment (Signed)
[facility] Assessment Progress Note  Per Juanetta Beets, DO, this pt does not require psychiatric hospitalization at this time.  Pt presents under IVC initiated by EDP Doug Sou, MD, which Dr Sharma Covert has rescinded.  Pt is to be discharged from [facility] with recommendation to continue treatment with [facility].  This has been included in pt's discharge instructions.  Pt would also benefit from seeing Peer Support Specialists; they will be asked to speak to pt.  Pt's nurse, Diane, has been notified.  Doylene Canning, MA Triage Specialist [phone]

## 2017-06-17 NOTE — Consult Note (Addendum)
[facility] Face-to-Face Psychiatry Consult   Reason for Consult:  Suicidal ideation Referring Physician:  EDP Patient Identification: Leonard Guerrero MRN:  [id number] Principal Diagnosis: Schizoaffective disorder, depressive type [facility]) Diagnosis:   Patient Active Problem List   Diagnosis Date Noted  . Schizoaffective disorder, depressive type (HCC) [F25.1] 06/15/2017  . Schizophrenia (HCC) [F20.9] 10/23/2016  . MDD (major depressive disorder), recurrent, severe, with psychosis (HCC) [F33.3] 10/23/2016  . Elevated TSH [R79.89] 06/18/2016  . Paranoid schizophrenia (HCC) [F20.0] 06/17/2016  . Major depressive disorder with single episode [F32.9] 06/17/2016  . Alcohol use disorder, severe, dependence (HCC) [F10.20] 06/17/2016  . Cocaine use disorder, moderate, dependence (HCC) [F14.20] 06/17/2016  . Alcoholic intoxication without complication (HCC) [F10.920]   . Acute encephalopathy [G93.40] 06/08/2016  . Acute respiratory failure (HCC) [J96.00]   . Altered mental status [R41.82]   . Encephalopathy acute [G93.40] 06/07/2016  . Chest pain [R07.9] 04/04/2015    Total Time spent with patient: 30 minutes  Subjective:   Leonard Guerrero is a 42 y.o. male patient admitted with SI.  HPI:  Pt was seen and chart reviewed with treatment team and Dr Sharma Covert.  Pt denies suicidal/homicidal ideation, denies auditory/visual hallucinations and does not appear to be responding to internal stimuli. Pt stated his roommates came to see him yesterday and everything is alright between them and he no longer wants to hurt them. Pt is calm and cooperative and medication compliant. Pt has a follow up appointment at [facility] on Wednesday. Pt is stable and psychiatrically clear for discharge.   Past Psychiatric History: Schizoaffective disorder  Risk to Self: None. Denies SI. Risk to Others: None. Denies HI.  Prior Inpatient Therapy: Prior Inpatient Therapy: Yes Prior Therapy Dates: 2018 x 2 Prior Therapy Facilty/Provider(s):  [facility]) Reason for Treatment: (depression and psychosis) Prior Outpatient Therapy: Prior Outpatient Therapy: Yes Prior Therapy Dates: (active) Prior Therapy Facilty/Provider(s): [facility]) Reason for Treatment: (suicidal) Does patient have an ACCT team?: No Does patient have Intensive In-House Services?  : No Does patient have [facility] services? : Yes Does patient have P4CC services?: No  Past Medical History:  Past Medical History:  Diagnosis Date  . Bipolar 1 disorder (HCC)   . ETOH abuse   . Hypertension   . Paranoid schizophrenia [facility])     Past Surgical History:  Procedure Laterality Date  . DENTAL SURGERY     complete extraction   Family History:  Family History  Problem Relation Age of Onset  . Mental illness Father   . Heart attack Father   . Hypertension Mother   . Mental illness Sister    Family Psychiatric  History: Unknown Social History:  Social History   Substance and Sexual Activity  Alcohol Use Yes  . Alcohol/week: 1.8 - 2.4 oz  . Types: 3 - 4 Cans of beer per week     Social History   Substance and Sexual Activity  Drug Use Not on file    Social History   Socioeconomic History  . Marital status: Single    Spouse name: Not on file  . Number of children: Not on file  . Years of education: Not on file  . Highest education level: Not on file  Occupational History  . Not on file  Social Needs  . Financial resource strain: Not on file  . Food insecurity:    Worry: Not on file    Inability: Not on file  . Transportation needs:    Medical: Not on file    Non-medical:  Not on file  Tobacco Use  . Smoking status: Current Every Day Smoker    Packs/day: 1.00    Years: 10.00    Pack years: 10.00    Types: Cigarettes  . Smokeless tobacco: Never Used  Substance and Sexual Activity  . Alcohol use: Yes    Alcohol/week: 1.8 - 2.4 oz    Types: 3 - 4 Cans of beer per week  . Drug use: Not on file  . Sexual activity: Not on file  Lifestyle  .  Physical activity:    Days per week: Not on file    Minutes per session: Not on file  . Stress: Not on file  Relationships  . Social connections:    Talks on phone: Not on file    Gets together: Not on file    Attends religious service: Not on file    Active member of club or organization: Not on file    Attends meetings of clubs or organizations: Not on file    Relationship status: Not on file  Other Topics Concern  . Not on file  Social History Narrative   ** Merged History Encounter **       Additional Social History: N/A    Allergies:  No Known Allergies  Labs:  Results for orders placed or performed during the hospital encounter of 06/15/17 (from the past 48 hour(s))  I-stat chem 8, ed     Status: Abnormal   Collection Time: 06/15/17 11:56 AM  Result Value Ref Range   Sodium 139 135 - 145 mmol/L   Potassium 4.6 3.5 - 5.1 mmol/L   Chloride 106 101 - 111 mmol/L   BUN 6 6 - 20 mg/dL   Creatinine, Ser 9.60 0.61 - 1.24 mg/dL   Glucose, Bld 72 65 - 99 mg/dL   Calcium, Ion 4.54 (L) 1.15 - 1.40 mmol/L   TCO2 21 (L) 22 - 32 mmol/L   Hemoglobin 14.3 13.0 - 17.0 g/dL   HCT 09.8 11.9 - 14.7 %    Current Facility-Administered Medications  Medication Dose Route Frequency Provider Last Rate Last Dose  . gabapentin (NEURONTIN) capsule 300 mg  300 mg Oral BID Akintayo, Mojeed, MD   300 mg at 06/17/17 8295  . hydrOXYzine (ATARAX/VISTARIL) tablet 25 mg  25 mg Oral Q6H PRN Doug Sou, MD   25 mg at 06/15/17 2118  . LORazepam (ATIVAN) injection 0-4 mg  0-4 mg Intravenous Q12H Doug Sou, MD       Or  . LORazepam (ATIVAN) tablet 0-4 mg  0-4 mg Oral Q12H Jacubowitz, Sam, MD      . nicotine (NICODERM CQ - dosed in mg/24 hours) patch 21 mg  21 mg Transdermal Q0600 Doug Sou, MD   21 mg at 06/17/17 0616  . paliperidone (INVEGA) 24 hr tablet 6 mg  6 mg Oral Daily Akintayo, Mojeed, MD   6 mg at 06/17/17 6213  . thiamine (VITAMIN B-1) tablet 100 mg  100 mg Oral Daily  Doug Sou, MD   100 mg at 06/17/17 0865   Or  . thiamine (B-1) injection 100 mg  100 mg Intravenous Daily Doug Sou, MD      . traZODone (DESYREL) tablet 200 mg  200 mg Oral QHS Akintayo, Mojeed, MD   200 mg at 06/16/17 2128  . trihexyphenidyl (ARTANE) tablet 5 mg  5 mg Oral TID WC Doug Sou, MD   5 mg at 06/17/17 7846   Current Outpatient Medications  Medication Sig Dispense  Refill  . escitalopram (LEXAPRO) 5 MG tablet Take 3 tablets (15 mg total) by mouth daily. 90 tablet 0  . gabapentin (NEURONTIN) 100 MG capsule Take 2 capsules (200 mg total) by mouth 2 (two) times daily. 120 capsule 0  . hydrOXYzine (ATARAX/VISTARIL) 25 MG tablet Take 1 tablet (25 mg total) by mouth every 6 (six) hours as needed for anxiety. 30 tablet 0  . Melatonin 5 MG TABS Take 5 mg by mouth at bedtime.    . nicotine (NICODERM CQ - DOSED IN MG/24 HOURS) 21 mg/24hr patch Place 1 patch (21 mg total) onto the skin daily at 6 (six) AM. 28 patch 0  . paliperidone (INVEGA SUSTENNA) 156 MG/ML SUSP injection Inject 0.75 mLs (117 mg total) into the muscle every 28 (twenty-eight) days. 0.9 mL 0  . paliperidone (INVEGA) 6 MG 24 hr tablet Take 1 tablet (6 mg total) by mouth daily. 30 tablet 0  . traZODone (DESYREL) 100 MG tablet Take 2 tablets (200 mg total) by mouth at bedtime as needed for sleep. 60 tablet 0  . trihexyphenidyl (ARTANE) 5 MG tablet Take 1 tablet (5 mg total) by mouth 3 (three) times daily with meals. 90 tablet 0    Musculoskeletal: Strength & Muscle Tone: within normal limits Gait & Station: normal Patient leans: N/A  Psychiatric Specialty Exam: Physical Exam  Nursing note and vitals reviewed. Constitutional: He is oriented to person, place, and time. He appears well-developed and well-nourished.  HENT:  Head: Normocephalic and atraumatic.  Neck: Normal range of motion.  Respiratory: Effort normal.  Musculoskeletal: Normal range of motion.  Neurological: He is alert and oriented to  person, place, and time.  Psychiatric: His speech is normal and behavior is normal. Thought content normal. Cognition and memory are normal. He expresses impulsivity. He exhibits a depressed mood.    Review of Systems  Psychiatric/Behavioral: Positive for depression and substance abuse. Negative for hallucinations, memory loss and suicidal ideas. The patient is not nervous/anxious and does not have insomnia.   All other systems reviewed and are negative.   Blood pressure (!) 140/95, pulse 73, temperature 98.2 F (36.8 C), temperature source Oral, resp. rate 16, SpO2 97 %.There is no height or weight on file to calculate BMI.  General Appearance: Casual  Eye Contact:  Good  Speech:  Clear and Coherent and Normal Rate  Volume:  Normal  Mood:  Anxious and Depressed  Affect:  Congruent and Depressed  Thought Process:  Coherent, Goal Directed and Linear  Orientation:  Full (Time, Place, and Person)  Thought Content:  Logical  Suicidal Thoughts:  No  Homicidal Thoughts:  No  Memory:  Immediate;   Good Recent;   Good Remote;   Fair  Judgement:  Fair  Insight:  Fair  Psychomotor Activity:  Normal  Concentration:  Concentration: Good and Attention Span: Good  Recall:  Good  Fund of Knowledge:  Good  Language:  Good  Akathisia:  No  Handed:  Right  AIMS (if indicated):   N/A  Assets:  Architect Housing  ADL's:  Intact  Cognition:  WNL  Sleep:   N/A     Treatment Plan Summary: Plan Schizoaffective disorder, depressive type (HCC)  Discharge Home Take all medications as prescribed Avoid the use of alcohol and illicit drugs Follow up at [facility]  Disposition: No evidence of imminent risk to self or others at present.   Patient does not meet criteria for psychiatric inpatient admission. Supportive therapy provided about  ongoing stressors. Discussed crisis plan, support from social network, calling 911, coming to the Emergency Department,  and calling Suicide Hotline.  Laveda Abbe, NP 06/17/2017 11:47 AM   Patient seen face-to-face for psychiatric evaluation, chart reviewed and case discussed with the physician extender and developed treatment plan. Reviewed the information documented and agree with the treatment plan.  Juanetta Beets, DO 06/17/17 11:05 PM

## 2017-06-17 NOTE — Patient Outreach (Signed)
[facility] met with the patient and provided substance use recovery support. Patient states that he does not need substance use recovery treatment or help with substance use recovery resources at this time. [facility] still provided [facility] contact information. [facility] encouraged the patient to contact [facility] if he would like help with recovery resources or for further substance use recovery support.

## 2017-06-17 NOTE — Discharge Instructions (Signed)
For your mental health needs, you are advised to continue treatment with [facility]: ° °     [facility] °     [address] °     , [place] [address] °     [phone] °

## 2017-06-17 NOTE — ED Notes (Signed)
Pt discharged home. Discharged instructions read to pt who verbalized understanding. All belongings returned to pt who signed for same. Denies SI/HI, is not delusional and not responding to internal stimuli. Escorted pt to the ED exit.    

## 2017-12-08 ENCOUNTER — Emergency Department (HOSPITAL_COMMUNITY)
Admission: EM | Admit: 2017-12-08 | Discharge: 2017-12-09 | Disposition: A | Discharge status: Other Acute Inpatient Hospital | Payer: Medicaid Other | Attending: Emergency Medicine | Admitting: Emergency Medicine

## 2017-12-08 ENCOUNTER — Other Ambulatory Visit: Payer: Self-pay

## 2017-12-08 DIAGNOSIS — F205 Residual schizophrenia: Secondary | ICD-10-CM

## 2017-12-08 DIAGNOSIS — T1491XA Suicide attempt, initial encounter: Secondary | ICD-10-CM | POA: Diagnosis not present

## 2017-12-08 DIAGNOSIS — I10 Essential (primary) hypertension: Secondary | ICD-10-CM | POA: Insufficient documentation

## 2017-12-08 DIAGNOSIS — F17210 Nicotine dependence, cigarettes, uncomplicated: Secondary | ICD-10-CM | POA: Insufficient documentation

## 2017-12-08 DIAGNOSIS — Z79899 Other long term (current) drug therapy: Secondary | ICD-10-CM | POA: Diagnosis not present

## 2017-12-08 DIAGNOSIS — F29 Unspecified psychosis not due to a substance or known physiological condition: Secondary | ICD-10-CM | POA: Diagnosis present

## 2017-12-08 DIAGNOSIS — T43211A Poisoning by selective serotonin and norepinephrine reuptake inhibitors, accidental (unintentional), initial encounter: Secondary | ICD-10-CM | POA: Diagnosis not present

## 2017-12-08 DIAGNOSIS — F251 Schizoaffective disorder, depressive type: Secondary | ICD-10-CM | POA: Diagnosis not present

## 2017-12-08 LAB — COMPREHENSIVE METABOLIC PANEL
ALBUMIN: 4.4 g/dL (ref 3.5–5.0)
ALK PHOS: 48 U/L (ref 38–126)
ALT: 93 U/L — ABNORMAL HIGH (ref 0–44)
ANION GAP: 12 (ref 5–15)
AST: 94 U/L — AB (ref 15–41)
BUN: 11 mg/dL (ref 6–20)
CO2: 26 mmol/L (ref 22–32)
Calcium: 9.6 mg/dL (ref 8.9–10.3)
Chloride: 99 mmol/L (ref 98–111)
Creatinine, Ser: 0.97 mg/dL (ref 0.61–1.24)
GFR calc Af Amer: >60 mL/min (ref >60)
GFR calc non Af Amer: >60 mL/min (ref >60)
GLUCOSE: 113 mg/dL — AB (ref 70–99)
Potassium: 4.1 mmol/L (ref 3.5–5.1)
SODIUM: 137 mmol/L (ref 135–145)
Total Bilirubin: 1.1 mg/dL (ref 0.3–1.2)
Total Protein: 8 g/dL (ref 6.5–8.1)

## 2017-12-08 LAB — ACETAMINOPHEN LEVEL

## 2017-12-08 LAB — RAPID URINE DRUG SCREEN, HOSP PERFORMED
AMPHETAMINES: NOT DETECTED
BENZODIAZEPINES: NOT DETECTED
Barbiturates: NOT DETECTED
Cocaine: NOT DETECTED
Opiates: NOT DETECTED
Tetrahydrocannabinol: NOT DETECTED

## 2017-12-08 LAB — CBC
HCT: 45.7 % (ref 39.0–52.0)
Hemoglobin: 15.9 g/dL (ref 13.0–17.0)
MCH: 33.3 pg (ref 26.0–34.0)
MCHC: 34.8 g/dL (ref 30.0–36.0)
MCV: 95.6 fL (ref 80.0–100.0)
NRBC: 0 % (ref 0.0–0.2)
PLATELETS: 102 10*3/uL — AB (ref 150–400)
RBC: 4.78 MIL/uL (ref 4.22–5.81)
RDW: 13.2 % (ref 11.5–15.5)
WBC: 6.7 10*3/uL (ref 4.0–10.5)

## 2017-12-08 LAB — SALICYLATE LEVEL

## 2017-12-08 LAB — CBG MONITORING, ED: Glucose-Capillary: 125 mg/dL — ABNORMAL HIGH (ref 70–99)

## 2017-12-08 LAB — ETHANOL: Alcohol, Ethyl (B): <10 mg/dL (ref <10)

## 2017-12-08 MED ORDER — LORAZEPAM 2 MG/ML IJ SOLN: 0.0000 mg | Freq: Four times a day (QID) | INTRAMUSCULAR | Status: DC

## 2017-12-08 MED ORDER — ONDANSETRON HCL 4 MG PO TABS: 4.0000 mg | ORAL_TABLET | Freq: Three times a day (TID) | ORAL | Status: DC | PRN

## 2017-12-08 MED ORDER — VITAMIN B-1 100 MG PO TABS
100.0000 mg | ORAL_TABLET | Freq: Every day | ORAL | Status: DC
  Administered 2017-12-09: 100 mg via ORAL
  Filled 2017-12-08: qty 1

## 2017-12-08 MED ORDER — LORAZEPAM 2 MG/ML IJ SOLN
1.0000 mg | Freq: Once | INTRAMUSCULAR | Status: AC
  Administered 2017-12-08: 1 mg via INTRAVENOUS
  Filled 2017-12-08: qty 1

## 2017-12-08 MED ORDER — LORAZEPAM 1 MG PO TABS: 0.0000 mg | ORAL_TABLET | Freq: Two times a day (BID) | ORAL | Status: DC

## 2017-12-08 MED ORDER — THIAMINE HCL 100 MG/ML IJ SOLN: 100.0000 mg | Freq: Every day | INTRAMUSCULAR | Status: DC

## 2017-12-08 MED ORDER — BENZONATATE 100 MG PO CAPS
200.0000 mg | ORAL_CAPSULE | Freq: Once | ORAL | Status: AC
  Administered 2017-12-08: 200 mg via ORAL
  Filled 2017-12-08: qty 2

## 2017-12-08 MED ORDER — LORAZEPAM 1 MG PO TABS
0.0000 mg | ORAL_TABLET | Freq: Four times a day (QID) | ORAL | Status: DC
  Administered 2017-12-08: 1 mg via ORAL
  Filled 2017-12-08: qty 1

## 2017-12-08 MED ORDER — LORAZEPAM 2 MG/ML IJ SOLN: 0.0000 mg | Freq: Two times a day (BID) | INTRAMUSCULAR | Status: DC

## 2017-12-08 NOTE — BH Assessment (Signed)
[facility] Assessment Progress Note   Case was staffed with Lewis NP who recommended a inpatient admission to assist with stabilization.  

## 2017-12-08 NOTE — ED Notes (Signed)
PC called and reviewed pt labs and vitals. They say that patient is medically cleared by their standpoint

## 2017-12-08 NOTE — ED Triage Notes (Signed)
Pt reports taking 22 ingrezza and 17 trazodone at 2300 12/07/2017. VSS. Pt mother reports finding patient unresponsive, on bed, with eyes rolled back. This is pt 4th OD/SI attempt. Pt is A&O at this time, currently vomiting.

## 2017-12-08 NOTE — BH Assessment (Signed)
Assessment Note  Leonard Guerrero is an 42 y.o. male that presents this date with S/I. Patient had a plan to overdose per notes, reporting that he ingested 22 ingrezza and 17 trazodone at 2300 on 12/07/2017. Patient reports he was attempting to take his life because he "was tired." Patient will not elaborate on content and renders limit history. Patient is observed to be tremulous and speaks in a low soft voice that is inaudible at times. Patient is currently impaired and is very drowsy providing limit information. Patient finds it difficult to stay awake as this writer attempts to assess. Patient denies any H/I or AVH shaking his head "no" to most questions. Patient states that he drinks alcohol daily reporting he consumes varies amounts. Per note review patient was last seen at [facility] on 06/15/17 when he presented at that time with S/I. Patient reports multiple attempts at self harm. Patient states he has been receiving services from [facility] that assists with medication management where he "gets a shot." It is unclear when patient last saw that provider. Patient is unable to complete assessment due to falling asleep. Information to complete assessment was obtained from notes and prior history. Per notes, "Patient's mother reports finding patient unresponsive, on bed, with eyes rolled back. This is patient's 4th OD/SI attempt. Patient has history of multiple suicide attempts before in the past. Patient has a history of paranoid schizophrenia as well as bipolar disorder. He is reported to have taken 22 Ingrezza and 17 trazodone tablets approximately 13 hours ago. Case was staffed with Melvyn Neth NP who recommended a inpatient admission to assist with stabilization.   Diagnosis: F25.1 Schizoaffective disorder, depressive type, Alcohol abuse  Past Medical History:  Past Medical History:  Diagnosis Date  . Bipolar 1 disorder (HCC)   . ETOH abuse   . Hypertension   . Paranoid schizophrenia [facility])     Past Surgical  History:  Procedure Laterality Date  . DENTAL SURGERY     complete extraction    Family History:  Family History  Problem Relation Age of Onset  . Mental illness Father   . Heart attack Father   . Hypertension Mother   . Mental illness Sister     Social History:  reports that he has been smoking cigarettes. He has a 10.00 pack-year smoking history. He has never used smokeless tobacco. He reports that he drinks about 3.0 - 4.0 standard drinks of alcohol per week. His drug history is not on file.  Additional Social History:  Alcohol / Drug Use Pain Medications: See MAR Prescriptions: See MAR Over the Counter: See MAR History of alcohol / drug use?: Yes Longest period of sobriety (when/how long): unknown Negative Consequences of Use: Personal relationships Withdrawal Symptoms: Agitation, Weakness, Tremors Substance #1 Name of Substance 1: Alcohol 1 - Age of First Use: Unknown 1 - Amount (size/oz): Varies 1 - Frequency: Daily 1 - Duration: Unknown 1 - Last Use / Amount: 12/08/17 7 12  oz beers  CIWA: CIWA-Ar BP: 119/75 Pulse Rate: 66 COWS:    Allergies: No Known Allergies  Home Medications:  (Not in a hospital admission)  OB/GYN Status:  No LMP for male patient.  General Assessment Data Location of Assessment: [facility] ED TTS Assessment: In system Is this a Tele or Face-to-Face Assessment?: Face-to-Face Is this an Initial Assessment or a Re-assessment for this encounter?: Initial Assessment Patient Accompanied by:: N/A Language Other than English: No Living Arrangements: (Alone) What gender do you identify as?: Male Marital status: Single [place]  name: NA Pregnancy Status: No Living Arrangements: Alone Can pt return to current living arrangement?: Yes Admission Status: Voluntary Is patient capable of signing voluntary admission?: Yes Referral Source: Self/Family/Friend Insurance type: Medicaid  Medical Screening Exam [facility] Walk-in ONLY) Medical Exam completed:  Yes  Crisis Care Plan Living Arrangements: Alone Legal Guardian: (NA) Name of Psychiatrist: [facility] Name of Therapist: None  Education Status Is patient currently in school?: No Is the patient employed, unemployed or receiving disability?: Unemployed  Risk to self with the past 6 months Suicidal Ideation: Yes-Currently Present Has patient been a risk to self within the past 6 months prior to admission? : Yes Suicidal Intent: Yes-Currently Present Has patient had any suicidal intent within the past 6 months prior to admission? : Yes Is patient at risk for suicide?: Yes Suicidal Plan?: Yes-Currently Present Has patient had any suicidal plan within the past 6 months prior to admission? : Yes Specify Current Suicidal Plan: Overdose Access to Means: Yes Specify Access to Suicidal Means: Pt has medications What has been your use of drugs/alcohol within the last 12 months?: Current use Previous Attempts/Gestures: Yes How many times?: (Multiple) Other Self Harm Risks: (Excessive SA use) Triggers for Past Attempts: Unknown Intentional Self Injurious Behavior: None Family Suicide History: No Recent stressful life event(s): Other (Comment)(Excessive SA use) Persecutory voices/beliefs?: No Depression: Yes Depression Symptoms: Feeling worthless/self pity Substance abuse history and/or treatment for substance abuse?: Yes Suicide prevention information given to non-admitted patients: Not applicable  Risk to Others within the past 6 months Homicidal Ideation: No Does patient have any lifetime risk of violence toward others beyond the six months prior to admission? : No Thoughts of Harm to Others: No Current Homicidal Intent: No Current Homicidal Plan: No Access to Homicidal Means: No Identified Victim: NA History of harm to others?: No Assessment of Violence: None Noted Violent Behavior Description: NA Does patient have access to weapons?: No Criminal Charges Pending?: No Does  patient have a court date: No Is patient on probation?: No  Psychosis Hallucinations: None noted Delusions: None noted  Mental Status Report Appearance/Hygiene: In hospital gown Eye Contact: Unable to Assess Motor Activity: Tremors, Unsteady Speech: Slow, Slurred Level of Consciousness: Drowsy Mood: Depressed Affect: Sad Anxiety Level: Minimal Thought Processes: Unable to Assess Judgement: Unable to Assess Orientation: Unable to assess Obsessive Compulsive Thoughts/Behaviors: None  Cognitive Functioning Concentration: Unable to Assess Memory: Unable to Assess Is patient IDD: No Insight: Unable to Assess Impulse Control: Unable to Assess Appetite: (UTA) Have you had any weight changes? : (UTA) Sleep: (UTA) Total Hours of Sleep: (UTA) Vegetative Symptoms: (UTA)  ADLScreening [facility] Assessment Services) Patient's cognitive ability adequate to safely complete daily activities?: Yes Patient able to express need for assistance with ADLs?: Yes Independently performs ADLs?: Yes (appropriate for developmental age)  Prior Inpatient Therapy Prior Inpatient Therapy: Yes Prior Therapy Dates: 2019 Prior Therapy Facilty/Provider(s): [facility], [facility] Reason for Treatment: MH issues  Prior Outpatient Therapy Prior Outpatient Therapy: Yes Prior Therapy Dates: Ongoing Prior Therapy Facilty/Provider(s): [facility] Reason for Treatment: Med mang Does patient have an ACCT team?: No Does patient have Intensive In-House Services?  : No Does patient have [facility] services? : Yes Does patient have P4CC services?: No  ADL Screening (condition at time of admission) Patient's cognitive ability adequate to safely complete daily activities?: Yes Is the patient deaf or have difficulty hearing?: No Does the patient have difficulty seeing, even when wearing glasses/contacts?: No Does the patient have difficulty concentrating, remembering, or making decisions?: No Patient  able to express need for  assistance with ADLs?: Yes Does the patient have difficulty dressing or bathing?: No Independently performs ADLs?: Yes (appropriate for developmental age) Does the patient have difficulty walking or climbing stairs?: No Weakness of Legs: None Weakness of Arms/Hands: None  Home Assistive Devices/Equipment Home Assistive Devices/Equipment: None  Therapy Consults (therapy consults require a physician order) PT Evaluation Needed: No OT Evalulation Needed: No SLP Evaluation Needed: No Abuse/Neglect Assessment (Assessment to be complete while patient is alone) Physical Abuse: Denies Verbal Abuse: Denies Sexual Abuse: Denies Exploitation of patient/patient's resources: Denies Self-Neglect: Denies Values / Beliefs Cultural Requests During Hospitalization: None Spiritual Requests During Hospitalization: None Consults Spiritual Care Consult Needed: No Social Work Consult Needed: No Merchant navy officer (For Healthcare) Does Patient Have a Medical Advance Directive?: No Would patient like information on creating a medical advance directive?: No - Patient declined          Disposition: Case was staffed with Melvyn Neth NP who recommended a inpatient admission to assist with stabilization.  Disposition Initial Assessment Completed for this Encounter: Yes Disposition of Patient: Admit Type of inpatient treatment program: Adult Patient refused recommended treatment: No Mode of transportation if patient is discharged?: Loreli Slot)  On Site Evaluation by:   Reviewed with Physician:    Alfredia Ferguson 12/08/2017 5:34 PM

## 2017-12-08 NOTE — ED Notes (Signed)
Mother at bedside. Mother wand and belongings put in cabinet.

## 2017-12-08 NOTE — ED Provider Notes (Signed)
[facility]-EMERGENCY DEPT Provider Note   CSN: [id number] Arrival date & time: 12/08/17  1205     History   Chief Complaint Chief Complaint  Patient presents with  . Suicidal  . Drug Overdose    HPI Leonard Guerrero is a 42 y.o. male.  42 year old male presents after intentional ingestion yesterday with plan for suicide.  Patient has history of multiple suicide attempt before in the past.  Has a history of paranoid schizophrenia as well as bipolar disorder.  He is reported to have taken 22 Ingrezza and 17 trazodone tablets approximately 13 hours ago.  He would not disclose why he did this.  Denies any auditory hallucinations.  Patient also drinks 15 beers a day and his last drink was over 16 hours ago.  He has had some dry heaves which is been nonbilious or bloody.  Has also had some sweating.     Past Medical History:  Diagnosis Date  . Bipolar 1 disorder (HCC)   . ETOH abuse   . Hypertension   . Paranoid schizophrenia [facility])     Patient Active Problem List   Diagnosis Date Noted  . Schizoaffective disorder, depressive type (HCC) 06/15/2017  . Schizophrenia (HCC) 10/23/2016  . 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 Elevated TSH 06/18/2016  . Paranoid schizophrenia (HCC) 06/17/2016  . Major depressive disorder with single episode 06/17/2016  . Alcohol use disorder, severe, dependence (HCC) 06/17/2016  . Cocaine use disorder, moderate, dependence (HCC) 06/17/2016  . Alcoholic intoxication without complication (HCC)   . Acute encephalopathy 06/08/2016  . Acute respiratory failure (HCC)   . Altered mental status   . Encephalopathy acute 06/07/2016  . Chest pain 04/04/2015    Past Surgical History:  Procedure Laterality Date  . DENTAL SURGERY     complete extraction        Home Medications    Prior to Admission medications   Medication Sig Start Date End Date Taking? Authorizing Provider    escitalopram (LEXAPRO) 5 MG tablet Take 3 tablets (15 mg total) by mouth daily. 10/30/16   Withrow, Everardo All, FNP  gabapentin (NEURONTIN) 100 MG capsule Take 2 capsules (200 mg total) by mouth 2 (two) times daily. 10/29/16   Withrow, Everardo All, FNP  hydrOXYzine (ATARAX/VISTARIL) 25 MG tablet Take 1 tablet (25 mg total) by mouth every 6 (six) hours as needed for anxiety. 10/29/16   Withrow, Everardo All, FNP  Melatonin 5 MG TABS Take 5 mg by mouth at bedtime.    [provider]  nicotine (NICODERM CQ - DOSED IN MG/24 HOURS) 21 mg/24hr patch Place 1 patch (21 mg total) onto the skin daily at 6 (six) AM. 10/29/16   Withrow, Everardo All, FNP  paliperidone (INVEGA SUSTENNA) 156 MG/ML SUSP injection Inject 0.75 mLs (117 mg total) into the muscle every 28 (twenty-eight) days. 11/20/16   Withrow, Everardo All, FNP  paliperidone (INVEGA) 6 MG 24 hr tablet Take 1 tablet (6 mg total) by mouth daily. 10/30/16   Withrow, Everardo All, FNP  traZODone (DESYREL) 100 MG tablet Take 2 tablets (200 mg total) by mouth at bedtime as needed for sleep. 10/29/16   Withrow, Everardo All, FNP  trihexyphenidyl (ARTANE) 5 MG tablet Take 1 tablet (5 mg total) by mouth 3 (three) times daily with meals. 10/29/16   Withrow, Everardo All, FNP    Family History Family History  Problem Relation Age of Onset  . Mental illness Father   .  Heart attack Father   . Hypertension Mother   . Mental illness Sister     Social History Social History   Tobacco Use  . Smoking status: Current Every Day Smoker    Packs/day: 1.00    Years: 10.00    Pack years: 10.00    Types: Cigarettes  . Smokeless tobacco: Never Used  Substance Use Topics  . Alcohol use: Yes    Alcohol/week: 3.0 - 4.0 standard drinks    Types: 3 - 4 Cans of beer per week  . Drug use: Not on file     Allergies   Patient has no known allergies.   Review of Systems Review of Systems  All other systems reviewed and are negative.    Physical Exam Updated Vital Signs BP 135/83 (BP Location:  Left Arm)   Pulse 75   Temp 97.6 F (36.4 C) (Oral)   Resp 18   Ht 1.829 m (6')   Wt 87.5 kg   SpO2 94%   BMI 26.18 kg/m   Physical Exam  Constitutional: He is oriented to person, place, and time. He appears well-developed and well-nourished. He appears lethargic.  Non-toxic appearance. No distress.  HENT:  Head: Normocephalic and atraumatic.  Eyes: Pupils are equal, round, and reactive to light. Conjunctivae, EOM and lids are normal.  Neck: Normal range of motion. Neck supple. No tracheal deviation present. No thyroid mass present.  Cardiovascular: Normal rate, regular rhythm and normal heart sounds. Exam reveals no gallop.  No murmur heard. Pulmonary/Chest: Effort normal and breath sounds normal. No stridor. No respiratory distress. He has no decreased breath sounds. He has no wheezes. He has no rhonchi. He has no rales.  Abdominal: Soft. Normal appearance and bowel sounds are normal. He exhibits no distension. There is no tenderness. There is no rebound and no CVA tenderness.  Musculoskeletal: Normal range of motion. He exhibits no edema or tenderness.  Neurological: He is oriented to person, place, and time. He appears lethargic. He displays tremor. No cranial nerve deficit or sensory deficit. GCS eye subscore is 4. GCS verbal subscore is 5. GCS motor subscore is 6.  Muscle spasm noted in his upper extremities consistent with tardive dyskinesia  Skin: Skin is warm and dry. No abrasion and no rash noted.  Psychiatric: His mood appears anxious. His affect is labile. His speech is delayed. He is withdrawn. He expresses impulsivity and inappropriate judgment. He expresses suicidal ideation. He expresses no homicidal ideation. He expresses suicidal plans. He expresses no homicidal plans.  Nursing note and vitals reviewed.    ED Treatments / Results  Labs (all labs ordered are listed, but only abnormal results are displayed) Labs Reviewed  CBG MONITORING, ED - Abnormal; Notable for  the following components:      Result Value   Glucose-Capillary 125 (*)    All other components within normal limits  RAPID URINE DRUG SCREEN, HOSP PERFORMED  COMPREHENSIVE METABOLIC PANEL  ETHANOL  SALICYLATE LEVEL  ACETAMINOPHEN LEVEL  CBC    EKG EKG Interpretation  Date/Time:  Monday December 08 2017 12:59:02 EST Ventricular Rate:  72 PR Interval:    QRS Duration: 120 QT Interval:  429 QTC Calculation: 470 R Axis:   80 Text Interpretation:  Sinus rhythm IVCD, consider atypical RBBB Confirmed by Lorre Nick ([id number]) on 12/08/2017 1:39:29 PM   Radiology No results found.  Procedures Procedures (including critical care time)  Medications Ordered in ED Medications - No data to display   Initial Impression /  Assessment and Plan / ED Course  I have reviewed the triage vital signs and the nursing notes.  Pertinent labs & imaging results that were available during my care of the patient were reviewed by me and considered in my medical decision making (see chart for details).     Patient given Ativan here for possible early withdrawals . poison control contacted and patient will be monitored here.  Will be seen by behavioral health once clinically sober.  Care will be signed out to next provider  CRITICAL CARE Performed by: Toy Baker Total critical care time: 55 minutes Critical care time was exclusive of separately billable procedures and treating other patients. Critical care was necessary to treat or prevent imminent or life-threatening deterioration. Critical care was time spent personally by me on the following activities: development of treatment plan with patient and/or surrogate as well as nursing, discussions with consultants, evaluation of patient's response to treatment, examination of patient, obtaining history from patient or surrogate, ordering and performing treatments and interventions, ordering and review of laboratory studies, ordering and review of  radiographic studies, pulse oximetry and re-evaluation of patient's condition.   Final Clinical Impressions(s) / ED Diagnoses   Final diagnoses:  None    ED Discharge Orders    None       Lorre Nick, MD 12/08/17 1447

## 2017-12-08 NOTE — ED Notes (Signed)
Bed: WA18 Expected date:  Expected time:  Means of arrival:  Comments: EMS/SI 

## 2017-12-08 NOTE — ED Notes (Addendum)
  Name/Age/Gender Leonard Guerrero 42 y.o. male  Chief Complaint si  Medical History Past Medical History:  Diagnosis Date  . Bipolar 1 disorder (HCC)   . ETOH abuse   . Hypertension   . Paranoid schizophrenia (HCC)     Allergies No Known Allergies  Labs/Imaging Results for orders placed or performed during the hospital encounter of 12/08/17 (from the past 48 hour(s))  CBG monitoring, ED     Status: Abnormal   Collection Time: 12/08/17  1:02 PM  Result Value Ref Range   Glucose-Capillary 125 (H) 70 - 99 mg/dL   No results found.  Pending Labs Unresulted Labs (From admission, onward)    Start     Ordered   12/08/17 1226  Comprehensive metabolic panel  STAT,   STAT     12/08/17 1225   12/08/17 1226  Ethanol  Once,   STAT     12/08/17 1225   12/08/17 1226  Salicylate level  Once,   STAT     12/08/17 1225   12/08/17 1226  Acetaminophen level  Once,   STAT     12/08/17 1225   12/08/17 1226  cbc  Once,   STAT     12/08/17 1225   12/08/17 1226  Rapid urine drug screen (hospital performed)  Once,   STAT     12/08/17 1225          EKG Results None  Vitals/Pain Today's Vitals   12/08/17 1214 12/08/17 1215 12/08/17 1219  BP: 135/83    Pulse: 75    Resp: 18    Temp: 97.6 F (36.4 C)    TempSrc: Oral    SpO2: 94%    Weight:   87.5 kg  Height:   6' (1.829 m)  PainSc:  0-No pain      Medications Medications - No data to display  Fax Number [phone]  Recommendations from poison control:  Spoke to [place] from Poison Control:  Cardiac monitor and EKG-QT elongation Usual toxicology labs Watch for minimum for 6 hours or until asymptomatic   Acute toxicity:  Acute dystonia, n/v, sweating, sedation, hypotension, confusion, diarrhea, hallucinations, tremors  Severe: Hypotension syncope, confusion, hallucination

## 2017-12-08 NOTE — ED Notes (Signed)
Recommendations from poison control:  Spoke to [employer] from [employer]:  Cardiac monitor and EKG-QT elongation Usual toxicology labs Watch for minimum for 6 hours or until asymptomatic   Acute toxicity:  Acute dystonia, n/v, sweating, sedation, hypotension, confusion, diarrhea, hallucinations, tremors  Severe: Hypotension syncope, confusion, hallucination

## 2017-12-08 NOTE — ED Notes (Signed)
Pt to room #34. Pt forwards little with this nurse, guarded. When asked what pt stressors are, pt responds "everything" Reports suicide attempt by OD last night. Denies SI/HI/AVH currently. Encouragement and support provided. Special checks q 15 mins in place for safety, Video monitoring in place. Will continue to monitor.

## 2017-12-08 NOTE — ED Notes (Signed)
Pastor at bedside. Wand and belonging put up.

## 2017-12-08 NOTE — ED Notes (Signed)
Report given to [facility]U. Patient can go to room 34.

## 2017-12-09 ENCOUNTER — Other Ambulatory Visit: Payer: Self-pay

## 2017-12-09 ENCOUNTER — Emergency Department (HOSPITAL_COMMUNITY): Payer: Medicaid Other

## 2017-12-09 ENCOUNTER — Inpatient Hospital Stay (HOSPITAL_COMMUNITY)
Admission: AD | Admit: 2017-12-09 | Discharge: 2017-12-12 | DRG: 885 | Disposition: A | Discharge status: Home / Self Care | Payer: Medicaid Other | Source: Intra-hospital | Attending: Psychiatry | Admitting: Psychiatry

## 2017-12-09 ENCOUNTER — Encounter (HOSPITAL_COMMUNITY): Payer: Self-pay

## 2017-12-09 DIAGNOSIS — F17210 Nicotine dependence, cigarettes, uncomplicated: Secondary | ICD-10-CM | POA: Diagnosis present

## 2017-12-09 DIAGNOSIS — T1491XA Suicide attempt, initial encounter: Secondary | ICD-10-CM | POA: Diagnosis not present

## 2017-12-09 DIAGNOSIS — F209 Schizophrenia, unspecified: Secondary | ICD-10-CM | POA: Diagnosis present

## 2017-12-09 DIAGNOSIS — I10 Essential (primary) hypertension: Secondary | ICD-10-CM | POA: Diagnosis present

## 2017-12-09 DIAGNOSIS — Z818 Family history of other mental and behavioral disorders: Secondary | ICD-10-CM | POA: Diagnosis not present

## 2017-12-09 DIAGNOSIS — T43211A Poisoning by selective serotonin and norepinephrine reuptake inhibitors, accidental (unintentional), initial encounter: Secondary | ICD-10-CM

## 2017-12-09 DIAGNOSIS — F329 Major depressive disorder, single episode, unspecified: Secondary | ICD-10-CM | POA: Diagnosis present

## 2017-12-09 DIAGNOSIS — F319 Bipolar disorder, unspecified: Secondary | ICD-10-CM | POA: Diagnosis present

## 2017-12-09 DIAGNOSIS — Z8249 Family history of ischemic heart disease and other diseases of the circulatory system: Secondary | ICD-10-CM

## 2017-12-09 DIAGNOSIS — Z915 Personal history of self-harm: Secondary | ICD-10-CM

## 2017-12-09 DIAGNOSIS — F205 Residual schizophrenia: Secondary | ICD-10-CM

## 2017-12-09 DIAGNOSIS — F200 Paranoid schizophrenia: Secondary | ICD-10-CM | POA: Diagnosis present

## 2017-12-09 DIAGNOSIS — F10239 Alcohol dependence with withdrawal, unspecified: Secondary | ICD-10-CM | POA: Diagnosis present

## 2017-12-09 DIAGNOSIS — F10230 Alcohol dependence with withdrawal, uncomplicated: Secondary | ICD-10-CM

## 2017-12-09 DIAGNOSIS — F251 Schizoaffective disorder, depressive type: Principal | ICD-10-CM | POA: Diagnosis present

## 2017-12-09 MED ORDER — DIPHENHYDRAMINE HCL 50 MG/ML IJ SOLN
25.0000 mg | Freq: Once | INTRAMUSCULAR | Status: AC
  Administered 2017-12-09: 25 mg via INTRAMUSCULAR
  Filled 2017-12-09: qty 1

## 2017-12-09 MED ORDER — ESCITALOPRAM OXALATE 5 MG PO TABS
15.0000 mg | ORAL_TABLET | Freq: Every day | ORAL | Status: DC
  Administered 2017-12-09 – 2017-12-12 (×4): 15 mg via ORAL
  Filled 2017-12-09 (×8): qty 1

## 2017-12-09 MED ORDER — NICOTINE 21 MG/24HR TD PT24
21.0000 mg | MEDICATED_PATCH | Freq: Every day | TRANSDERMAL | Status: DC
  Administered 2017-12-10 – 2017-12-12 (×3): 21 mg via TRANSDERMAL
  Filled 2017-12-09 (×7): qty 1

## 2017-12-09 MED ORDER — MAGNESIUM HYDROXIDE 400 MG/5ML PO SUSP: 30.0000 mL | Freq: Every day | ORAL | Status: DC | PRN

## 2017-12-09 MED ORDER — ALUM & MAG HYDROXIDE-SIMETH 200-200-20 MG/5ML PO SUSP: 30.0000 mL | ORAL | Status: DC | PRN

## 2017-12-09 MED ORDER — GABAPENTIN 100 MG PO CAPS
200.0000 mg | ORAL_CAPSULE | Freq: Two times a day (BID) | ORAL | Status: DC
  Administered 2017-12-09 – 2017-12-10 (×2): 200 mg via ORAL
  Filled 2017-12-09 (×6): qty 2

## 2017-12-09 MED ORDER — HYDROXYZINE HCL 25 MG PO TABS
25.0000 mg | ORAL_TABLET | Freq: Four times a day (QID) | ORAL | Status: DC | PRN
  Administered 2017-12-09: 25 mg via ORAL
  Filled 2017-12-09: qty 1

## 2017-12-09 MED ORDER — DIPHENHYDRAMINE HCL 25 MG PO CAPS
25.0000 mg | ORAL_CAPSULE | Freq: Four times a day (QID) | ORAL | Status: DC | PRN
  Filled 2017-12-09: qty 1

## 2017-12-09 MED ORDER — TRAZODONE HCL 100 MG PO TABS
100.0000 mg | ORAL_TABLET | Freq: Every evening | ORAL | Status: DC | PRN
  Administered 2017-12-09: 100 mg via ORAL
  Filled 2017-12-09: qty 1

## 2017-12-09 MED ORDER — ACETAMINOPHEN 325 MG PO TABS: 650.0000 mg | ORAL_TABLET | Freq: Four times a day (QID) | ORAL | Status: DC | PRN

## 2017-12-09 NOTE — ED Notes (Signed)
Pt discharged safely with [place] driver.  Pt was calm and cooperative.  All belongings were sent with patient. 

## 2017-12-09 NOTE — ED Notes (Signed)
Pt complains of shortness of breath and feeling like something is stuck in his throat.  spo2 100%.  Vital signs wdl.  EDP notified.

## 2017-12-09 NOTE — Progress Notes (Signed)
D: Pt denies SI/HI/AVH. Pt is pleasant and cooperative. Pt isolated to his room most of the evening. Pt was in the dayroom for a little while.  A: Pt was offered support and encouragement. Pt was given scheduled medications. Pt was encourage to attend groups. Q 15 minute checks were done for safety.  R: safety maintained on unit.  Problem: Education: Goal: Emotional status will improve Outcome: Progressing

## 2017-12-09 NOTE — Tx Team (Signed)
Initial Treatment Plan 12/09/2017 4:43 PM Leonard Guerrero [id number]    PATIENT STRESSORS: Health problems Substance abuse   PATIENT STRENGTHS: Capable of independent living Supportive family/friends   PATIENT IDENTIFIED PROBLEMS: "detox"  Suicidal Ideation  Depression  Alcohol Abuse               DISCHARGE CRITERIA:  Adequate post-discharge living arrangements Motivation to continue treatment in a less acute level of care  PRELIMINARY DISCHARGE PLAN: Outpatient therapy Return to previous living arrangement  PATIENT/FAMILY INVOLVEMENT: This treatment plan has been presented to and reviewed with the patient, Leonard Guerrero, and/or family member, .  The patient and family have been given the opportunity to ask questions and make suggestions.  Clarene Critchley, RN 12/09/2017, 4:43 PM

## 2017-12-09 NOTE — Consult Note (Addendum)
[place] Psychiatry Consult   Reason for Consult:  Suicidal ideation Referring Physician:  EDP Patient Identification: Leonard Guerrero MRN:  [id number] Principal Diagnosis: Suicide attempt [facility]) Diagnosis:   Patient Active Problem List   Diagnosis Date Noted  . Schizoaffective disorder, depressive type ([place]) [F25.1] 06/15/2017  . Schizophrenia ([place]) [F20.9] 10/23/2016  . MDD (major depressive disorder), recurrent, severe, with psychosis ([place]) [F33.3] 10/23/2016  . Elevated TSH [R79.89] 06/18/2016  . Paranoid schizophrenia ([place]) [F20.0] 06/17/2016  . Major depressive disorder with single episode [F32.9] 06/17/2016  . Alcohol use disorder, severe, dependence ([place]) [F10.20] 06/17/2016  . Cocaine use disorder, moderate, dependence ([place]) [F14.20] 06/17/2016  . Alcoholic intoxication without complication ([place]) [K44.010]   . Acute encephalopathy [G93.40] 06/08/2016  . Acute respiratory failure ([place]) [J96.00]   . Altered mental status [R41.82]   . Encephalopathy acute [G93.40] 06/07/2016  . Chest pain [R07.9] 04/04/2015    Total Time spent with patient: 30 minutes  Subjective:   Leonard Guerrero is a 42 y.o. male patient admitted a/p suicide attempt on 22 Ingrezza and 17 Trazodone.  HPI:   [name] is an 42 y.o. male that presents this date with S/I. y.o. male that presents this date with S/I. Patient had a plan to overdose per notes, reporting that he ingested 22 Ingrezza and 17 Trazodone at 2300 on 12/07/2017. Patient reports he was attempting to take his life because he "was tired." Patient will not elaborate on content and renders limit history. Patient is observed to be tremulous and speaks in a low soft voice that is inaudible at times. Patient is currently impaired and is very drowsy providing limit information. Patient finds it difficult to stay awake as this writer attempts to assess. Patient denies any H/I or AVH shaking his head "no" to most questions. Patient states that he drinks alcohol daily reporting he consumes  varies amounts. Per note review patient was last seen at [facility] on 06/15/17 when he presented at that time with S/I. Patient reports multiple attempts at self harm. Patient states he has been receiving services from [facility] that assists with medication management where he "gets a shot." It is unclear when patient last saw that provider. Patient is unable to complete assessment due to falling asleep. Information to complete assessment was obtained from notes and prior history. Per notes, "Patient's mother reports finding patient unresponsive, on bed, with eyes rolled back. This is patient's 4th OD/SI attempt. Patient has history of multiple suicide attempts before in the past. Patient has a history of paranoid schizophrenia as well as bipolar disorder. He is reported to have taken 22 Ingrezza and 17 trazodone tablets approximately 13 hours ago.  Past Psychiatric History: Schizoaffective disorder, Alcohol abuse  Risk to Self: Yes given suicide attempt.  Risk to Others: None. Denies HI.  Prior Inpatient Therapy: Prior Inpatient Therapy: Yes Prior Therapy Dates: 2019 Prior Therapy Facilty/Provider(s): [facility], [facility] Reason for Treatment: MH issues Prior Outpatient Therapy: Prior Outpatient Therapy: Yes Prior Therapy Dates: Ongoing Prior Therapy Facilty/Provider(s): [facility] Reason for Treatment: Med mang Does patient have an ACCT team?: No Does patient have Intensive In-House Services?  : No Does patient have [facility] services? : Yes Does patient have P4CC services?: No  Past Medical History:  Past Medical History:  Diagnosis Date  . Bipolar 1 disorder ([place])   . ETOH abuse   . Hypertension   . Paranoid schizophrenia [facility])     Past Surgical History:  Procedure Laterality Date  . DENTAL SURGERY     complete extraction   Family History:  Family History  Problem Relation Age of Onset  . Mental illness Father   . Heart attack Father   . Hypertension Mother   . Mental illness Sister    Family  Psychiatric  History: As listed above.  Social History:  Social History   Substance and Sexual Activity  Alcohol Use Yes  . Alcohol/week: 3.0 - 4.0 standard drinks  . Types: 3 - 4 Cans of beer per week     Social History   Substance and Sexual Activity  Drug Use Not on file    Social History   Socioeconomic History  . Marital status: Single    Spouse name: Not on file  . Number of children: Not on file  . Years of education: Not on file  . Highest education level: Not on file  Occupational History  . Not on file  Social Needs  . Financial resource strain: Not on file  . Food insecurity:    Worry: Not on file    Inability: Not on file  . Transportation needs:    Medical: Not on file    Non-medical: Not on file  Tobacco Use  . Smoking status: Current Every Day Smoker    Packs/day: 1.00    Years: 10.00    Pack years: 10.00    Types: Cigarettes  . Smokeless tobacco: Never Used  Substance and Sexual Activity  . Alcohol use: Yes    Alcohol/week: 3.0 - 4.0 standard drinks    Types: 3 - 4 Cans of beer per week  . Drug use: Not on file  . Sexual activity: Not on file  Lifestyle  . Physical activity:    Days per week: Not on file    Minutes per session: Not on file  . Stress: Not on file  Relationships  . Social connections:    Talks on phone: Not on file    Gets together: Not on file    Attends religious service: Not on file    Active member of club or organization: Not on file    Attends meetings of clubs or organizations: Not on file    Relationship status: Not on file  Other Topics Concern  . Not on file  Social History Narrative   ** Merged History Encounter **       Additional Social History: He has children who live outside the state of [place]. " My kids only call when they wont something." He lives alone.   Allergies:  No Known Allergies  Labs:  Results for orders placed or performed during the hospital encounter of 12/08/17 (from the past 48 hour(s))   Comprehensive metabolic panel     Status: Abnormal   Collection Time: 12/08/17 12:26 PM  Result Value Ref Range   Sodium 137 135 - 145 mmol/L   Potassium 4.1 3.5 - 5.1 mmol/L   Chloride 99 98 - 111 mmol/L   CO2 26 22 - 32 mmol/L   Glucose, Bld 113 (H) 70 - 99 mg/dL   BUN 11 6 - 20 mg/dL   Creatinine, Ser 0.97 0.61 - 1.24 mg/dL   Calcium 9.6 8.9 - 10.3 mg/dL   Total Protein 8.0 6.5 - 8.1 g/dL   Albumin 4.4 3.5 - 5.0 g/dL   AST 94 (H) 15 - 41 U/L   ALT 93 (H) 0 - 44 U/L   Alkaline Phosphatase 48 38 - 126 U/L   Total Bilirubin 1.1 0.3 - 1.2 mg/dL   GFR calc non Af Amer >  60 >60 mL/min   GFR calc Af Amer >60 >60 mL/min    Comment: (NOTE) The eGFR has been calculated using the CKD EPI equation. This calculation has not been validated in all clinical situations. eGFR's persistently <60 mL/min signify possible Chronic Kidney Disease.    Anion gap 12 5 - 15    Comment: Performed at [facility], [place] [address] [place], [place] [address]  Ethanol     Status: None   Collection Time: 12/08/17 12:26 PM  Result Value Ref Range   Alcohol, Ethyl (B) <10 <10 mg/dL    Comment: (NOTE) Lowest detectable limit for serum alcohol is 10 mg/dL. For medical purposes only. Performed at [facility], [place] [address] [place], [place] [address]   Salicylate level     Status: None   Collection Time: 12/08/17 12:26 PM  Result Value Ref Range   Salicylate Lvl <8.8 2.8 - 30.0 mg/dL    Comment: Performed at [facility], [place] [address] [place], [place] [address]  Acetaminophen level     Status: Abnormal   Collection Time: 12/08/17 12:26 PM  Result Value Ref Range   Acetaminophen (Tylenol), Serum <10 (L) 10 - 30 ug/mL    Comment: (NOTE) Therapeutic concentrations vary significantly. A range of 10-30 ug/mL  may be an effective concentration for many patients. However, some  are best treated at concentrations outside of this  range. Acetaminophen concentrations >150 ug/mL at 4 hours after ingestion  and >50 ug/mL at 12 hours after ingestion are often associated with  toxic reactions. Performed at [facility], [place] [address] [place], [place] [address]   cbc     Status: Abnormal   Collection Time: 12/08/17 12:26 PM  Result Value Ref Range   WBC 6.7 4.0 - 10.5 K/uL   RBC 4.78 4.22 - 5.81 MIL/uL   Hemoglobin 15.9 13.0 - 17.0 g/dL   HCT 45.7 39.0 - 52.0 %   MCV 95.6 80.0 - 100.0 fL   MCH 33.3 26.0 - 34.0 pg   MCHC 34.8 30.0 - 36.0 g/dL   RDW 13.2 11.5 - 15.5 %   Platelets 102 (L) 150 - 400 K/uL    Comment: REPEATED TO VERIFY PLATELET COUNT CONFIRMED BY SMEAR SPECIMEN CHECKED FOR CLOTS Immature Platelet Fraction may be clinically indicated, consider ordering this additional test [id number]    nRBC 0.0 0.0 - 0.2 %    Comment: Performed at [facility], [place] [address], [place], [place] [address]  Rapid urine drug screen (hospital performed)     Status: None   Collection Time: 12/08/17 12:26 PM  Result Value Ref Range   Opiates NONE DETECTED NONE DETECTED   Cocaine NONE DETECTED NONE DETECTED   Benzodiazepines NONE DETECTED NONE DETECTED   Amphetamines NONE DETECTED NONE DETECTED   Tetrahydrocannabinol NONE DETECTED NONE DETECTED   Barbiturates NONE DETECTED NONE DETECTED    Comment: (NOTE) DRUG SCREEN FOR MEDICAL PURPOSES ONLY.  IF CONFIRMATION IS NEEDED FOR ANY PURPOSE, NOTIFY LAB WITHIN 5 DAYS. LOWEST DETECTABLE LIMITS FOR URINE DRUG SCREEN Drug Class                     Cutoff (ng/mL) Amphetamine and metabolites    1000 Barbiturate and metabolites    200 Benzodiazepine                 655 Tricyclics and metabolites     300 Opiates and metabolites  300 Cocaine and metabolites        300 THC                            50 Performed at [facility], [place] [address], [place], [place] [address]   CBG monitoring, ED     Status:  Abnormal   Collection Time: 12/08/17  1:02 PM  Result Value Ref Range   Glucose-Capillary 125 (H) 70 - 99 mg/dL    Current Facility-Administered Medications  Medication Dose Route Frequency Provider Last Rate Last Dose  . diphenhydrAMINE (BENADRYL) injection 25 mg  25 mg Intramuscular Once Suella Broad, FNP      . LORazepam (ATIVAN) injection 0-4 mg  0-4 mg Intravenous Q6H Virgel Manifold, MD       Or  . LORazepam (ATIVAN) tablet 0-4 mg  0-4 mg Oral Q6H Virgel Manifold, MD   1 mg at 12/08/17 2221  . [START ON 12/11/2017] LORazepam (ATIVAN) injection 0-4 mg  0-4 mg Intravenous Q12H Virgel Manifold, MD       Or  . Derrill Memo ON 12/11/2017] LORazepam (ATIVAN) tablet 0-4 mg  0-4 mg Oral Q12H Virgel Manifold, MD      . ondansetron [facility]) tablet 4 mg  4 mg Oral Q8H PRN Virgel Manifold, MD      . thiamine (VITAMIN B-1) tablet 100 mg  100 mg Oral Daily Virgel Manifold, MD   100 mg at 12/09/17 1055   Or  . thiamine (B-1) injection 100 mg  100 mg Intravenous Daily Virgel Manifold, MD       Current Outpatient Medications  Medication Sig Dispense Refill  . gabapentin (NEURONTIN) 100 MG capsule Take 2 capsules (200 mg total) by mouth 2 (two) times daily. 120 capsule 0  . hydrOXYzine (ATARAX/VISTARIL) 25 MG tablet Take 1 tablet (25 mg total) by mouth every 6 (six) hours as needed for anxiety. 30 tablet 0  . paliperidone (INVEGA SUSTENNA) 156 MG/ML SUSP injection Inject 0.75 mLs (117 mg total) into the muscle every 28 (twenty-eight) days. 0.9 mL 0  . paliperidone (INVEGA) 6 MG 24 hr tablet Take 1 tablet (6 mg total) by mouth daily. 30 tablet 0  . traZODone (DESYREL) 100 MG tablet Take 2 tablets (200 mg total) by mouth at bedtime as needed for sleep. 60 tablet 0  . escitalopram (LEXAPRO) 5 MG tablet Take 3 tablets (15 mg total) by mouth daily. (Patient not taking: Reported on 12/09/2017) 90 tablet 0  . Melatonin 5 MG TABS Take 10 mg by mouth at bedtime as needed (sleep).     . nicotine (NICODERM CQ -  DOSED IN MG/24 HOURS) 21 mg/24hr patch Place 1 patch (21 mg total) onto the skin daily at 6 (six) AM. (Patient not taking: Reported on 12/09/2017) 28 patch 0  . trihexyphenidyl (ARTANE) 5 MG tablet Take 1 tablet (5 mg total) by mouth 3 (three) times daily with meals. 90 tablet 0    Musculoskeletal: Strength & Muscle Tone: within normal limits Gait & Station: normal Patient leans: N/A  Psychiatric Specialty Exam: Physical Exam  Nursing note and vitals reviewed. Constitutional: He is oriented to person, place, and time. He appears well-developed. He appears distressed.  HENT:  Head: Normocephalic and atraumatic.  Neck: Normal range of motion.  Respiratory: Effort normal.  Musculoskeletal: Normal range of motion.  Neurological: He is alert and oriented to person, place, and time.  Psychiatric: His speech is normal and  behavior is normal. Thought content normal. Cognition and memory are normal. He expresses impulsivity. He exhibits a depressed mood.    Review of Systems  Psychiatric/Behavioral: Positive for depression and substance abuse. Negative for hallucinations, memory loss and suicidal ideas. The patient is not nervous/anxious and does not have insomnia.   All other systems reviewed and are negative.   Blood pressure 136/86, pulse 64, temperature (!) 97.5 F (36.4 C), temperature source Oral, resp. rate 18, height 6' (1.829 m), weight 87.5 kg, SpO2 95 %.Body mass index is 26.18 kg/m.  General Appearance: Disheveled  Eye Contact:  Fair  Speech:  Clear and Coherent  Volume:  Decreased  Mood:  Depressed  Affect:  Depressed and Flat  Thought Process:  Coherent, Linear and Descriptions of Associations: Intact  Orientation:  Full (Time, Place, and Person)  Thought Content:  Logical  Suicidal Thoughts:  Yes.  with intent/plan  Homicidal Thoughts:  No  Memory:  Immediate;   Fair Recent;   Poor Remote;   Poor  Judgement:  Poor  Insight:  Shallow  Psychomotor Activity:  Increased,  TD and Tremor  Concentration:  Concentration: Fair and Attention Span: Fair  Recall:  [employer] of Knowledge:  Fair  Language:  Fair  Akathisia:  No  Handed:  Right  AIMS (if indicated):   N/A  Assets:  Agricultural consultant Housing  ADL's:  Intact  Cognition:  WNL  Sleep:   N/A     Treatment Plan Summary: Plan Schizoaffective disorder, depressive type ([place])  Discharge Home Take all medications as prescribed Avoid the use of alcohol and illicit drugs Follow up at [facility]  Disposition: Recommend psychiatric Inpatient admission when medically cleared. Will admit to Inpatient Unit [facility] 532-0, accepting [place] Sheppard Evens.   Suella Broad, FNP 12/09/2017 12:32 PM    Patient seen face-to-face for psychiatric evaluation, chart reviewed and case discussed with the physician extender and developed treatment plan. Reviewed the information documented and agree with the treatment plan.  Buford Dresser, DO 12/09/17 4:03 PM

## 2017-12-09 NOTE — Progress Notes (Signed)
Psychoeducational Group Note  Date:  12/09/2017 Time:  2111  Group Topic/Focus:  Wrap-Up Group:   The focus of this group is to help patients review their daily goal of treatment and discuss progress on daily workbooks.  Participation Level: Did Not Attend  Participation Quality:  Not Applicable  Affect:  Not Applicable  Cognitive:  Not Applicable  Insight:  Not Applicable  Engagement in Group: Not Applicable  Additional Comments:  The patient refused to attend group and walked back and forth in the hallway.   Hazle Coca S 12/09/2017, 9:11 PM

## 2017-12-09 NOTE — BH Assessment (Addendum)
[facility] Assessment Progress Note  Per Juanetta Beets, DO, this pt requires psychiatric hospitalization at this time.  Malva Limes, RN, [facility] has assigned pt to [facility] Rm 161-0; she will call when [facility] is ready to receive pt.  Pt has signed Voluntary Admission and Consent for Treatment, and signed form has been faxed to [facility].  Please note that due to tremulousness, pt's fine motor skills are impaired, and pt's signature is illegible.  Pt's nurse, Kendal Hymen, has been notified, and agrees to send original paperwork along with pt via Juel Burrow, and to call report to [phone].  Doylene Canning, [place] Behavioral Health Coordinator [phone]   Addendum:  At 12:42 Linsey calls from [facility] to change room assignment to 506-1.  [facility] is now ready to receive pt.  Kendal Hymen has been notified.  Doylene Canning, [place] [facility] Coordinator [phone]

## 2017-12-09 NOTE — ED Provider Notes (Signed)
Pt is well appearing at this time. This is not a life threatening overdose. He is medically cleared at this time. Disposition per the psychiatry team   Azalia Bilis, MD 12/09/17 1038

## 2017-12-09 NOTE — ED Notes (Signed)
Pt resting in bed with even, unlabored respirations. No distress noted. No needs expressed. Will continue to monitor.

## 2017-12-09 NOTE — Progress Notes (Signed)
Admission Note: Patient is a 42 year old male admitted to the unit with diagnoses of suicidal ideation, alcohol abuse and depression.  Patient is alert and oriented to person, place and time.  Patient currently denies suicidal ideation, auditory and visual hallucinations.  Patient states he is here for detox and alcohol abuse.  Patient presents with anxious affect and depressed mood.  Appears to be very tremulous with soft slow speech.  Admission plan of care reviewed and consent signed.  Skin assessment and personal belongings completed.  Skin is intact and dry.  No contraband found.  Patient oriented to the unit, staff and room.  Routine safety checks initiated.  Verbalizes understanding of unit rules and protocols.  Patient is safe on the unit.

## 2017-12-09 NOTE — ED Notes (Signed)
MD stated patient was clear to be transferred to [facility].

## 2017-12-10 DIAGNOSIS — F10230 Alcohol dependence with withdrawal, uncomplicated: Secondary | ICD-10-CM

## 2017-12-10 DIAGNOSIS — F251 Schizoaffective disorder, depressive type: Principal | ICD-10-CM

## 2017-12-10 LAB — HEMOGLOBIN A1C
Hgb A1c MFr Bld: 4.9 % (ref 4.8–5.6)
Mean Plasma Glucose: 93.93 mg/dL

## 2017-12-10 LAB — LIPID PANEL
CHOL/HDL RATIO: 3.2 ratio
Cholesterol: 230 mg/dL — ABNORMAL HIGH (ref 0–200)
HDL: 72 mg/dL (ref >40)
LDL CALC: 135 mg/dL — AB (ref 0–99)
Triglycerides: 115 mg/dL (ref <150)
VLDL: 23 mg/dL (ref 0–40)

## 2017-12-10 LAB — TSH: TSH: 5.081 u[IU]/mL — AB (ref 0.350–4.500)

## 2017-12-10 MED ORDER — LORAZEPAM 1 MG PO TABS: 1.0000 mg | ORAL_TABLET | Freq: Four times a day (QID) | ORAL | Status: DC

## 2017-12-10 MED ORDER — ONDANSETRON 4 MG PO TBDP: 4.0000 mg | ORAL_TABLET | Freq: Four times a day (QID) | ORAL | Status: DC | PRN

## 2017-12-10 MED ORDER — ADULT MULTIVITAMIN W/MINERALS CH
1.0000 | ORAL_TABLET | Freq: Every day | ORAL | Status: DC
  Administered 2017-12-10 – 2017-12-12 (×3): 1 via ORAL
  Filled 2017-12-10 (×6): qty 1

## 2017-12-10 MED ORDER — MIRTAZAPINE 15 MG PO TBDP
30.0000 mg | ORAL_TABLET | Freq: Every day | ORAL | Status: DC
  Administered 2017-12-10 – 2017-12-11 (×2): 30 mg via ORAL
  Filled 2017-12-10 (×6): qty 2

## 2017-12-10 MED ORDER — THIAMINE HCL 100 MG/ML IJ SOLN
100.0000 mg | Freq: Once | INTRAMUSCULAR | Status: AC
  Administered 2017-12-10: 100 mg via INTRAMUSCULAR
  Filled 2017-12-10: qty 2

## 2017-12-10 MED ORDER — DIAZEPAM 5 MG PO TABS
10.0000 mg | ORAL_TABLET | Freq: Three times a day (TID) | ORAL | Status: DC
  Administered 2017-12-10 – 2017-12-11 (×3): 10 mg via ORAL
  Filled 2017-12-10 (×3): qty 2

## 2017-12-10 MED ORDER — HYDROXYZINE HCL 25 MG PO TABS
25.0000 mg | ORAL_TABLET | Freq: Four times a day (QID) | ORAL | Status: DC | PRN
  Administered 2017-12-10 – 2017-12-11 (×2): 25 mg via ORAL
  Filled 2017-12-10 (×2): qty 1

## 2017-12-10 MED ORDER — LOPERAMIDE HCL 2 MG PO CAPS: 2.0000 mg | ORAL_CAPSULE | ORAL | Status: DC | PRN

## 2017-12-10 MED ORDER — DM-GUAIFENESIN ER 30-600 MG PO TB12: 1.0000 | ORAL_TABLET | Freq: Two times a day (BID) | ORAL | Status: DC

## 2017-12-10 MED ORDER — VITAMIN B-1 100 MG PO TABS
100.0000 mg | ORAL_TABLET | Freq: Every day | ORAL | Status: DC
  Administered 2017-12-11 – 2017-12-12 (×2): 100 mg via ORAL
  Filled 2017-12-10 (×5): qty 1

## 2017-12-10 MED ORDER — LORAZEPAM 1 MG PO TABS: 1.0000 mg | ORAL_TABLET | Freq: Two times a day (BID) | ORAL | Status: DC

## 2017-12-10 MED ORDER — GABAPENTIN 300 MG PO CAPS
300.0000 mg | ORAL_CAPSULE | Freq: Three times a day (TID) | ORAL | Status: DC
  Administered 2017-12-10 – 2017-12-12 (×5): 300 mg via ORAL
  Filled 2017-12-10 (×12): qty 1

## 2017-12-10 MED ORDER — DM-GUAIFENESIN ER 30-600 MG PO TB12
1.0000 | ORAL_TABLET | Freq: Two times a day (BID) | ORAL | Status: DC
  Administered 2017-12-10 – 2017-12-12 (×5): 1 via ORAL
  Filled 2017-12-10 (×9): qty 1

## 2017-12-10 MED ORDER — LORAZEPAM 1 MG PO TABS: 1.0000 mg | ORAL_TABLET | Freq: Three times a day (TID) | ORAL | Status: DC

## 2017-12-10 MED ORDER — MENTHOL 3 MG MT LOZG: 1.0000 | LOZENGE | OROMUCOSAL | Status: DC | PRN

## 2017-12-10 MED ORDER — LORAZEPAM 1 MG PO TABS: 1.0000 mg | ORAL_TABLET | Freq: Every day | ORAL | Status: DC

## 2017-12-10 MED ORDER — LORAZEPAM 1 MG PO TABS
1.0000 mg | ORAL_TABLET | Freq: Four times a day (QID) | ORAL | Status: DC | PRN
  Administered 2017-12-11: 1 mg via ORAL
  Filled 2017-12-10: qty 1

## 2017-12-10 NOTE — H&P (Signed)
Psychiatric Admission Assessment Adult  Patient Identification: Leonard Guerrero MRN:  [id number] Date of Evaluation:  12/10/2017 Chief Complaint:  SCHIZOAFFECTIVE DISORDER; DEPRESSED TYPE ALCOHOL ABUSE  Principal Diagnosis: as above- request detox Diagnosis:  Schizoaffective depressed/ETOH dep Patient Active Problem List   Diagnosis Date Noted  . Suicide attempt (HCC) [T14.91XA]   . Schizoaffective disorder, depressive type (HCC) [F25.1] 06/15/2017  . Schizophrenia (HCC) [F20.9] 10/23/2016  . MDD (major depressive disorder), recurrent, severe, with psychosis (HCC) [F33.3] 10/23/2016  . Elevated TSH [R79.89] 06/18/2016  . Paranoid schizophrenia (HCC) [F20.0] 06/17/2016  . Major depressive disorder with single episode [F32.9] 06/17/2016  . Alcohol use disorder, severe, dependence (HCC) [F10.20] 06/17/2016  . Cocaine use disorder, moderate, dependence (HCC) [F14.20] 06/17/2016  . Alcoholic intoxication without complication (HCC) [F10.920]   . Acute encephalopathy [G93.40] 06/08/2016  . Acute respiratory failure (HCC) [J96.00]   . Altered mental status [R41.82]   . Encephalopathy acute [G93.40] 06/07/2016  . Chest pain [R07.9] 04/04/2015   History of Present Illness:  Pt is the reliable historian, presented seeking detox from "15 beers a day" at least- client at [place] states he receives monthly Invega LAI shots for "schizophrenia" Reports no h/o seizures/moderate tremors in exam/2 DWI's in past/no black-outs No acute SI or psychosis- yet states he took overdose 11/4 Acc to intake: By Dr Freida Busman: "42 year old male presents after intentional ingestion yesterday with plan for suicide.  Patient has history of multiple suicide attempt before in the past.  Has a history of paranoid schizophrenia as well as bipolar disorder.  He is reported to have taken 22 Ingrezza and 17 trazodone tablets approximately 13 hours ago.  He would not disclose why he did this.  Denies any auditory hallucinations.   Patient also drinks 15 beers a day and his last drink was over 16 hours ago.  He has had some dry heaves which is been nonbilious or bloody.  Has also had some sweating" Associated Signs/Symptoms: Depression Symptoms:  fatigue, (Hypo) Manic Symptoms:  Impulsivity, Anxiety Symptoms:  Panic Symptoms, Psychotic Symptoms:  n/a PTSD Symptoms: Negative Total Time spent with patient: 45 minutes  Past Psychiatric History: as above- TD/hepatitis/schizoaff-/ETOH dep  Is the patient at risk to self? No.  Has the patient been a risk to self in the past 6 months? Yes.    Has the patient been a risk to self within the distant past? Yes.    Is the patient a risk to others? No.  Has the patient been a risk to others in the past 6 months? Yes.    Has the patient been a risk to others within the distant past? No.   Prior Inpatient Therapy: [facility]  x 1 Prior Outpatient Therapy: [facility]- active Alcohol Screening: 1. How often do you have a drink containing alcohol?: 2 to 4 times a month 2. How many drinks containing alcohol do you have on a typical day when you are drinking?: 5 or 6 3. How often do you have six or more drinks on one occasion?: Weekly AUDIT-C Score: 7 4. How often during the last year have you found that you were not able to stop drinking once you had started?: Never 5. How often during the last year have you failed to do what was normally expected from you becasue of drinking?: Never 6. How often during the last year have you needed a first drink in the morning to get yourself going after a heavy drinking session?: Never 7. How often during the last year have  you had a feeling of guilt of remorse after drinking?: Never 8. How often during the last year have you been unable to remember what happened the night before because you had been drinking?: Never 9. Have you or someone else been injured as a result of your drinking?: Yes, but not in the last year 10. Has a relative or friend  or a doctor or another health worker been concerned about your drinking or suggested you cut down?: Yes, but not in the last year Alcohol Use Disorder Identification Test Final Score (AUDIT): 11 Intervention/Follow-up: Continued Monitoring, Alcohol Education Substance Abuse History in the last 12 months:  Yes.   Consequences of Substance Abuse: Withdrawal Symptoms:   Diaphoresis Tremors Previous Psychotropic Medications: Yes  Psychological Evaluations: Yes  Past Medical History:  Past Medical History:  Diagnosis Date  . Bipolar 1 disorder (HCC)   . ETOH abuse   . Hypertension   . Paranoid schizophrenia [facility])     Past Surgical History:  Procedure Laterality Date  . DENTAL SURGERY     complete extraction   Family History:  Family History  Problem Relation Age of Onset  . Mental illness Father   . Heart attack Father   . Hypertension Mother   . Mental illness Sister    Family Psychiatric  History: neg Tobacco Screening: Have you used any form of tobacco in the last 30 days? (Cigarettes, Smokeless Tobacco, Cigars, and/or Pipes): Yes Tobacco use, Select all that apply: 5 or more cigarettes per day Are you interested in Tobacco Cessation Medications?: Yes, will notify MD for an order Counseled patient on smoking cessation including recognizing danger situations, developing coping skills and basic information about quitting provided: Yes Social History:  Social History   Substance and Sexual Activity  Alcohol Use Yes  . Alcohol/week: 3.0 - 4.0 standard drinks  . Types: 3 - 4 Cans of beer per week     Social History   Substance and Sexual Activity  Drug Use Not on file    Allergies:  No Known Allergies Lab Results:  Results for orders placed or performed during the hospital encounter of 12/09/17 (from the past 48 hour(s))  Hemoglobin A1c     Status: None   Collection Time: 12/10/17  6:19 AM  Result Value Ref Range   Hgb A1c MFr Bld 4.9 4.8 - 5.6 %    Comment:  (NOTE) Pre diabetes:          5.7%-6.4% Diabetes:              >6.4% Glycemic control for   <7.0% adults with diabetes    Mean Plasma Glucose 93.93 mg/dL    Comment: Performed at [facility], [address], [place], [place] [address]  Lipid panel     Status: Abnormal   Collection Time: 12/10/17  6:19 AM  Result Value Ref Range   Cholesterol 230 (H) 0 - 200 mg/dL   Triglycerides 829 <562 mg/dL   HDL 72 >13 mg/dL   Total CHOL/HDL Ratio 3.2 RATIO   VLDL 23 0 - 40 mg/dL   LDL Cholesterol 086 (H) 0 - 99 mg/dL    Comment:        Total Cholesterol/HDL:CHD Risk Coronary Heart Disease Risk Table                     Men   Women  1/2 Average Risk   3.4   3.3  Average Risk  5.0   4.4  2 X Average Risk   9.6   7.1  3 X Average Risk  23.4   11.0        Use the calculated Patient Ratio above and the CHD Risk Table to determine the patient's CHD Risk.        ATP III CLASSIFICATION (LDL):  <100     mg/dL   Optimal  161-096  mg/dL   Near or Above                    Optimal  130-159  mg/dL   Borderline  045-409  mg/dL   High  >811     mg/dL   Very High Performed at [facility], [address], [place], [place] [address]   TSH     Status: Abnormal   Collection Time: 12/10/17  6:19 AM  Result Value Ref Range   TSH 5.081 (H) 0.350 - 4.500 uIU/mL    Comment: Performed by a 3rd Generation assay with a functional sensitivity of <=0.01 uIU/mL. Performed at [facility], [address] [place], [place] [address]     Blood Alcohol level:  Lab Results  Component Value Date   ETH <10 12/08/2017   ETH 230 (H) 06/15/2017    Metabolic Disorder Labs:  Lab Results  Component Value Date   HGBA1C 4.9 12/10/2017   MPG 93.93 12/10/2017   MPG 100 06/18/2016   Lab Results  Component Value Date   PROLACTIN 28.5 (H) 06/18/2016   Lab Results  Component Value Date   CHOL 230 (H) 12/10/2017   TRIG 115 12/10/2017   HDL 72 12/10/2017   CHOLHDL 3.2  12/10/2017   VLDL 23 12/10/2017   LDLCALC 135 (H) 12/10/2017   LDLCALC 142 (H) 06/18/2016    Current Medications: Current Facility-Administered Medications  Medication Dose Route Frequency Provider Last Rate Last Dose  . acetaminophen (TYLENOL) tablet 650 mg  650 mg Oral Q6H PRN Maryagnes Amos, FNP      . alum & mag hydroxide-simeth (MAALOX/MYLANTA) 200-200-20 MG/5ML suspension 30 mL  30 mL Oral Q4H PRN Rosario Adie, Juel Burrow, FNP      . dextromethorphan-guaiFENesin (MUCINEX DM) 30-600 MG per 12 hr tablet 1 tablet  1 tablet Oral BID Malvin Johns, MD      . diazepam (VALIUM) tablet 10 mg  10 mg Oral TID Malvin Johns, MD      . escitalopram (LEXAPRO) tablet 15 mg  15 mg Oral Daily Maryagnes Amos, FNP   15 mg at 12/10/17 0758  . gabapentin (NEURONTIN) capsule 300 mg  300 mg Oral TID Malvin Johns, MD      . hydrOXYzine (ATARAX/VISTARIL) tablet 25 mg  25 mg Oral Q6H PRN Armandina Stammer I, NP      . loperamide (IMODIUM) capsule 2-4 mg  2-4 mg Oral PRN Nwoko, Agnes I, NP      . LORazepam (ATIVAN) tablet 1 mg  1 mg Oral Q6H PRN Nwoko, Agnes I, NP      . magnesium hydroxide (MILK OF MAGNESIA) suspension 30 mL  30 mL Oral Daily PRN Starkes-Perry, Juel Burrow, FNP      . multivitamin with minerals tablet 1 tablet  1 tablet Oral Daily Armandina Stammer I, NP   1 tablet at 12/10/17 0952  . nicotine (NICODERM CQ - dosed in mg/24 hours) patch 21 mg  21 mg Transdermal Q0600 Maryagnes Amos, FNP   21 mg at  12/10/17 0757  . ondansetron (ZOFRAN-ODT) disintegrating tablet 4 mg  4 mg Oral Q6H PRN Sanjuana Kava, NP      . Melene Muller ON 12/11/2017] thiamine (VITAMIN B-1) tablet 100 mg  100 mg Oral Daily Nwoko, Agnes I, NP       PTA Medications: Medications Prior to Admission  Medication Sig Dispense Refill Last Dose  . escitalopram (LEXAPRO) 5 MG tablet Take 3 tablets (15 mg total) by mouth daily. (Patient not taking: Reported on 12/09/2017) 90 tablet 0 Not Taking at Unknown time  . gabapentin (NEURONTIN)  100 MG capsule Take 2 capsules (200 mg total) by mouth 2 (two) times daily. 120 capsule 0 Past Week at Unknown time  . hydrOXYzine (ATARAX/VISTARIL) 25 MG tablet Take 1 tablet (25 mg total) by mouth every 6 (six) hours as needed for anxiety. 30 tablet 0 Past Month at Unknown time  . nicotine (NICODERM CQ - DOSED IN MG/24 HOURS) 21 mg/24hr patch Place 1 patch (21 mg total) onto the skin daily at 6 (six) AM. (Patient not taking: Reported on 12/09/2017) 28 patch 0 Not Taking at Unknown time    Musculoskeletal: Strength & Muscle Tone: within normal limits Gait & Station: normal Patient leans: N/A  Psychiatric Specialty Exam: Physical Exam  ROS  Blood pressure (!) 110/91, pulse 87, temperature 98.1 F (36.7 C), temperature source Oral, resp. rate 16, height 6' (1.829 m), weight 83.9 kg, SpO2 98 %.Body mass index is 25.09 kg/m.  General Appearance: Casual  Eye Contact:  Good  Speech:  Clear and Coherent  Volume:  Normal  Mood:  Dysphoric  Affect:  Depressed  Thought Process:  Coherent  Orientation:  Full (Time, Place, and Person)  Thought Content:  Rumination  Suicidal Thoughts:  No  Homicidal Thoughts:  No  Memory:  Recent;   Fair  Judgement:  Intact  Insight:  Fair  Psychomotor Activity:  Decreased  Concentration:  Concentration: Good  Recall:  Good  Fund of Knowledge:  Good  Language:  Good  Akathisia:  Yes  Handed:  Right  AIMS (if indicated):  22  Assets:  Communication Skills  ADL's:  Intact  Cognition:  WNL  Sleep:  Number of Hours: 6.75    Treatment Plan Summary: Daily contact with patient to assess and evaluate symptoms and progress in treatment  Observation Level/Precautions:  15 minute checks  Laboratory:  none new  Psychotherapy:  Rehab based  Medications:  lai  Consultations:  n/a  Discharge Concerns:  Safety   Estimated LOS:4 d  Other:  Restart ingrezza at d-c     I. Schizoaffective depressed- recent o-d no acute SI/contrtacts here  ETOH dep and  withdrawal II defer III alc hepatitis/withdrwal tremors+TD+ EPS       Physician Treatment Plan for Primary Diagnosis: ETOH dep-SI Long Term Goal(s): Improvement in symptoms so as ready for discharge  Short Term Goals: Ability to identify changes in lifestyle to reduce recurrence of condition will improve  Physician Treatment Plan for Secondary Diagnosis: Active Problems:   Schizophrenia (HCC)  Long Term Goal(s): Improvement in symptoms so as ready for discharge  Short Term Goals: Ability to identify changes in lifestyle to reduce recurrence of condition will improve  I certify that inpatient services furnished can reasonably be expected to improve the patient's condition.    Malvin Johns, MD 11/6/201910:20 AM

## 2017-12-10 NOTE — Progress Notes (Signed)
Psychoeducational Group Note  Date:  12/10/2017 Time:  2038  Group Topic/Focus:  Wrap-Up Group:   The focus of this group is to help patients review their daily goal of treatment and discuss progress on daily workbooks.  Participation Level: Did Not Attend  Participation Quality:  Not Applicable  Affect:  Not Applicable  Cognitive:  Not Applicable  Insight:  Not Applicable  Engagement in Group: Not Applicable  Additional Comments:  The patient did not attend group but was awake and was moving around in the hallway.   Hazle Coca S 12/10/2017, 8:38 PM

## 2017-12-10 NOTE — Progress Notes (Signed)
Recreation Therapy Notes  Date: 11.6.19 Time: 1000 Location: 500 Hall Dayroom  Group Topic: Self-Esteem  Goal Area(s) Addresses:  Patient will successfully identify positive attributes about themselves.  Patient will successfully identify benefit of improved self-esteem.   Intervention: Scientist, clinical (histocompatibility and immunogenetics), scissors, glue stick, magazines  Activity: Brochure About Me.  Patients were to use the supplies provided to create a collage that highlighted things they like, things they would like to do and good qualities about them.    Education:  Self-Esteem, Building control surveyor.   Education Outcome: Acknowledges education/In group clarification offered/Needs additional education  Clinical Observations/Feedback: Pt did not attend group.    Caroll Rancher, LRT/CTRS         Lillia Abed, Konica Stankowski A 12/10/2017 11:40 AM

## 2017-12-10 NOTE — [facility] Group Notes (Signed)
[facility] Occupational Therapist Group Therapy Note  Date/Time: 12/10/17, 0900  Type of Therapy/Topic:  Stress Management  Participation Level:  Did Not Attend   Mood:  Description of Group:    The purpose of this group is to assist patients in learning to develop effective methods to manage stress.  Patients will discuss causes of stress in their lives, potential new strategies to manage stress, and will participate in a group activity to put this into practice.   Therapeutic Goals: 1. Patient will identify two specific strategies to manage stress. 2. Patient will identify and discuss situations that cause stress in their lives.   Summary of Patient Progress:         Greg Callahan Wild, LCSW  

## 2017-12-10 NOTE — [facility] Counselor (Signed)
Adult Comprehensive Assessment  Patient ID: Vincent Streater, male   DOB: November 15, 1975, 42 y.o.   MRN: [id number]  Information Source: Information source: Patient  Current Stressors:  Patient states their primary concerns and needs for treatment are:: "Trying to get clean" Patient states their goals for this hospitilization and ongoing recovery are:: "Detox" Educational / Learning stressors: He cannot read Family Relationships: Jb reported that he cannot stop drinking and he wants to be well enough to visit his daughter and grandkids in [place] for Thanksgiving Substance abuse: He is experiencing tremors from withdrawal, "I can't do anything cause I'm shaking"  Living/Environment/Situation:  Living Arrangements: Alone Living conditions (as described by patient or guardian): He is living in a trailer on his aunt's property  Who else lives in the home?: None reported How long has patient lived in current situation?: 1 month What is atmosphere in current home: Comfortable, Temporary   Family History:  Marital status: Divorced Divorced, when?: 14 years ago, pt divorced his 2nd wife.  What types of issues is patient dealing with in the relationship?: alcoholism Additional relationship information: n/a  Are you sexually active?: Yes What is your sexual orientation?: heterosexual Has your sexual activity been affected by drugs, alcohol, medication, or emotional stress?: n/a  Does patient have children?: Yes How many children?: 6 How is patient's relationship with their children?: 3 boys; 3 girls. "I don't have a relationship with any of them but my oldest daughter. "I'm moving in with her in [facility] when I leave the hospital."  Childhood History:  By whom was/is the patient raised?: Mother, Grandparents Additional childhood history information: pt's maternal grandparents raised him; "My mom lived 2 houses down." his mother waws 36yo when she gave birth to pt and did not actively care for him  until he was older Description of patient's relationship with caregiver when they were a child: close to grandparents and mother; did not know biological father as a child Patient's description of current relationship with people who raised him/her: close to mother; grandparents deceased; "I met my dad once. We didn't get along very well." How were you disciplined when you got in trouble as a child/adolescent?: n/a  Does patient have siblings?: Yes Number of Siblings: 5 Description of patient's current relationship with siblings: five sisters; one older and 4 younger. "We are all pretty close still."  Did patient suffer any verbal/emotional/physical/sexual abuse as a child?: No Did patient suffer from severe childhood neglect?: No Has patient ever been sexually abused/assaulted/raped as an adolescent or adult?: No Was the patient ever a victim of a crime or a disaster?: No Witnessed domestic violence?: No Has patient been effected by domestic violence as an adult?: No  Education:  Highest grade of school patient has completed: 9th grade. pt got in fights and had behavioral issues so he dropped out in 9th grade  Currently a student?: No Learning disability?: Yes What learning problems does patient have?: Pt reports he is unable to read and significant learning difficulties.   Employment/Work Situation:  Employment situation: On disability Why is patient on disability: mental illness How long has patient been on disability: 2011  Patient's job has been impacted by current illness: No What is the longest time patient has a held a job?: n/a  Where was the patient employed at that time?: n/a  Has patient ever been in the [employer]?: No Has patient ever served in combat?: No Did You Receive Any Psychiatric Treatment/Services While in [employer]?: No Are There Guns  or Other Weapons in [place]?: No Are These [place]?: (n/a)   Financial Resources:   Financial  resources: [employer], [employer], Medicaid Does patient have a Programmer, applications or guardian?: Yes Name of representative payee or guardian: Payee is his "Occupational hygienist"  Alcohol/Substance Abuse:   What has been your use of drugs/alcohol within the last 12 months?: 15 beers a day If attempted suicide, did drugs/alcohol play a role in this?: Yes Alcohol/Substance Abuse Treatment Hx: Denies past history Has alcohol/substance abuse ever caused legal problems?: Yes  Social Support System:   Patient's Community Support System: Fair Describe Community Support System: Lawarence stated that his aunt, his mom, his daughter and grandkids are his supports Type of faith/religion: Unsure How does patient's faith help to cope with current illness?: "I don't know"  Leisure/Recreation:   Leisure and Hobbies: "Drinking and listening to the radio"  Strengths/Needs:   What is the patient's perception of their strengths?: "Nothing." Patient states they can use these personal strengths during their treatment to contribute to their recovery: None reported Patient states these barriers may affect/interfere with their treatment: None reported  Patient states these barriers may affect their return to the community: None reported Other important information patient would like considered in planning for their treatment: He stated that life is too difficult with the drinking and the tremors. He cannot write and he is unable to read.  Discharge Plan:   Currently receiving community mental health services: Yes (From Whom)([facility]) Patient states concerns and preferences for aftercare planning are: Pt reported [facility] Patient states they will know when they are safe and ready for discharge when: [place] stated that he will feel better when he is ready to discharge Does patient have access to transportation?: Yes(Mom) Does patient have financial barriers related to discharge medications?:  No(Medicaid) Patient description of barriers related to discharge medications: None reported Will patient be returning to same living situation after discharge?: Yes  Summary/Recommendations:   Summary and Recommendations (to be completed by the evaluator): Lief Palmatier is a 42 yo [place] male who is diagnosed with Schizophrenia. He presents voluntarily and has been experiencing depression, suicide ideation due to withdrawal from alcohol. While here [place] can benefit from crises stabilization, medication management and a therapeutic milieu.  [employer]. 12/10/2017

## 2017-12-10 NOTE — Progress Notes (Signed)
D: Pt denies SI/HI/AVH. Pt is pleasant and cooperative. Pt stated he was doing fine  A: Pt was offered support and encouragement. Pt was given scheduled medications. Pt was encourage to attend groups. Q 15 minute checks were done for safety.  R:Pt attends groups and interacts well with peers and staff. Pt is taking medication. Pt has no complaints.Pt receptive to treatment and safety maintained on unit.  Problem: Education: Goal: Emotional status will improve Outcome: Progressing   Problem: Education: Goal: Mental status will improve Outcome: Progressing   Problem: Medication: Goal: Compliance with prescribed medication regimen will improve Outcome: Progressing   Problem: Coping: Goal: Coping ability will improve Outcome: Progressing

## 2017-12-10 NOTE — Progress Notes (Signed)
DAR Note: Pt A & O to self, place and events leading to admission. Denies SI, HI, AVH and pain when assessed. Observed pacing in hall, presents very fidgety / tremulous in bilateral hands "I'm alright except for the shaking in my hands". Pt did not attend groups this shift despite multiple prompts. Reports anxiety & tremors with withdrawals. Took his medications when offered, denies side effects thus far. Emotional support offered to pt throughout this shift. Scheduled medications given as ordered with verbal education and effects monitored. Encouraged pt to voice concerns, attend to ADLs and comply with treatment regimen including groups as scheduled.  Q 15 minutes safety checks maintained without self harm gestures or outburst. Pt tolerates all PO intake well. Denies concerns. Remains safe on and off unit.

## 2017-12-10 NOTE — Tx Team (Signed)
Interdisciplinary Treatment and Diagnostic Plan Update  12/10/2017 Time of Session: 10:54 AM Leonard Guerrero MRN: [id number]  Principal Diagnosis: <principal problem not specified>  Secondary Diagnoses: Active Problems:   Alcohol dependence with uncomplicated withdrawal (HCC)   Schizophrenia (HCC)   Current Medications:  Current Facility-Administered Medications  Medication Dose Route Frequency Provider Last Rate Last Dose   acetaminophen (TYLENOL) tablet 650 mg  650 mg Oral Q6H PRN Starkes-Perry, Gayland Curry, FNP       alum & mag hydroxide-simeth (MAALOX/MYLANTA) 200-200-20 MG/5ML suspension 30 mL  30 mL Oral Q4H PRN Starkes-Perry, Gayland Curry, FNP       dextromethorphan-guaiFENesin (MUCINEX DM) 30-600 MG per 12 hr tablet 1 tablet  1 tablet Oral BID Johnn Hai, MD       diazepam (VALIUM) tablet 10 mg  10 mg Oral TID Johnn Hai, MD       escitalopram (LEXAPRO) tablet 15 mg  15 mg Oral Daily Suella Broad, FNP   15 mg at 12/10/17 0758   gabapentin (NEURONTIN) capsule 300 mg  300 mg Oral TID Johnn Hai, MD       hydrOXYzine (ATARAX/VISTARIL) tablet 25 mg  25 mg Oral Q6H PRN Lindell Spar I, NP       loperamide (IMODIUM) capsule 2-4 mg  2-4 mg Oral PRN Lindell Spar I, NP       LORazepam (ATIVAN) tablet 1 mg  1 mg Oral Q6H PRN Nwoko, Agnes I, NP       magnesium hydroxide (MILK OF MAGNESIA) suspension 30 mL  30 mL Oral Daily PRN Starkes-Perry, Gayland Curry, FNP       multivitamin with minerals tablet 1 tablet  1 tablet Oral Daily Lindell Spar I, NP   1 tablet at 12/10/17 3785   nicotine (NICODERM CQ - dosed in mg/24 hours) patch 21 mg  21 mg Transdermal Q0600 Suella Broad, FNP   21 mg at 12/10/17 0757   ondansetron (ZOFRAN-ODT) disintegrating tablet 4 mg  4 mg Oral Q6H PRN Encarnacion Slates, NP       [START ON 12/11/2017] thiamine (VITAMIN B-1) tablet 100 mg  100 mg Oral Daily Nwoko, Herbert Pun I, NP       PTA Medications: Medications Prior to Admission  Medication Sig  Dispense Refill Last Dose   escitalopram (LEXAPRO) 5 MG tablet Take 3 tablets (15 mg total) by mouth daily. (Patient not taking: Reported on 12/09/2017) 90 tablet 0 Not Taking at Unknown time   gabapentin (NEURONTIN) 100 MG capsule Take 2 capsules (200 mg total) by mouth 2 (two) times daily. 120 capsule 0 Past Week at Unknown time   hydrOXYzine (ATARAX/VISTARIL) 25 MG tablet Take 1 tablet (25 mg total) by mouth every 6 (six) hours as needed for anxiety. 30 tablet 0 Past Month at Unknown time   nicotine (NICODERM CQ - DOSED IN MG/24 HOURS) 21 mg/24hr patch Place 1 patch (21 mg total) onto the skin daily at 6 (six) AM. (Patient not taking: Reported on 12/09/2017) 28 patch 0 Not Taking at Unknown time    Patient Stressors: Health problems Substance abuse  Patient Strengths: Capable of independent living Supportive family/friends  Treatment Modalities: Medication Management, Group therapy, Case management,  1 to 1 session with clinician, Psychoeducation, Recreational therapy.   Physician Treatment Plan for Primary Diagnosis: <principal problem not specified> Long Term Goal(s): Improvement in symptoms so as ready for discharge Improvement in symptoms so as ready for discharge   Short Term Goals: Ability to identify changes in  lifestyle to reduce recurrence of condition will improve Ability to identify changes in lifestyle to reduce recurrence of condition will improve  Medication Management: Evaluate patient's response, side effects, and tolerance of medication regimen.  Therapeutic Interventions: 1 to 1 sessions, Unit Group sessions and Medication administration.  Evaluation of Outcomes: Not Met  Physician Treatment Plan for Secondary Diagnosis: Active Problems:   Alcohol dependence with uncomplicated withdrawal ([place])   Schizophrenia ([place])  Long Term Goal(s): Improvement in symptoms so as ready for discharge Improvement in symptoms so as ready for discharge   Short Term Goals:  Ability to identify changes in lifestyle to reduce recurrence of condition will improve Ability to identify changes in lifestyle to reduce recurrence of condition will improve     Medication Management: Evaluate patient's response, side effects, and tolerance of medication regimen.  Therapeutic Interventions: 1 to 1 sessions, Unit Group sessions and Medication administration.  Evaluation of Outcomes: Not Met   RN Treatment Plan for Primary Diagnosis: <principal problem not specified> Long Term Goal(s): Knowledge of disease and therapeutic regimen to maintain health will improve  Short Term Goals: Ability to identify and develop effective coping behaviors will improve and Compliance with prescribed medications will improve  Medication Management: RN will administer medications as ordered by provider, will assess and evaluate patient's response and provide education to patient for prescribed medication. RN will report any adverse and/or side effects to prescribing provider.  Therapeutic Interventions: 1 on 1 counseling sessions, Psychoeducation, Medication administration, Evaluate responses to treatment, Monitor vital signs and CBGs as ordered, Perform/monitor CIWA, COWS, AIMS and Fall Risk screenings as ordered, Perform wound care treatments as ordered.  Evaluation of Outcomes: Not Met   LCSW Treatment Plan for Primary Diagnosis: <principal problem not specified> Long Term Goal(s): Safe transition to appropriate next level of care at discharge, Engage patient in therapeutic group addressing interpersonal concerns.  Short Term Goals: Engage patient in aftercare planning with referrals and resources, Increase social support and Increase skills for wellness and recovery  Therapeutic Interventions: Assess for all discharge needs, 1 to 1 time with Social worker, Explore available resources and support systems, Assess for adequacy in community support network, Educate family and significant  other(s) on suicide prevention, Complete Psychosocial Assessment, Interpersonal group therapy.  Evaluation of Outcomes: Not Met   Progress in Treatment: Attending groups: No. Participating in groups: No. Taking medication as prescribed: Yes. Toleration medication: Yes. Family/Significant other contact made: No, will contact:  Mother Patient understands diagnosis: Yes. Discussing patient identified problems/goals with staff: Yes. Medical problems stabilized or resolved: No. Denies suicidal/homicidal ideation: Yes. Issues/concerns per patient self-inventory: No. Other: None  New problem(s) identified: No, Describe:  None  New Short Term/Long Term Goal(s): "Get clean"  Patient Goals:    Discharge Plan or Barriers:   Reason for Continuation of Hospitalization: Depression Medication stabilization Withdrawal symptoms  Estimated Length of Stay: 3 to 5 days  Attendees:  Patient: Leonard Guerrero 12/10/2017 12:53 PM  Physician: Dr. Jake Samples, MD 12/10/2017 12:53 PM  Nursing: Keane Police, RN 12/10/2017 12:53 PM  RN Care Manager:  12/10/2017 12:53 PM  Social Worker: Lurline Idol, LCSW 12/10/2017 12:53 PM  Recreational Therapist:  12/10/2017 12:53 PM  Other: Lawana Pai, MSW Intern 12/10/2017 12:53 PM  Other:  12/10/2017 12:53 PM  Other: 12/10/2017 12:53 PM    Scribe for Treatment Team: Lawana Pai, MSW Intern 12/10/2017 12:53 PM

## 2017-12-10 NOTE — Progress Notes (Signed)
Recreation Therapy Notes  INPATIENT RECREATION THERAPY ASSESSMENT  Patient Details Name: Leonard Guerrero MRN: [id number] DOB: January 27, 1976 Today's Date: 12/10/2017       Information Obtained From: Patient  Able to Participate in Assessment/Interview: Yes  Patient Presentation: Alert  Reason for Admission (Per Patient): Suicide Attempt, Substance Abuse  Patient Stressors: Other (Comment)("Drinking")  Coping Skills:   Isolation, Self-Injury, Music, Substance Abuse, Impulsivity, Talk, Prayer, Avoidance  Leisure Interests (2+):  Nature - Ambulance person of Recreation/Participation: Pharmacist, community Resources:  No  Expressed Interest in [employer] Information: No  [employer] of Residence:  [place]  Patient Main Form of Transportation: Other (Comment)(Mom)  Patient Strengths:  "I don't know, that's a hard question"  Patient Identified Areas of Improvement:  Quit drinking; Take medications"  Patient Goal for Hospitalization:  "Quit drinking, detox"  Current SI (including self-harm):  No  Current HI:  No  Current AVH: No  Staff Intervention Plan: Group Attendance, Collaborate with Interdisciplinary Treatment Team  Consent to Intern Participation: N/A   Caroll Rancher, LRT/CTRS  Lillia Abed, Hawk Mones A 12/10/2017, 1:16 PM

## 2017-12-10 NOTE — [facility] Suicide Risk Assessment (Signed)
[facility] INPATIENT:  Family/Significant Other Suicide Prevention Education  Suicide Prevention Education:  Education Completed; Mother Marthenia Rolling [phone],  has been identified by the patient as the family member/significant other with whom the patient will be residing, and identified as the person(s) who will aid the patient in the event of a mental health crisis (suicidal ideations/suicide attempt).  With written consent from the patient, the family member/significant other has been provided the following suicide prevention education, prior to the and/or following the discharge of the patient.  The suicide prevention education provided includes the following:  Suicide risk factors  Suicide prevention and interventions  National Suicide Hotline telephone number  [facility] assessment telephone number  [facility] Emergency Assistance 911  [facility] and/or Residential Mobile Crisis Unit telephone number  Request made of family/significant other to:  Remove weapons (e.g., guns, rifles, knives), all items previously/currently identified as safety concern.  Idell Pickles stated that there are no guns in the house.  Remove drugs/medications (over-the-counter, prescriptions, illicit drugs), all items previously/currently identified as a safety concern.    The family member/significant other verbalizes understanding of the suicide prevention education information provided.  The family member/significant other agrees to remove the items of safety concern listed above.  Cherie Bohaboy 12/10/2017, 4:38 PM

## 2017-12-11 MED ORDER — DIAZEPAM 5 MG PO TABS: 5.0000 mg | ORAL_TABLET | Freq: Three times a day (TID) | ORAL | Status: AC

## 2017-12-11 MED ORDER — METOPROLOL SUCCINATE ER 50 MG PO TB24
50.0000 mg | ORAL_TABLET | Freq: Every day | ORAL | Status: DC
  Administered 2017-12-11 – 2017-12-12 (×2): 50 mg via ORAL
  Filled 2017-12-11 (×3): qty 1

## 2017-12-11 NOTE — [facility] Group Notes (Signed)
Adult Psychoeducational Group Note  Date:  12/11/2017 Time:  8:59 AM  Group Topic/Focus:  Goals Group:   The focus of this group is to help patients establish daily goals to achieve during treatment and discuss how the patient can incorporate goal setting into their daily lives to aide in recovery.  Participation Level:  Did Not Attend  Participation Quality:    Affect:    Cognitive:    Insight:   Engagement in Group:    Modes of Intervention:    Additional Comments:    Donell Beers 12/11/2017, 8:59 AM

## 2017-12-11 NOTE — Progress Notes (Signed)
Recreation Therapy Notes  Date: 11.7.19 Time: 1000 Location: 500 Hall Dayroom  Group Topic: Triggers  Goal Area(s) Addresses:  Patient will identify triggers. Patient will identify how to avoid triggers. Patient will identify how to face triggers head on.  Intervention: Worksheet  Activity: Triggers.  Patients were to identify their biggest triggers.  Patients were to then identify how they could avoid their triggers and how they could face their triggers head on when they could not be avoided.  Education: Triggers, Discharge Planning  Education Outcome: Acknowledges understanding/In group clarification offered/Needs additional education.   Clinical Observations/Feedback:  Pt did not attend group.     Caroll Rancher, LRT/CTRS         Caroll Rancher A 12/11/2017 11:21 AM

## 2017-12-11 NOTE — [facility] Group Notes (Signed)
  [facility] LCSW Group Therapy Note  Date/Time: 12/11/17, 1315  Type of Therapy/Topic:  Group Therapy:  Emotion Regulation  Participation Level:  Did Not Attend   Mood:  Description of Group:    The purpose of this group is to assist patients in learning to regulate negative emotions and experience positive emotions. Patients will be guided to discuss ways in which they have been vulnerable to their negative emotions. These vulnerabilities will be juxtaposed with experiences of positive emotions or situations, and patients challenged to use positive emotions to combat negative ones. Special emphasis will be placed on coping with negative emotions in conflict situations, and patients will process healthy conflict resolution skills.  Therapeutic Goals: 1. Patient will identify two positive emotions or experiences to reflect on in order to balance out negative emotions:  2. Patient will label two or more emotions that they find the most difficult to experience:  3. Patient will be able to demonstrate positive conflict resolution skills through discussion or role plays:   Summary of Patient Progress:       Therapeutic Modalities:   Cognitive Behavioral Therapy Feelings Identification Dialectical Behavioral Therapy  Daleen Squibb, LCSW

## 2017-12-11 NOTE — Progress Notes (Signed)
D: Pt denies SI/HI/AVH. Pt is pleasant and cooperative. Pt visible pacing the unit  To relieve anxiety. Pt stated he was feeling better due to the medications.  A: Pt was offered support and encouragement. Pt was given scheduled medications. Pt was encourage to attend groups. Q 15 minute checks were done for safety.  R:Pt attends groups and interacts well with peers and staff. Pt is taking medication. Pt has no complaints.Pt receptive to treatment and safety maintained on unit.  Problem: Education: Goal: Emotional status will improve Outcome: Progressing   Problem: Education: Goal: Mental status will improve Outcome: Progressing   Problem: Medication: Goal: Compliance with prescribed medication regimen will improve Outcome: Progressing

## 2017-12-11 NOTE — Progress Notes (Signed)
[facility] MD Progress Note  12/11/2017 8:56 AM Leonard Guerrero  MRN:  [id number] Subjective:  Pt reports no cravings - no withdrawal tremors yet still has TD of hands- mouth area No si  Mood stable No acute psychosis BP up - Principal Problem: ETOH dep/psychosis/ TD/HTN independent of withdrawal Diagnosis:   Patient Active Problem List   Diagnosis Date Noted  . Suicide attempt (HCC) [T14.91XA]   . Schizoaffective disorder, depressive type (HCC) [F25.1] 06/15/2017  . Schizophrenia (HCC) [F20.9] 10/23/2016  . MDD (major depressive disorder), recurrent, severe, with psychosis (HCC) [F33.3] 10/23/2016  . Elevated TSH [R79.89] 06/18/2016  . Paranoid schizophrenia (HCC) [F20.0] 06/17/2016  . Major depressive disorder with single episode [F32.9] 06/17/2016  . Alcohol dependence with uncomplicated withdrawal (HCC) [F10.230] 06/17/2016  . Cocaine use disorder, moderate, dependence (HCC) [F14.20] 06/17/2016  . Alcoholic intoxication without complication (HCC) [F10.920]   . Acute encephalopathy [G93.40] 06/08/2016  . Acute respiratory failure (HCC) [J96.00]   . Altered mental status [R41.82]   . Encephalopathy acute [G93.40] 06/07/2016  . Chest pain [R07.9] 04/04/2015   Total Time spent with patient: 20 minutes  Past Psychiatric History: no new info  Past Medical History:  Past Medical History:  Diagnosis Date  . Bipolar 1 disorder (HCC)   . ETOH abuse   . Hypertension   . Paranoid schizophrenia [facility])     Past Surgical History:  Procedure Laterality Date  . DENTAL SURGERY     complete extraction   Family History:  Family History  Problem Relation Age of Onset  . Mental illness Father   . Heart attack Father   . Hypertension Mother   . Mental illness Sister     Social History:  Social History   Substance and Sexual Activity  Alcohol Use Yes  . Alcohol/week: 3.0 - 4.0 standard drinks  . Types: 3 - 4 Cans of beer per week     Social History   Substance and Sexual Activity   Drug Use Not on file    Social History   Socioeconomic History  . Marital status: Single    Spouse name: Not on file  . Number of children: Not on file  . Years of education: Not on file  . Highest education level: Not on file  Occupational History  . Not on file  Social Needs  . Financial resource strain: Not on file  . Food insecurity:    Worry: Not on file    Inability: Not on file  . Transportation needs:    Medical: Not on file    Non-medical: Not on file  Tobacco Use  . Smoking status: Current Every Day Smoker    Packs/day: 1.00    Years: 10.00    Pack years: 10.00    Types: Cigarettes  . Smokeless tobacco: Never Used  Substance and Sexual Activity  . Alcohol use: Yes    Alcohol/week: 3.0 - 4.0 standard drinks    Types: 3 - 4 Cans of beer per week  . Drug use: Not on file  . Sexual activity: Not on file  Lifestyle  . Physical activity:    Days per week: Not on file    Minutes per session: Not on file  . Stress: Not on file  Relationships  . Social connections:    Talks on phone: Not on file    Gets together: Not on file    Attends religious service: Not on file    Active member of club or organization: Not  on file    Attends meetings of clubs or organizations: Not on file    Relationship status: Not on file  Other Topics Concern  . Not on file  Social History Narrative   ** Merged History Encounter **       Current Medications: Current Facility-Administered Medications  Medication Dose Route Frequency Provider Last Rate Last Dose  . acetaminophen (TYLENOL) tablet 650 mg  650 mg Oral Q6H PRN Maryagnes Amos, FNP      . alum & mag hydroxide-simeth (MAALOX/MYLANTA) 200-200-20 MG/5ML suspension 30 mL  30 mL Oral Q4H PRN Rosario Adie, Juel Burrow, FNP      . dextromethorphan-guaiFENesin (MUCINEX DM) 30-600 MG per 12 hr tablet 1 tablet  1 tablet Oral BID Malvin Johns, MD   1 tablet at 12/11/17 0748  . diazepam (VALIUM) tablet 5 mg  5 mg Oral TID Malvin Johns, MD      . escitalopram (LEXAPRO) tablet 15 mg  15 mg Oral Daily Maryagnes Amos, FNP   15 mg at 12/11/17 0748  . gabapentin (NEURONTIN) capsule 300 mg  300 mg Oral TID Malvin Johns, MD   300 mg at 12/11/17 0748  . hydrOXYzine (ATARAX/VISTARIL) tablet 25 mg  25 mg Oral Q6H PRN Armandina Stammer I, NP   25 mg at 12/10/17 2122  . loperamide (IMODIUM) capsule 2-4 mg  2-4 mg Oral PRN Nwoko, Agnes I, NP      . LORazepam (ATIVAN) tablet 1 mg  1 mg Oral Q6H PRN Nwoko, Agnes I, NP      . magnesium hydroxide (MILK OF MAGNESIA) suspension 30 mL  30 mL Oral Daily PRN Rosario Adie, Juel Burrow, FNP      . menthol-cetylpyridinium (CEPACOL) lozenge 3 mg  1 lozenge Oral PRN Kerry Hough, PA-C      . metoprolol succinate (TOPROL-XL) 24 hr tablet 50 mg  50 mg Oral Daily Malvin Johns, MD      . mirtazapine (REMERON SOL-TAB) disintegrating tablet 30 mg  30 mg Oral QHS Donell Sievert E, PA-C   30 mg at 12/10/17 2122  . multivitamin with minerals tablet 1 tablet  1 tablet Oral Daily Armandina Stammer I, NP   1 tablet at 12/11/17 0748  . nicotine (NICODERM CQ - dosed in mg/24 hours) patch 21 mg  21 mg Transdermal Q0600 Maryagnes Amos, FNP   21 mg at 12/11/17 0604  . ondansetron (ZOFRAN-ODT) disintegrating tablet 4 mg  4 mg Oral Q6H PRN Armandina Stammer I, NP      . thiamine (VITAMIN B-1) tablet 100 mg  100 mg Oral Daily Armandina Stammer I, NP   100 mg at 12/11/17 1610    Lab Results:  Results for orders placed or performed during the hospital encounter of 12/09/17 (from the past 48 hour(s))  Hemoglobin A1c     Status: None   Collection Time: 12/10/17  6:19 AM  Result Value Ref Range   Hgb A1c MFr Bld 4.9 4.8 - 5.6 %    Comment: (NOTE) Pre diabetes:          5.7%-6.4% Diabetes:              >6.4% Glycemic control for   <7.0% adults with diabetes    Mean Plasma Glucose 93.93 mg/dL    Comment: Performed at [facility], [address], [place], [place] [address]  Lipid panel     Status: Abnormal    Collection Time: 12/10/17  6:19 AM  Result  Value Ref Range   Cholesterol 230 (H) 0 - 200 mg/dL   Triglycerides 161 <096 mg/dL   HDL 72 >04 mg/dL   Total CHOL/HDL Ratio 3.2 RATIO   VLDL 23 0 - 40 mg/dL   LDL Cholesterol 540 (H) 0 - 99 mg/dL    Comment:        Total Cholesterol/HDL:CHD Risk Coronary Heart Disease Risk Table                     Men   Women  1/2 Average Risk   3.4   3.3  Average Risk       5.0   4.4  2 X Average Risk   9.6   7.1  3 X Average Risk  23.4   11.0        Use the calculated Patient Ratio above and the CHD Risk Table to determine the patient's CHD Risk.        ATP III CLASSIFICATION (LDL):  <100     mg/dL   Optimal  981-191  mg/dL   Near or Above                    Optimal  130-159  mg/dL   Borderline  478-295  mg/dL   High  >621     mg/dL   Very High Performed at [facility], [address], [place], [place] [address]   TSH     Status: Abnormal   Collection Time: 12/10/17  6:19 AM  Result Value Ref Range   TSH 5.081 (H) 0.350 - 4.500 uIU/mL    Comment: Performed by a 3rd Generation assay with a functional sensitivity of <=0.01 uIU/mL. Performed at [facility], [address] [place], [place] [address]     Blood Alcohol level:  Lab Results  Component Value Date   ETH <10 12/08/2017   ETH 230 (H) 06/15/2017    Metabolic Disorder Labs: Lab Results  Component Value Date   HGBA1C 4.9 12/10/2017   MPG 93.93 12/10/2017   MPG 100 06/18/2016   Lab Results  Component Value Date   PROLACTIN 28.5 (H) 06/18/2016   Lab Results  Component Value Date   CHOL 230 (H) 12/10/2017   TRIG 115 12/10/2017   HDL 72 12/10/2017   CHOLHDL 3.2 12/10/2017   VLDL 23 12/10/2017   LDLCALC 135 (H) 12/10/2017   LDLCALC 142 (H) 06/18/2016    Physical Findings: AIMS: Facial and Oral Movements Muscles of Facial Expression: None, normal Lips and Perioral Area: None, normal Jaw: None, normal Tongue: None, normal,Extremity  Movements Upper (arms, wrists, hands, fingers): Moderate Lower (legs, knees, ankles, toes): None, normal, Trunk Movements Neck, shoulders, hips: None, normal, Overall Severity Severity of abnormal movements (highest score from questions above): None, normal Incapacitation due to abnormal movements: None, normal Patient's awareness of abnormal movements (rate only patient's report): No Awareness, Dental Status Current problems with teeth and/or dentures?: No Does patient usually wear dentures?: No  CIWA:  CIWA-Ar Total: 4 COWS:  COWS Total Score: 4  Musculoskeletal: Strength & Muscle Tone: within normal limits Gait & Station: normal Patient leans: N/A  Psychiatric Specialty Exam: Physical Exam  ROS  Blood pressure 122/90, pulse 90, temperature 98.4 F (36.9 C), resp. rate 20, height 6' (1.829 m), weight 83.9 kg, SpO2 98 %.Body mass index is 25.09 kg/m.  General Appearance: Disheveled  Eye Contact:  Good  Speech:  Clear and Coherent  Volume:  Normal  Mood:  Euthymic  Affect:  Appropriate  Thought Process:  Goal dir  Orientation:  Full (Time, Place, and Person)  Thought Content:  Logical  Suicidal Thoughts:  No  Homicidal Thoughts:  No  Memory:  Immediate;   Fair  Judgement:  Fair  Insight:  Fair  Psychomotor Activity:  TD  Concentration:  Concentration: Fair  Recall:  [employer] of Knowledge:  Fair  Language:  Good  Akathisia:  Yes  Handed:  Right  AIMS (if indicated):     Assets:  Communication Skills Desire for Improvement  ADL's:  Intact  Cognition:  WNL  Sleep:  Number of Hours: 5.5     Treatment Plan Summary: Daily contact with patient to assess and evaluate symptoms and progress in treatment and Medication management Add toprol Taper valium May go in am  [facility], MD 12/11/2017, 8:56 AM

## 2017-12-11 NOTE — Progress Notes (Addendum)
CSW spoke with pt regarding discharge plans.  Pt reports he has been going to [facility] for 10 years, wants to continue.  Pt reports that he does not think he needs any treatment for his alcohol use and has already talked to his mother about "getting the beer out of the house."  CSW engaged client in discussion about what he needs to do to stay sober.  Pt may be interested in treatment at some point.  His pastor has been trying to get him to go to a residential program and he was at [facility] several years ago.  He has pending charges currently with a court date of 11/21.  He is also planning to go to [place] over Thanksgiving and may stay there until Christmas with family.  We discussed that [facility]er can help with substance abuse treatment when he returns from [place].  CSW will also provide list of resources in the area. Garner Nash, MSW, LCSW Clinical Social Worker 12/11/2017 10:29 AM   CSW spoke to Nicole Cella at [facility].  Pt has existing appt on 11/19 for injection and they would like to keep that time. Garner Nash, MSW, LCSW Clinical Social Worker 12/11/2017 10:46 AM

## 2017-12-12 MED ORDER — ESCITALOPRAM OXALATE 5 MG PO TABS: 15.0000 mg | ORAL_TABLET | Freq: Every day | ORAL | 0 refills | Status: DC

## 2017-12-12 MED ORDER — HYDROXYZINE HCL 25 MG PO TABS: 25.0000 mg | ORAL_TABLET | Freq: Four times a day (QID) | ORAL | 0 refills | Status: DC | PRN

## 2017-12-12 MED ORDER — GABAPENTIN 300 MG PO CAPS: 300.0000 mg | ORAL_CAPSULE | Freq: Three times a day (TID) | ORAL | 0 refills | Status: DC

## 2017-12-12 MED ORDER — METOPROLOL SUCCINATE ER 100 MG PO TB24: 100.0000 mg | ORAL_TABLET | Freq: Every day | ORAL | 0 refills | Status: DC

## 2017-12-12 MED ORDER — NICOTINE 21 MG/24HR TD PT24: 21.0000 mg | MEDICATED_PATCH | Freq: Every day | TRANSDERMAL | 0 refills | Status: DC

## 2017-12-12 MED ORDER — METOPROLOL SUCCINATE ER 100 MG PO TB24
100.0000 mg | ORAL_TABLET | Freq: Every day | ORAL | Status: DC
  Filled 2017-12-12 (×2): qty 1

## 2017-12-12 MED ORDER — MIRTAZAPINE 30 MG PO TBDP: 30.0000 mg | ORAL_TABLET | Freq: Every day | ORAL | 0 refills | Status: DC

## 2017-12-12 NOTE — Discharge Summary (Signed)
Physician Discharge Summary Note  Patient:  Leonard Guerrero is an 42 y.o., male MRN:  [id number] DOB:  06-20-1975 Patient phone:  [phone] (home)  Patient address:   [address] [place] [address],   Total Time spent with patient: Greater than 30 minutes  Date of Admission:  12/09/2017  Date of Discharge: 12-12-17  Reason for Admission: Intentional drug overdose..  Principal Problem: Major depressive disorder with single episode  Discharge Diagnoses: Patient Active Problem List   Diagnosis Date Noted  . Major depressive disorder with single episode [F32.9] 06/17/2016    Priority: High  . Suicide attempt (HCC) [T14.91XA]   . Schizoaffective disorder, depressive type (HCC) [F25.1] 06/15/2017  . Schizophrenia (HCC) [F20.9] 10/23/2016  . MDD (major depressive disorder), recurrent, severe, with psychosis (HCC) [F33.3] 10/23/2016  . Elevated TSH [R79.89] 06/18/2016  . Paranoid schizophrenia (HCC) [F20.0] 06/17/2016  . Alcohol dependence with uncomplicated withdrawal (HCC) [F10.230] 06/17/2016  . Cocaine use disorder, moderate, dependence (HCC) [F14.20] 06/17/2016  . Alcoholic intoxication without complication (HCC) [F10.920]   . Acute encephalopathy [G93.40] 06/08/2016  . Acute respiratory failure (HCC) [J96.00]   . Altered mental status [R41.82]   . Encephalopathy acute [G93.40] 06/07/2016  . Chest pain [R07.9] 04/04/2015   Past Psychiatric History: Schizophrenia  Past Medical History:  Past Medical History:  Diagnosis Date  . Bipolar 1 disorder (HCC)   . ETOH abuse   . Hypertension   . Paranoid schizophrenia [facility])     Past Surgical History:  Procedure Laterality Date  . DENTAL SURGERY     complete extraction   Family History:  Family History  Problem Relation Age of Onset  . Mental illness Father   . Heart attack Father   . Hypertension Mother   . Mental illness Sister    Family Psychiatric  History: See H&P  Social History:  Social History   Substance  and Sexual Activity  Alcohol Use Yes  . Alcohol/week: 3.0 - 4.0 standard drinks  . Types: 3 - 4 Cans of beer per week     Social History   Substance and Sexual Activity  Drug Use Not on file    Social History   Socioeconomic History  . Marital status: Single    Spouse name: Not on file  . Number of children: Not on file  . Years of education: Not on file  . Highest education level: Not on file  Occupational History  . Not on file  Social Needs  . Financial resource strain: Not on file  . Food insecurity:    Worry: Not on file    Inability: Not on file  . Transportation needs:    Medical: Not on file    Non-medical: Not on file  Tobacco Use  . Smoking status: Current Every Day Smoker    Packs/day: 1.00    Years: 10.00    Pack years: 10.00    Types: Cigarettes  . Smokeless tobacco: Never Used  Substance and Sexual Activity  . Alcohol use: Yes    Alcohol/week: 3.0 - 4.0 standard drinks    Types: 3 - 4 Cans of beer per week  . Drug use: Not on file  . Sexual activity: Not on file  Lifestyle  . Physical activity:    Days per week: Not on file    Minutes per session: Not on file  . Stress: Not on file  Relationships  . Social connections:    Talks on phone: Not on file  Gets together: Not on file    Attends religious service: Not on file    Active member of club or organization: Not on file    Attends meetings of clubs or organizations: Not on file    Relationship status: Not on file  Other Topics Concern  . Not on file  Social History Narrative   ** Merged History [facility] Course: (Per Md's admission evaluation): Pt is a reliable historian, presented seeking detox from "15 beers a day" at least- client at [facility] states he receives monthly Invega LAI shots for "schizophrenia" Reports no h/o seizures/moderate tremors in exam/2 DWI's in past/no black-outs. No acute SI or psychosis- yet states he took overdose 11/4 Acc to intake: By Dr  Freida Busman: "42 year old male presents after intentional ingestion yesterday with plan for suicide. Patient has history of multiple suicide attempt before in the past. Has a history of paranoid schizophrenia as well as bipolar disorder. He is reported to have taken 22 Ingrezza and 17 trazodone tablets approximately 13 hours ago. He would not disclose why he did this. Denies any auditory hallucinations. Patient also drinks 15 beers a day and his last drink was over 16 hours ago. He has had some dry heaves which is been nonbilious or bloody. Has also had some sweating"  After the above admission evaluation, Leonard Guerrero was started on the medication regimen targeting his presenting symptoms. He was medicated & discharged on; Lexapro 15 mg for depression, Gabapentin 300 mg for agitation, Vistaril 25 mg prn for anxiety & Mirtazapine 30 mg for depression He also received other medication  for the other medical issues presented. He tolerated his treatment regimen without any adverse effects or reatctions reported. He was also enrolled & participated in the group sessions being offered & held on this unit. He learned coping skills. He also received a brief detoxification treatments for the alcohol intoxication.  Leonard Guerrero is seen today by the attending psychiatrist for discharge. Pleased that he was made to sought help. Says he is tolerating his medications well. No withdrawal symptoms. No craving for alcohol. He is no longer feeling depressed. Describes normal energy and ability to think. Able to focus on task. No suicidal thoughts. No homicidal thoughts. No thoughts of violence. Leonard Guerrero reports normal biological functions.   The nursing staff reports that patient has been appropriate on the unit. Patient has been interacting well with peers & staff. No behavioral issues. Patient has not voiced any suicidal thoughts. Patient has not been observed to be internally stimulated or occupied. Patient has been adherent with  treatment recommendations. Patient has been tolerating his  medication well, denies any adverse reactions or side effects.   Patient was discussed at team meeting this morning. Team members feel that patient is back to his baseline level of function. Team agrees with plan to discharge patient today to continue mental health care on an outpatient basis as noted below. Upon discharge, Swan adamantly denies any SIHI, AVH, delusional thoughts, paranoia or substance withdrawal symptoms. He will continue further psychiatric follow-up care/medication management on an outpatient basis as noted below. He was provided with all the necessary information needed to make this appointment without problems. He left [facility] with all personal belongings in no apparent distress.  Physical Findings: AIMS: Facial and Oral Movements Muscles of Facial Expression: None, normal Lips and Perioral Area: None, normal Jaw: None, normal Tongue: None, normal,Extremity Movements Upper (arms, wrists, hands, fingers): Moderate Lower (legs, knees, ankles, toes): None,  normal, Trunk Movements Neck, shoulders, hips: None, normal, Overall Severity Severity of abnormal movements (highest score from questions above): None, normal Incapacitation due to abnormal movements: None, normal Patient's awareness of abnormal movements (rate only patient's report): No Awareness, Dental Status Current problems with teeth and/or dentures?: No Does patient usually wear dentures?: No  CIWA:  CIWA-Ar Total: 2 COWS:  COWS Total Score: 4  Musculoskeletal: Strength & Muscle Tone: within normal limits Gait & Station: normal Patient leans: N/A  Psychiatric Specialty Exam: Physical Exam  Nursing note and vitals reviewed. Constitutional: He appears well-developed.  HENT:  Head: Normocephalic.  Eyes: Pupils are equal, round, and reactive to light.  Neck: Normal range of motion.  Cardiovascular: Normal rate.  Respiratory: Effort normal.  GI:  Soft.  Genitourinary:  Genitourinary Comments: Deferred  Musculoskeletal: Normal range of motion.  Neurological: He is alert.  Skin: Skin is warm.    Review of Systems  Constitutional: Negative.   HENT: Negative.   Eyes: Negative.   Respiratory: Negative.   Cardiovascular: Negative.   Gastrointestinal: Negative.   Genitourinary: Negative.   Musculoskeletal: Negative.   Skin: Negative.   Neurological: Negative.   Endo/Heme/Allergies: Negative.   Psychiatric/Behavioral: Positive for depression (Stable) and substance abuse (Hx. alcohol/cocaine use disorder ). Negative for hallucinations, memory loss and suicidal ideas. The patient has insomnia (Stable). The patient is not nervous/anxious.     Blood pressure (!) 150/108, pulse 79, temperature 98 F (36.7 C), temperature source Oral, resp. rate 18, height 6' (1.829 m), weight 83.9 kg, SpO2 98 %.Body mass index is 25.09 kg/m.  See Md's SRA   Have you used any form of tobacco in the last 30 days? (Cigarettes, Smokeless Tobacco, Cigars, and/or Pipes): Yes  Has this patient used any form of tobacco in the last 30 days? (Cigarettes, Smokeless Tobacco, Cigars, and/or Pipes): Yes, provided with nicorette gum prescription upon discharge.  Blood Alcohol level:  Lab Results  Component Value Date   ETH <10 12/08/2017   ETH 230 (H) 06/15/2017    Metabolic Disorder Labs:  Lab Results  Component Value Date   HGBA1C 4.9 12/10/2017   MPG 93.93 12/10/2017   MPG 100 06/18/2016   Lab Results  Component Value Date   PROLACTIN 28.5 (H) 06/18/2016   Lab Results  Component Value Date   CHOL 230 (H) 12/10/2017   TRIG 115 12/10/2017   HDL 72 12/10/2017   CHOLHDL 3.2 12/10/2017   VLDL 23 12/10/2017   LDLCALC 135 (H) 12/10/2017   LDLCALC 142 (H) 06/18/2016   See Psychiatric Specialty Exam and Suicide Risk Assessment completed by Attending Physician prior to discharge.  Discharge destination:  Home  Is patient on multiple antipsychotic  therapies at discharge:  No   Has Patient had three or more failed trials of antipsychotic monotherapy by history:  No  Recommended Plan for Multiple Antipsychotic Therapies: NA  Allergies as of 12/12/2017   No Known Allergies     Medication List    TAKE these medications     Indication  escitalopram 5 MG tablet Commonly known as:  LEXAPRO Take 3 tablets (15 mg total) by mouth daily. For depression What changed:  additional instructions  Indication:  Major Depressive Disorder   gabapentin 300 MG capsule Commonly known as:  NEURONTIN Take 1 capsule (300 mg total) by mouth 3 (three) times daily. For agitation What changed:    medication strength  how much to take  when to take this  additional instructions  Indication:  Agitation   hydrOXYzine 25 MG tablet Commonly known as:  ATARAX/VISTARIL Take 1 tablet (25 mg total) by mouth every 6 (six) hours as needed for anxiety.  Indication:  Feeling Anxious   metoprolol succinate 100 MG 24 hr tablet Commonly known as:  TOPROL-XL Take 1 tablet (100 mg total) by mouth daily. Take with or immediately following a meal: For high blood pressure Start taking on:  12/13/2017  Indication:  High Blood Pressure Disorder   mirtazapine 30 MG disintegrating tablet Commonly known as:  REMERON SOL-TAB Take 1 tablet (30 mg total) by mouth at bedtime. For depression/insomnia  Indication:  Major Depressive Disorder   nicotine 21 mg/24hr patch Commonly known as:  NICODERM CQ - dosed in mg/24 hours Place 1 patch (21 mg total) onto the skin daily at 6 (six) AM. (May purchase from over the counter): For smoking cessation What changed:  additional instructions  Indication:  Nicotine Addiction      Follow-up Information    [facility]. Go on 12/23/2017.   Specialty:  Behavioral Health Why:  Please attend your medication appt on Tuesday, 12/23/17, at 8:40am.   Contact information: [address] [place] [address] [phone]           Follow-up recommendations: Activity:  As tolerated Diet: As recommended by your primary care doctor. Keep all scheduled follow-up appointments as recommended.   Comments: Patient is instructed prior to discharge to: Take all medications as prescribed by his/her mental healthcare provider. Report any adverse effects and or reactions from the medicines to his/her outpatient provider promptly. Patient has been instructed & cautioned: To not engage in alcohol and or illegal drug use while on prescription medicines. In the event of worsening symptoms, patient is instructed to call the crisis hotline, 911 and or go to the nearest ED for appropriate evaluation and treatment of symptoms. To follow-up with his/her primary care provider for your other medical issues, concerns and or health care needs.   Signed: Armandina Stammer, NP, PMHNP, FNP-BC 12/12/2017, 1:39 PM

## 2017-12-12 NOTE — Plan of Care (Signed)
Pt did not attend recreational therapy group sessions.   Divinity Kyler, LRT/CTRS 

## 2017-12-12 NOTE — Progress Notes (Signed)
  [facility] Adult Case Management Discharge Plan :  Will you be returning to the same living situation after discharge:  Yes,  patient reports he is returning home with his mother  At discharge, do you have transportation home?: Yes,  patient reports his mother is picking him up at discharge Do you have the ability to pay for your medications: No.  Release of information consent forms completed and in the chart;  Patient's signature needed at discharge.  Patient to Follow up at: Follow-up Information    [facility]. Go on 12/23/2017.   Specialty:  Behavioral Health Why:  Please attend your medication appt on Tuesday, 12/23/17, at 8:40am.   Contact information: [address] [place] [address] [phone]           Next level of care provider has access to [facility] Link:yes  Safety Planning and Suicide Prevention discussed: Yes,  with the patient's mother   Have you used any form of tobacco in the last 30 days? (Cigarettes, Smokeless Tobacco, Cigars, and/or Pipes): Yes  Has patient been referred to the Quitline?: Patient refused referral  Patient has been referred for addiction treatment: N/A  Maeola Sarah, LCSWA 12/12/2017, 11:10 AM

## 2017-12-12 NOTE — Progress Notes (Signed)
Recreation Therapy Notes  Date: 11.8.19 Time: 1000 Location: 500 [address] Dayroom  Group Topic:  Music Therapy  Goal Area(s) Addresses:  Patient will identify ways in which music is beneficial. Patient will identify different emotions music can bring out.  Intervention: Music  Activity: Music Therapy.  LRT played music for patients to relax and enjoy some soothing tunes.  Patients could make requests of songs they wanted to hear.  Education: Communication, Discharge Planning  Education Outcome: Acknowledges understanding/In group clarification offered/Needs additional education.   Clinical Observations/Feedback: Pt did not attend group.    Caroll Rancher, LRT/CTRS        Caroll Rancher A 12/12/2017 12:11 PM

## 2017-12-12 NOTE — BHH Suicide Risk Assessment (Signed)
[facility] Discharge Suicide Risk Assessment   Principal Problem: overdosed to seek admission for detox Discharge Diagnoses:  Patient Active Problem List   Diagnosis Date Noted  . Suicide attempt (HCC) [T14.91XA]   . Schizoaffective disorder, depressive type (HCC) [F25.1] 06/15/2017  . Schizophrenia (HCC) [F20.9] 10/23/2016  . MDD (major depressive disorder), recurrent, severe, with psychosis (HCC) [F33.3] 10/23/2016  . Elevated TSH [R79.89] 06/18/2016  . Paranoid schizophrenia (HCC) [F20.0] 06/17/2016  . Major depressive disorder with single episode [F32.9] 06/17/2016  . Alcohol dependence with uncomplicated withdrawal (HCC) [F10.230] 06/17/2016  . Cocaine use disorder, moderate, dependence (HCC) [F14.20] 06/17/2016  . Alcoholic intoxication without complication (HCC) [F10.920]   . Acute encephalopathy [G93.40] 06/08/2016  . Acute respiratory failure (HCC) [J96.00]   . Altered mental status [R41.82]   . Encephalopathy acute [G93.40] 06/07/2016  . Chest pain [R07.9] 04/04/2015    Total Time spent with patient: 30 minutes  Musculoskeletal: Strength & Muscle Tone: within normal limits Gait & Station: normal Patient leans: N/A  Psychiatric Specialty Exam: ROS  Blood pressure (!) 150/108, pulse 79, temperature 98 F (36.7 C), temperature source Oral, resp. rate 18, height 6' (1.829 m), weight 83.9 kg, SpO2 98 %.Body mass index is 25.09 kg/m.  General Appearance: Casual and Disheveled  Eye Contact::  Good  Speech:  Clear and Coherent409  Volume:  Decreased  Mood:  stable  Affect:  Constricted  Thought Process:  Coherent  Orientation:  Full (Time, Place, and Person)  Thought Content:  Logical  Suicidal Thoughts:  No  Homicidal Thoughts:  No  Memory:  Immediate;   Good  Judgement:  Good  Insight:  Good  Psychomotor Activity:  Restlessness  Concentration:  Good  Recall:  Good  Fund of Knowledge:Good  Language: Good  Akathisia:  Yes  Handed:  Right  AIMS (if indicated):  16   Assets:  Communication Skills  Sleep:  Number of Hours: 4.75  Cognition: WNL  ADL's:  Intact   Mental Status Per Nursing Assessment::   On Admission:     Demographic Factors:  Male  Loss Factors: Decrease in vocational status  Historical Factors: Prior suicide attempts  Risk Reduction Factors:   Sense of responsibility to family and Religious beliefs about death  Continued Clinical Symptoms:  Schizophrenia:   Paranoid or undifferentiated type  Cognitive Features That Contribute To Risk:  None    Suicide Risk:  Mild:  Suicidal ideation of limited frequency, intensity, duration, and specificity.  There are no identifiable plans, no associated intent, mild dysphoria and related symptoms, good self-control (both objective and subjective assessment), few other risk factors, and identifiable protective factors, including available and accessible social support.  Follow-up Information    [facility]. Go on 12/23/2017.   Specialty:  Behavioral Health Why:  Please attend your medication appt on Tuesday, 12/23/17, at 8:40am.   Contact information: [address] [place] [address] [phone]          Stable for d-c - restart ingrezza Plan Of Care/Follow-up recommendations:  Activity:  full  Yareth Kearse, MD 12/12/2017, 7:45 AM

## 2017-12-12 NOTE — Progress Notes (Signed)
Recreation Therapy Notes  INPATIENT RECREATION TR PLAN  Patient Details Name: Leonard Guerrero MRN: [id number] DOB: Feb 07, 1975 Today's Date: 12/12/2017  Rec Therapy Plan Is patient appropriate for Therapeutic Recreation?: Yes Treatment times per week: about 3 days Estimated Length of Stay: 5-7 days TR Treatment/Interventions: Group participation (Comment)  Discharge Criteria Pt will be discharged from therapy if:: Discharged Treatment plan/goals/alternatives discussed and agreed upon by:: Patient/family  Discharge Summary Short term goals set: See patient care plan Short term goals met: Not met Reason goals not met: Pt did not attend groups Therapeutic equipment acquired: N/A Reason patient discharged from therapy: Discharge from hospital Pt/family agrees with progress & goals achieved: Yes Date patient discharged from therapy: 12/12/17      Victorino Sparrow, LRT/CTRS  Ria Comment, Jais Demir A 12/12/2017, 11:34 AM

## 2017-12-12 NOTE — Plan of Care (Signed)
Discharge Note  Patient verbalizes readiness for discharge. Follow up plan explained, AVS, Transition record and SRA given. Prescriptions and teaching provided. Belongings returned and signed for. Suicide safety plan completed and signed. Suicide prevention sheet provided. Patient verbalizes understanding. Patient denies SI/HI and assures this Clinical research associate he will seek assistance should that change. Patient discharged to lobby where mother was waiting.  Problem: Education: Goal: Knowledge of [facility] Education information/materials will improve Outcome: Adequate for Discharge Goal: Emotional status will improve Outcome: Adequate for Discharge Goal: Mental status will improve Outcome: Adequate for Discharge Goal: Verbalization of understanding the information provided will improve Outcome: Adequate for Discharge   Problem: Health Behavior/Discharge Planning: Goal: Identification of resources available to assist in meeting health care needs will improve Outcome: Adequate for Discharge   Problem: Medication: Goal: Compliance with prescribed medication regimen will improve Outcome: Adequate for Discharge   Problem: Coping: Goal: Coping ability will improve Outcome: Adequate for Discharge Goal: Will verbalize feelings Outcome: Adequate for Discharge   Problem: Self-Concept: Goal: Will verbalize positive feelings about self Outcome: Adequate for Discharge

## 2018-06-17 IMAGING — CT CT CERVICAL SPINE W/O CM
5 of 8 series · 12 of 33 positions shown, 13 images · non-contrast
Comparison: None.

CLINICAL DATA: Possible overdose

EXAM:
CT HEAD WITHOUT CONTRAST
CT CERVICAL SPINE WITHOUT CONTRAST
TECHNIQUE: Multidetector CT imaging of the head and cervical spine was
performed following the standard protocol without intravenous
contrast. Multiplanar CT image reconstructions of the cervical spine
were also generated.

[Series 4: head bone · axial · 0.48mm/px · z∈[+1031,+1085]mm · 2 of 83 slices shown]
[im 28/83  bone]
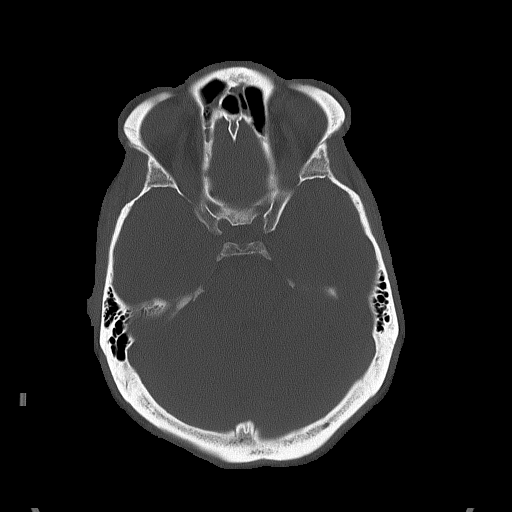
[im 55/83  bone]
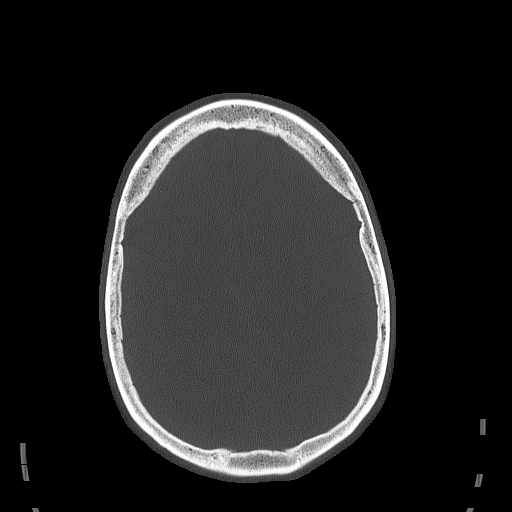

[Series 8: c_spine 2.0 st · axial · 0.27mm/px · z∈[+847,+955]mm · 3 of 109 slices shown, 4 images]
[im 28/109  soft-tissue]
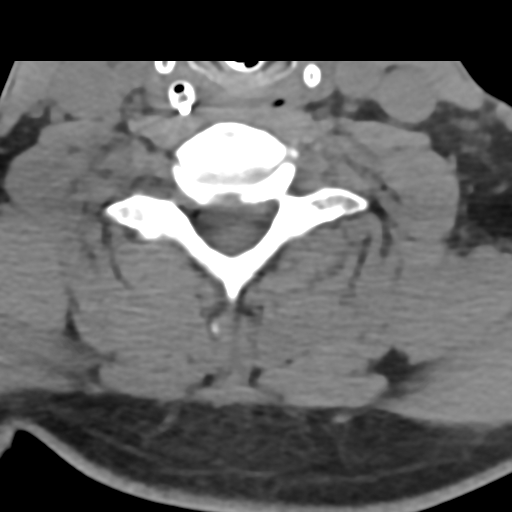
[im 28/109  bone]
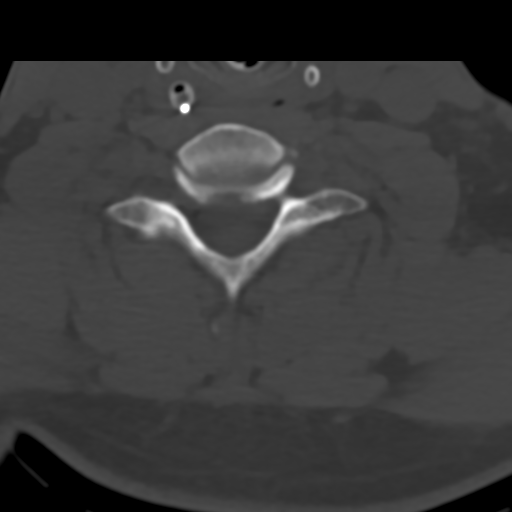
[im 55/109  bone]
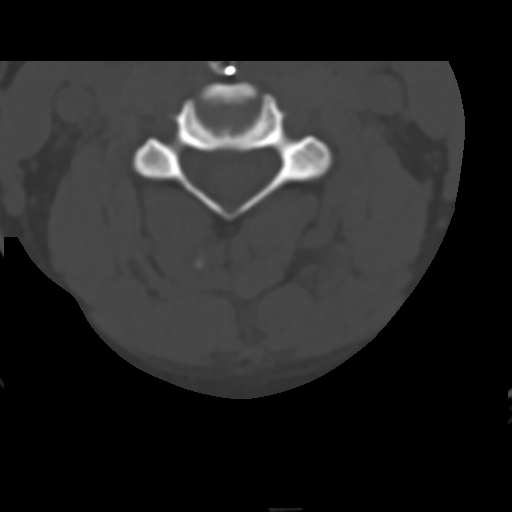
[im 82/109  bone]
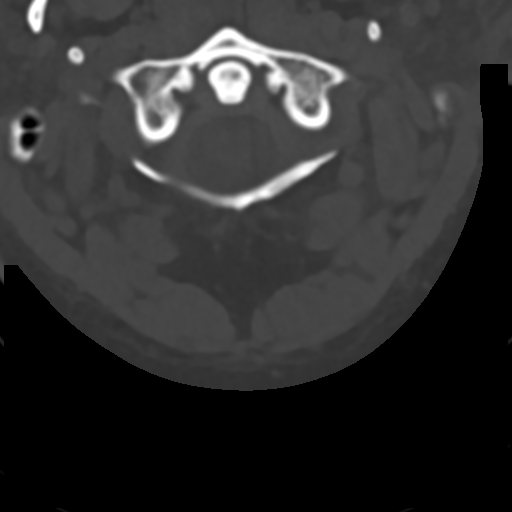

[Series 10: c_spine 2.0 sag bone · sagittal · 0.32mm/px · 4 of 61 slices shown]
[im 13/61  bone]
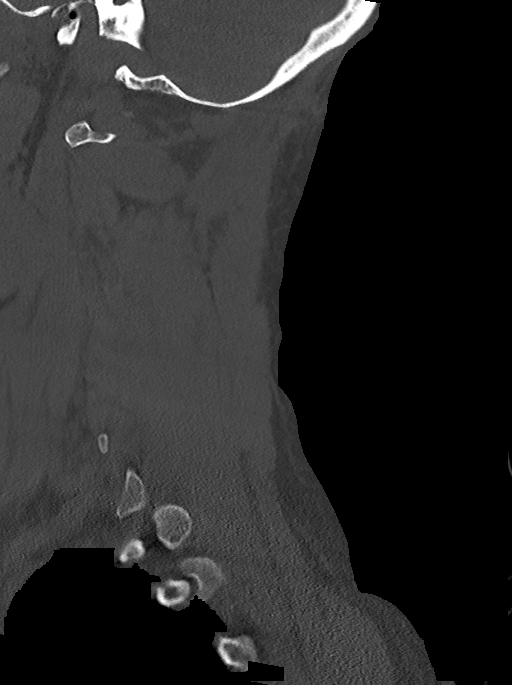
[im 25/61  bone]
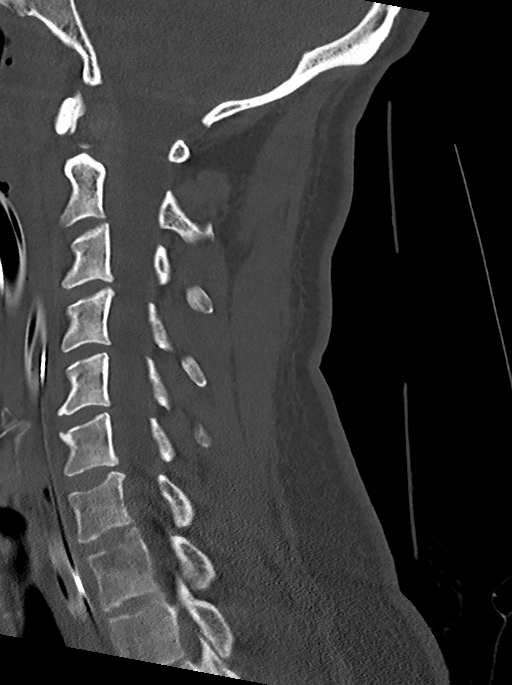
[im 37/61  bone]
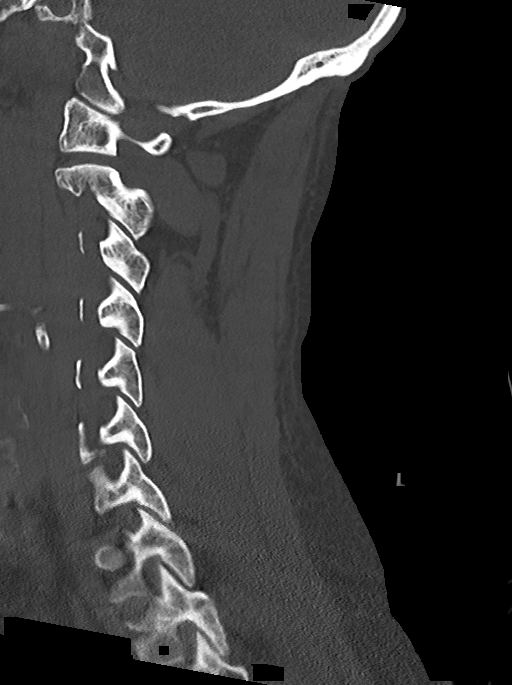
[im 49/61  bone]
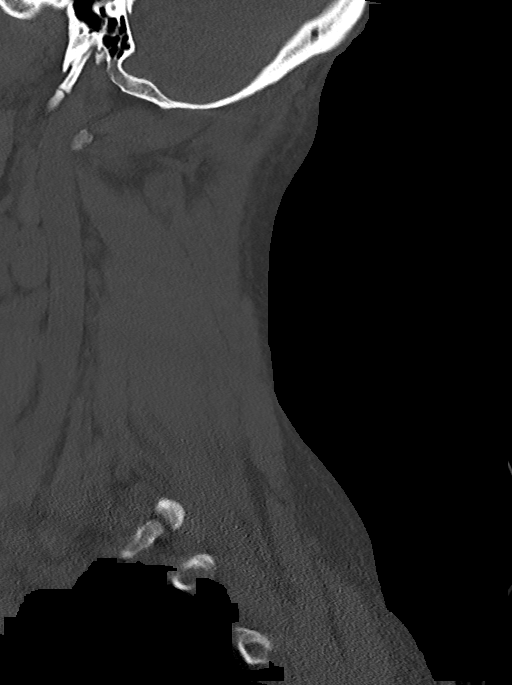

[Series 11: c_spine 2.0 cor bone · coronal · 0.32mm/px · 1 of 69 slices shown]
[im 35/69  bone]
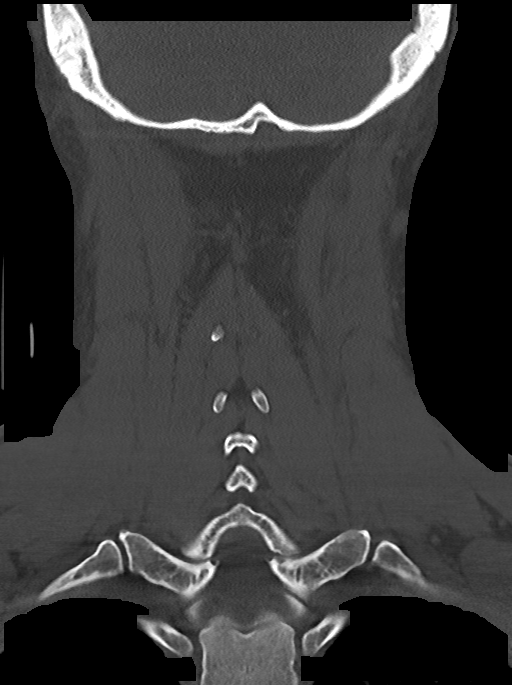

[Series 12: c_spine 2.0 orthogonals · axial · 0.21mm/px · z∈[+837,+898]mm · 2 of 98 slices shown]
[im 33/98  bone]
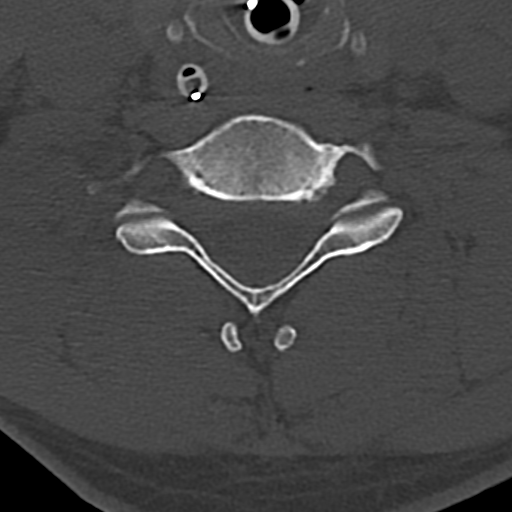
[im 65/98  bone]
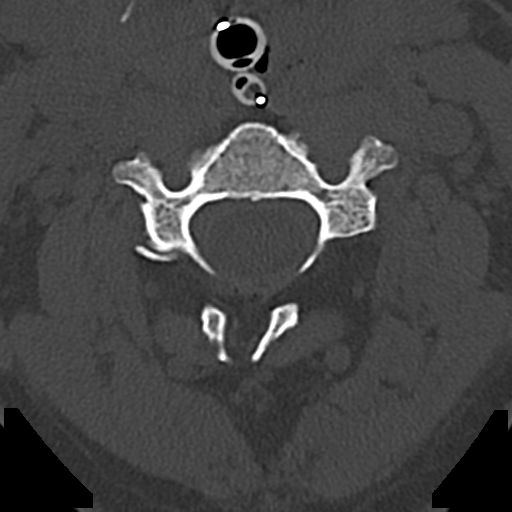

[12 of 33 positions shown; findings below may reference images not displayed]

FINDINGS: CT HEAD FINDINGS

BRAIN: The ventricles and sulci are normal. No intraparenchymal
hemorrhage, mass effect nor midline shift. No acute large vascular
territory infarcts. No abnormal extra-axial fluid collections. Basal
cisterns are midline and not effaced. No acute cerebellar
abnormality.

VASCULAR: Unremarkable.

SKULL/SOFT TISSUES: No skull fracture. No significant soft tissue
swelling.

ORBITS/SINUSES: The included ocular globes and orbital contents are
normal.Moderate ethmoid and mild to moderate frontal sinus mucosal
thickening.

OTHER: None.

CT CERVICAL SPINE FINDINGS

ALIGNMENT: Vertebral bodies in alignment. Maintained lordosis.

SKULL BASE AND VERTEBRAE: Cervical vertebral bodies and posterior
elements are intact. Intervertebral disc heights preserved. No
destructive bony lesions. C1-2 articulation maintained.

SOFT TISSUES AND SPINAL CANAL: Normal.

DISC LEVELS: No significant osseous canal stenosis or neural
foraminal narrowing.

UPPER CHEST: Lung apices are clear.

OTHER: Endotracheal tube and gastric tubes are partially imaged
along the course of the upper trachea and visualized esophagus
respectively.
IMPRESSION: 1. No acute intracranial abnormality. The visualized paranasal
sinuses are notable for mild-to-moderate mucosal thickening of the
ethmoid and frontal sinuses.
2. No acute posttraumatic cervical spine fracture or subluxation.

## 2019-06-15 ENCOUNTER — Observation Stay (HOSPITAL_COMMUNITY)
Admission: RE | Admit: 2019-06-15 | Discharge: 2019-06-16 | Disposition: A | Discharge status: Home / Self Care | Payer: Medicaid Other | Attending: Psychiatry | Admitting: Psychiatry

## 2019-06-15 ENCOUNTER — Encounter (HOSPITAL_COMMUNITY): Payer: Self-pay | Admitting: Psychiatry

## 2019-06-15 DIAGNOSIS — R45851 Suicidal ideations: Principal | ICD-10-CM | POA: Insufficient documentation

## 2019-06-15 DIAGNOSIS — F319 Bipolar disorder, unspecified: Secondary | ICD-10-CM | POA: Insufficient documentation

## 2019-06-15 DIAGNOSIS — F333 Major depressive disorder, recurrent, severe with psychotic symptoms: Secondary | ICD-10-CM | POA: Diagnosis present

## 2019-06-15 DIAGNOSIS — F323 Major depressive disorder, single episode, severe with psychotic features: Secondary | ICD-10-CM | POA: Diagnosis present

## 2019-06-15 DIAGNOSIS — Z20822 Contact with and (suspected) exposure to covid-19: Secondary | ICD-10-CM | POA: Diagnosis not present

## 2019-06-15 DIAGNOSIS — F200 Paranoid schizophrenia: Secondary | ICD-10-CM | POA: Insufficient documentation

## 2019-06-15 DIAGNOSIS — I10 Essential (primary) hypertension: Secondary | ICD-10-CM | POA: Diagnosis not present

## 2019-06-15 DIAGNOSIS — Z79899 Other long term (current) drug therapy: Secondary | ICD-10-CM | POA: Insufficient documentation
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
## 2019-06-16 ENCOUNTER — Other Ambulatory Visit: Payer: Self-pay

## 2019-06-16 DIAGNOSIS — F323 Major depressive disorder, single episode, severe with psychotic features: Secondary | ICD-10-CM | POA: Diagnosis present

## 2019-06-16 DIAGNOSIS — F333 Major depressive disorder, recurrent, severe with psychotic symptoms: Secondary | ICD-10-CM

## 2019-06-16 DIAGNOSIS — R45851 Suicidal ideations: Secondary | ICD-10-CM | POA: Diagnosis not present

## 2019-06-16 LAB — HEMOGLOBIN A1C
Hgb A1c MFr Bld: 5.4 % (ref 4.8–5.6)
Mean Plasma Glucose: 108.28 mg/dL

## 2019-06-16 LAB — MAGNESIUM: Magnesium: 2 mg/dL (ref 1.7–2.4)

## 2019-06-16 LAB — HEPATIC FUNCTION PANEL
ALT: 45 U/L — ABNORMAL HIGH (ref 0–44)
AST: 62 U/L — ABNORMAL HIGH (ref 15–41)
Albumin: 4.3 g/dL (ref 3.5–5.0)
Alkaline Phosphatase: 65 U/L (ref 38–126)
Bilirubin, Direct: 0.1 mg/dL (ref 0.0–0.2)
Indirect Bilirubin: 0.8 mg/dL (ref 0.3–0.9)
Total Bilirubin: 0.9 mg/dL (ref 0.3–1.2)
Total Protein: 7.8 g/dL (ref 6.5–8.1)

## 2019-06-16 LAB — COMPREHENSIVE METABOLIC PANEL
ALT: 44 U/L (ref 0–44)
AST: 62 U/L — ABNORMAL HIGH (ref 15–41)
Albumin: 4.2 g/dL (ref 3.5–5.0)
Alkaline Phosphatase: 61 U/L (ref 38–126)
Anion gap: 10 (ref 5–15)
BUN: 8 mg/dL (ref 6–20)
CO2: 23 mmol/L (ref 22–32)
Calcium: 9.2 mg/dL (ref 8.9–10.3)
Chloride: 106 mmol/L (ref 98–111)
Creatinine, Ser: 0.65 mg/dL (ref 0.61–1.24)
GFR calc Af Amer: >60 mL/min (ref >60)
GFR calc non Af Amer: >60 mL/min (ref >60)
Glucose, Bld: 102 mg/dL — ABNORMAL HIGH (ref 70–99)
Potassium: 4.1 mmol/L (ref 3.5–5.1)
Sodium: 139 mmol/L (ref 135–145)
Total Bilirubin: 1.2 mg/dL (ref 0.3–1.2)
Total Protein: 7.6 g/dL (ref 6.5–8.1)

## 2019-06-16 LAB — LIPID PANEL
Cholesterol: 239 mg/dL — ABNORMAL HIGH (ref 0–200)
HDL: 73 mg/dL (ref >40)
LDL Cholesterol: 158 mg/dL — ABNORMAL HIGH (ref 0–99)
Total CHOL/HDL Ratio: 3.3 RATIO
Triglycerides: 40 mg/dL (ref <150)
VLDL: 8 mg/dL (ref 0–40)

## 2019-06-16 LAB — ETHANOL: Alcohol, Ethyl (B): <10 mg/dL (ref <10)

## 2019-06-16 LAB — SARS CORONAVIRUS 2 BY RT PCR (HOSPITAL ORDER, PERFORMED IN ~~LOC~~ HOSPITAL LAB): SARS Coronavirus 2: NEGATIVE

## 2019-06-16 LAB — TSH: TSH: 4.434 u[IU]/mL (ref 0.350–4.500)

## 2019-06-16 MED ORDER — ACETAMINOPHEN 325 MG PO TABS: 650.0000 mg | ORAL_TABLET | Freq: Four times a day (QID) | ORAL | Status: DC | PRN

## 2019-06-16 MED ORDER — TRAZODONE HCL 100 MG PO TABS: 200.0000 mg | ORAL_TABLET | Freq: Every day | ORAL | 0 refills | Status: AC

## 2019-06-16 MED ORDER — PALIPERIDONE ER 3 MG PO TB24: 3.0000 mg | ORAL_TABLET | Freq: Every day | ORAL | 0 refills | Status: AC

## 2019-06-16 NOTE — Progress Notes (Signed)
Patient ID: Leonard Guerrero, male   DOB: 1975/08/03, 44 y.o.   MRN: [id number] Pt presents with depression, passive SI, and visual hallucinations.  Pt HI towards others but will not elaborate further.  Pt reports he sees bugs crawling on the wall.  Pt anxious but cooperative, pt follows up with [facility] for Psych care.  Skin search completed,  Monitoring for safety.  A&O x 4, no distress noted.

## 2019-06-16 NOTE — Plan of Care (Signed)
[facility] Observation Crisis Plan  Reason for Crisis Plan:  Crisis Stabilization   Plan of Care:  Referral for Inpatient Hospitalization  Family Support:      Current Living Environment:  Living Arrangements: Other relatives(Living w/ aunt)  Insurance:   Hospital Account    Name Acct ID Class Status Primary Coverage   Leonard Guerrero, Leonard Guerrero [id number] BEHAVIORAL HEALTH OBSERVATION [place]        Guarantor Account (for Hospital Account [id number])    Name Relation to Pt Service Area Active? Acct Type   Leonard Guerrero Self [facility] Yes Behavioral Health   Address Phone       [address] [place], [place] [address] [phone](H)          Coverage Information (for Hospital Account [id number])    F/O Payor/Plan Precert #   [facility] [place]    Subscriber Subscriber #   Leonard Guerrero, Leonard Guerrero [id number] P   Address Phone   [address] [place], [place] [address] [phone]      Legal Guardian:     Primary Care Provider:  Patient, No Pcp Per  Current Outpatient Providers:  [facility]  Psychiatrist:  Name of Psychiatrist: Vesta Mixer  Counselor/Therapist:  Name of Therapist: None  Compliant with Medications:  Yes  Additional Information:   Tasia Catchings 5/12/202112:15 AM

## 2019-06-16 NOTE — H&P (Addendum)
BH Observation Unit Provider Admission PAA/H&P  Patient Identification: Leonard Guerrero MRN:  [id number] Date of Evaluation:  06/16/2019 Chief Complaint: SI Principal Diagnosis: MDD (major depressive disorder), recurrent, severe, with psychosis (HCC) Diagnosis:  Principal Problem:   MDD (major depressive disorder), recurrent, severe, with psychosis (HCC)  History of Present Illness:   Leonard Guerrero is an 44 y.o. male who presents to [facility] voluntarily accompanied by his mother for complaints. Pt reports he has been suicidal for 2 days with no plans. Pt reports symptoms of depression, anxiety, irritability,isolation,heklplessness, tearfulness and worthlessness. Pt endorses current SI and HI but did not disclose who he wants to kill. Pt reports vegetative symptoms of inability to get out of bed due to low motivation 2-3 days in a row. Pt endorses visual hallucination, states he sees spots on the floor and bugs crawling. Pt has a history of overdose in 2019. Pt denies self harm and paranoia. Pt states he has access to knives. Pt denies drug use and drinks beer 2-3 days out of the week but only 1-2 cans at a time, last use was in the morning today. Pt gets outpatient resources from from [place]. Pt reports his Hinda Glatter was moved to quarterly from monthly recently and his next appt is June 10. Pt states he sleeps 3-5 hours daily and has a fair appetite. Pt lives with his aunty.  During evaluation pt is sitting; he is alert/oriented x 4; calm/cooperative; and mood is depressed/anxious congruent with affect. Pt is speaking in a clear tone at moderate volume, and normal pace; with fair eye contact. His thought process is coherent and relevant; There is no indication that he is currently responding to internal/external stimuli or experiencing delusional thought content. Pt's insight, judgement and impulse control is fair at this time.    Associated Signs/Symptoms: Depression Symptoms:  depressed mood, suicidal  thoughts with specific plan, anxiety, disturbed sleep, decreased appetite, (Hypo) Manic Symptoms:  Hallucinations, Irritable Mood, Anxiety Symptoms:  Excessive Worry, Psychotic Symptoms:  Hallucinations: Visual PTSD Symptoms: NA Total Time spent with patient: 30 minutes  Past Psychiatric History: Yes  Is the patient at risk to self? Yes.    Has the patient been a risk to self in the past 6 months? No.  Has the patient been a risk to self within the distant past? No.  Is the patient a risk to others? No.  Has the patient been a risk to others in the past 6 months? No.  Has the patient been a risk to others within the distant past? No.   Prior Inpatient Therapy: Prior Inpatient Therapy: Yes Prior Therapy Dates: 12/2017, 09/2018j, 06/2016 Prior Therapy Facilty/Provider(s): [facility] Reason for Treatment: SI, SA Prior Outpatient Therapy: Prior Outpatient Therapy: Yes Prior Therapy Dates: currently Prior Therapy Facilty/Provider(s): [facility] Reason for Treatment: med management Does patient have an ACCT team?: No Does patient have Intensive In-House Services?  : No Does patient have [facility] services? : Yes Does patient have P4CC services?: No  Alcohol Screening: 1. How often do you have a drink containing alcohol?: 2 to 3 times a week 2. How many drinks containing alcohol do you have on a typical day when you are drinking?: 1 or 2 3. How often do you have six or more drinks on one occasion?: Never AUDIT-C Score: 3 4. How often during the last year have you found that you were not able to stop drinking once you had started?: Never 5. How often during the last year have you failed to do  what was normally expected from you becasue of drinking?: Never 6. How often during the last year have you needed a first drink in the morning to get yourself going after a heavy drinking session?: Never 7. How often during the last year have you had a feeling of guilt of remorse after drinking?: Never 8.  How often during the last year have you been unable to remember what happened the night before because you had been drinking?: Never 9. Have you or someone else been injured as a result of your drinking?: No 10. Has a relative or friend or a doctor or another health worker been concerned about your drinking or suggested you cut down?: No Alcohol Use Disorder Identification Test Final Score (AUDIT): 3 Alcohol Brief Interventions/Follow-up: AUDIT Score <7 follow-up not indicated Substance Abuse History in the last 12 months:  No. Consequences of Substance Abuse: NA Previous Psychotropic Medications: Yes  Psychological Evaluations: Yes  Past Medical History:  Past Medical History:  Diagnosis Date  . Bipolar 1 disorder (HCC)   . ETOH abuse   . Hypertension   . Paranoid schizophrenia [facility])     Past Surgical History:  Procedure Laterality Date  . DENTAL SURGERY     complete extraction   Family History:  Family History  Problem Relation Age of Onset  . Mental illness Father   . Heart attack Father   . Hypertension Mother   . Mental illness Sister    Family Psychiatric History: see above Tobacco Screening:   Social History:  Social History   Substance and Sexual Activity  Alcohol Use Yes  . Alcohol/week: 3.0 - 4.0 standard drinks  . Types: 3 - 4 Cans of beer per week     Social History   Substance and Sexual Activity  Drug Use Not on file    Additional Social History: Marital status: Single    Pain Medications: None Prescriptions: Invega shot.  Was getting once a month but [facility] changed it to quarterly shot.  Pt thinks that his next shot is in June. Over the Counter: None History of alcohol / drug use?: Yes Name of Substance 1: ETOH (beer) 1 - Age of First Use: teens 1 - Amount (size/oz): One or two beers 1 - Frequency: "every 2-3 days 1 - Duration: off and on 1 - Last Use / Amount: Had a beer earlier today.                  Allergies:  No Known  Allergies Lab Results:  Results for orders placed or performed during the hospital encounter of 06/15/19 (from the past 48 hour(s))  SARS Coronavirus 2 by RT PCR (hospital order, performed in [facility] hospital lab) Nasopharyngeal Nasopharyngeal Swab     Status: None   Collection Time: 06/15/19 10:19 PM   Specimen: Nasopharyngeal Swab  Result Value Ref Range   SARS Coronavirus 2 NEGATIVE NEGATIVE    Comment: (NOTE) SARS-CoV-2 target nucleic acids are NOT DETECTED. The SARS-CoV-2 RNA is generally detectable in upper and lower respiratory specimens during the acute phase of infection. The lowest concentration of SARS-CoV-2 viral copies this assay can detect is 250 copies / mL. A negative result does not preclude SARS-CoV-2 infection and should not be used as the sole basis for treatment or other patient management decisions.  A negative result may occur with improper specimen collection / handling, submission of specimen other than nasopharyngeal swab, presence of viral mutation(s) within the areas targeted by this assay, and  inadequate number of viral copies (<250 copies / mL). A negative result must be combined with clinical observations, patient history, and epidemiological information. Fact Sheet for Patients:   BoilerBrush.com.cy Fact Sheet for Healthcare Providers: https://pope.com/ This test is not yet approved or cleared  by the [place] FDA and has been authorized for detection and/or diagnosis of SARS-CoV-2 by FDA under an Emergency Use Authorization (EUA).  This EUA will remain in effect (meaning this test can be used) for the duration of the COVID-19 declaration under Section 564(b)(1) of the Act, 21 U.S.C. section 360bbb-3(b)(1), unless the authorization is terminated or revoked sooner. Performed at [facility], [address], [place], [place] [address]     Blood Alcohol level:  Lab Results   Component Value Date   ETH <10 12/08/2017   ETH 230 (H) 06/15/2017    Metabolic Disorder Labs:  Lab Results  Component Value Date   HGBA1C 4.9 12/10/2017   MPG 93.93 12/10/2017   MPG 100 06/18/2016   Lab Results  Component Value Date   PROLACTIN 28.5 (H) 06/18/2016   Lab Results  Component Value Date   CHOL 230 (H) 12/10/2017   TRIG 115 12/10/2017   HDL 72 12/10/2017   CHOLHDL 3.2 12/10/2017   VLDL 23 12/10/2017   LDLCALC 135 (H) 12/10/2017   LDLCALC 142 (H) 06/18/2016    Current Medications: No current facility-administered medications for this encounter.   PTA Medications: Medications Prior to Admission  Medication Sig Dispense Refill Last Dose  . escitalopram (LEXAPRO) 5 MG tablet Take 3 tablets (15 mg total) by mouth daily. For depression 90 tablet 0 Unknown at Unknown time  . gabapentin (NEURONTIN) 300 MG capsule Take 1 capsule (300 mg total) by mouth 3 (three) times daily. For agitation 90 capsule 0 Unknown at Unknown time  . hydrOXYzine (ATARAX/VISTARIL) 25 MG tablet Take 1 tablet (25 mg total) by mouth every 6 (six) hours as needed for anxiety. 60 tablet 0 Unknown at Unknown time  . metoprolol succinate (TOPROL-XL) 100 MG 24 hr tablet Take 1 tablet (100 mg total) by mouth daily. Take with or immediately following a meal: For high blood pressure 10 tablet 0 Unknown at Unknown time  . mirtazapine (REMERON SOL-TAB) 30 MG disintegrating tablet Take 1 tablet (30 mg total) by mouth at bedtime. For depression/insomnia 30 tablet 0 Unknown at Unknown time  . nicotine (NICODERM CQ - DOSED IN MG/24 HOURS) 21 mg/24hr patch Place 1 patch (21 mg total) onto the skin daily at 6 (six) AM. (May purchase from over the counter): For smoking cessation 28 patch 0 Unknown at Unknown time    Musculoskeletal: Strength & Muscle Tone: within normal limits Gait & Station: normal Patient leans: N/A  Psychiatric Specialty Exam: Physical Exam  Constitutional: He is oriented to person,  place, and time. He appears well-developed and well-nourished.  HENT:  Head: Normocephalic.  Eyes: Pupils are equal, round, and reactive to light.  Respiratory: Effort normal.  Musculoskeletal:        General: Normal range of motion.     Cervical back: Normal range of motion.  Neurological: He is alert and oriented to person, place, and time.  Skin: Skin is warm and dry.  Psychiatric: His speech is normal and behavior is normal. Judgment normal. His mood appears anxious. Cognition and memory are normal. He exhibits a depressed mood. He expresses suicidal ideation.    Review of Systems  Psychiatric/Behavioral: Positive for dysphoric mood, hallucinations, sleep disturbance and suicidal  ideas. Negative for decreased concentration and self-injury. The patient is nervous/anxious. The patient is not hyperactive.     Blood pressure 139/90, pulse 87, temperature 98 F (36.7 C), temperature source Oral, resp. rate 20, SpO2 97 %.There is no height or weight on file to calculate BMI.  General Appearance: Casual  Eye Contact:  Fair  Speech:  Normal Rate  Volume:  Normal  Mood:  Anxious, Depressed, Dysphoric, Hopeless and Irritable  Affect:  Congruent  Thought Process:  Coherent and Descriptions of Associations: Intact  Orientation:  Full (Time, Place, and Person)  Thought Content:  Logical  Suicidal Thoughts:  Yes.  without intent/plan  Homicidal Thoughts:  No  Memory:  Recent;   Good  Judgement:  Fair  Insight:  Fair  Psychomotor Activity:  Normal  Concentration:  Concentration: Good  Recall:  Good  Fund of Knowledge:  Good  Language:  Good  Akathisia:  No  Handed:  Right  AIMS (if indicated):     Assets:  Communication Skills Desire for Improvement Financial Resources/Insurance Housing  ADL's:  Intact  Cognition:  WNL  Sleep:       Disposition: Recommend overnight observation for monitoring and stabilization Supportive therapy provided about ongoing stressors.  Treatment  Plan Summary: Daily contact with patient to assess and evaluate symptoms and progress in treatment and Medication management  Observation Level/Precautions:  15 minute checks Laboratory:  Chemistry Profile UDS Psychotherapy:   Medications:   Consultations:   Discharge Concerns:   Estimated LOS: Other:      Mliss Fritz, NP 5/12/20212:17 AM

## 2019-06-16 NOTE — BH Assessment (Signed)
[facility] Assessment Progress Note  Per Caryn Bee, PMHNP, this pt does not require psychiatric hospitalization at this time.  Pt is to be discharged from the [facility] Observation Unit with recommendation to continue treatment with [facility].  This has been included in pt's discharge instructions.  Pt's nurse has been notified.  Doylene Canning, MA Triage Specialist [phone]

## 2019-06-16 NOTE — Progress Notes (Signed)
Nursing  Discharge Note:  D:Patient denies SI/HI/AVH at this time. Pt appears calm and cooperative, and no distress noted.  A: All Personal items in locker returned to pt. Pt given AVS and it was reviewed with him and he verbalized understanding.  Written copies of AVS and prescriptions given to patient. Pt escorted to the lobby to wait for ride to [facility].  R:  Pt States she will comply with follow -up and take his medications as prescribed.

## 2019-06-16 NOTE — Discharge Instructions (Signed)
For your mental health needs, you are advised to continue treatment with [facility]:       [facility]      [address]      [place], [place] [address]      [phone]      Crisis number: [phone]  

## 2019-06-16 NOTE — Progress Notes (Signed)
D: Pt denies SI/HI A/V hallucinations. Pt is anxious and wants medication because he feels his is "wearing off." A: Pt was offered support and encouragement. Q 15 minute checks were done for safety.  R: Pt has no complaints.Pt receptive to treatment and safety maintained on unit.

## 2019-06-16 NOTE — Discharge Summary (Signed)
[facility] ED Discharge  06/16/2019 11:25 AM Leonard Guerrero  MRN:  [id number] Principal Problem: MDD (major depressive disorder), recurrent, severe, with psychosis (HCC) Discharge Diagnoses: Principal Problem:   MDD (major depressive disorder), recurrent, severe, with psychosis (HCC) Active Problems:   MDD (major depressive disorder), single episode, severe with psychotic features [facility])   Subjective: 44 year old male who presents to behavioral health as a walk-in for medication management and symptom management.  As per Leonard Guerrero " I want to get my medications straight.  If feels like it is wearing off sooner than it should be.  I have started seeing things and that is how I know I need my medications adjusted.  "  Per Leonard Guerrero he is currently receiving Invega Trinza 546 mg next dose is due June 10, and his next appointment with the psychiatrist is June 8.  Leonard Guerrero states he would like to receive symptom management and dose adjustment as well as resume his sleeping medication.  He reports he takes trazodone 200 mg p.o. nightly for sleep.  Leonard Guerrero does report having chronic passive, intermittent suicidal thoughts for years.  He denies any current, recent, or active suicidal ideations.  He denies any recent suicidal attempts, noting his last attempt was in 2019.  Leonard Guerrero is able to contract for safety and states his thoughts are not the reason why he came to the hospital.  Leonard Guerrero currently resides with his Guerrero, and has a relationship with his 6 children, and 5 grandchildren.  He is currently receiving disability for his schizophrenia.  He denies any substance abuse, legal charges.  At the time of this evaluation he denies any suicidal ideations, homicidal ideations, and or hallucinations.  Total Time spent with Leonard Guerrero: 45 minutes  Past Psychiatric History: Schizophrenia, alcohol use disorder, polysubstance use disorder.  Currently on InvegaTrinza 546 IM every 3 months.  He is currently being managed at  [facility] with his next appointment July 13, 2019.  He reports 1 suicide attempt in 2019 by overdose.  He reports he spent the night in the emergency room and [facility]  He does have history of 3 [facility].  Past Medical History:  Past Medical History:  Diagnosis Date  . Bipolar 1 disorder (HCC)   . ETOH abuse   . Hypertension   . Paranoid schizophrenia [facility])     Past Surgical History:  Procedure Laterality Date  . DENTAL SURGERY     complete extraction   Family History:  Family History  Problem Relation Age of Onset  . Mental illness Father   . Heart attack Father   . Hypertension Mother   . Mental illness Sister    Family Psychiatric  History: As per Leonard Guerrero his sister and father have severe mental illness, intake several medications to manage their symptoms. Social History:  Social History   Substance and Sexual Activity  Alcohol Use Yes  . Alcohol/week: 3.0 - 4.0 standard drinks  . Types: 3 - 4 Cans of beer per week     Social History   Substance and Sexual Activity  Drug Use Not on file    Social History   Socioeconomic History  . Marital status: Single    Spouse name: Not on file  . Number of children: Not on file  . Years of education: Not on file  . Highest education level: Not on file  Occupational History  . Not on file  Tobacco Use  . Smoking status: Current  Every Day Smoker    Packs/day: 1.00    Years: 10.00    Pack years: 10.00    Types: Cigarettes  . Smokeless tobacco: Never Used  Substance and Sexual Activity  . Alcohol use: Yes    Alcohol/week: 3.0 - 4.0 standard drinks    Types: 3 - 4 Cans of beer per week  . Drug use: Not on file  . Sexual activity: Not on file  Other Topics Concern  . Not on file  Social History Narrative   ** Merged History Encounter **       Social Determinants of Health   Financial Resource Strain:   . Difficulty of Paying Living  Expenses:   Food Insecurity:   . Worried About Programme researcher, broadcasting/film/video in the Last Year:   . Barista in the Last Year:   Transportation Needs:   . Freight forwarder (Medical):   Marland Kitchen Lack of Transportation (Non-Medical):   Physical Activity:   . Days of Exercise per Week:   . Minutes of Exercise per Session:   Stress:   . Feeling of Stress :   Social Connections:   . Frequency of Communication with Friends and Family:   . Frequency of Social Gatherings with Friends and Family:   . Attends Religious Services:   . Active Member of Clubs or Organizations:   . Attends Banker Meetings:   Marland Kitchen Marital Status:     Has this Leonard Guerrero used any form of tobacco in the last 30 days? (Cigarettes, Smokeless Tobacco, Cigars, and/or Pipes)   Current Medications: Current Facility-Administered Medications  Medication Dose Route Frequency Provider Last Rate Last Admin  . acetaminophen (TYLENOL) tablet 650 mg  650 mg Oral Q6H PRN Anike, Adaku C, NP       PTA Medications: Medications Prior to Admission  Medication Sig Dispense Refill Last Dose  . Paliperidone Palmitate ER (INVEGA TRINZA) 546 MG/1.75ML SUSY Inject 546 mg into the muscle every 3 (three) months. Next dose  July 15, 2019       Musculoskeletal: Strength & Muscle Tone: spastic Gait & Station: normal Leonard Guerrero leans: Right  Psychiatric Specialty Exam: Physical Exam  Review of Systems  Blood pressure 139/90, pulse 87, temperature 98 F (36.7 C), temperature source Oral, resp. rate 20, SpO2 97 %.There is no height or weight on file to calculate BMI.  General Appearance: Fairly Groomed  Eye Contact:  Fair  Speech:  Clear and Coherent and Normal Rate  Volume:  Normal  Mood:  Anxious  Affect:  Appropriate and Congruent  Thought Process:  Coherent, Linear and Descriptions of Associations: Intact  Orientation:  Full (Time, Place, and Person)  Thought Content:  Logical and Hallucinations: Visual  Suicidal Thoughts:   Denies at this time  Homicidal Thoughts:  No  Memory:  Immediate;   Fair Recent;   Fair  Judgement:  Intact  Insight:  Present  Psychomotor Activity:  Increased, Restlessness, TD and Tremor  Concentration:  Concentration: Fair and Attention Span: Fair  Recall:  [employer] of Knowledge:  Fair  Language:  Fair  Akathisia:  Yes  Handed:  Right  AIMS (if indicated):     Assets:  Communication Skills Desire for Improvement Financial Resources/Insurance Physical Health Resilience Social Support  ADL's:  Intact  Cognition:  WNL  Sleep:      Salam is a 44 year old male who presents to [facility] voluntarily for complaints of worsening hallucinations, medication management and suicidal ideations.  During the evaluation Leonard Guerrero did admit to suicidal ideations however reports they are intermittent and chronic in nature.  He states he presented for symptom management, as his recently receive injection of Invega is wearing off.  He denies any history of paranoia, auditory hallucinations, rumination.  He does not appear to be responding to internal stimuli, and or preoccupied.  During the evaluation Leonard Guerrero is observed to be pacing the floor, and while sitting on the bed he appeared to be restless, yet calm and cooperative.  He is noted to have dystonia, akathisia, pseudoparkinsonism, and dyskinesia.  This is discussed with Leonard Guerrero and he is advised to follow-up with psychiatrist for symptom management.  He reports previously being tried on Cogentin however it was ineffective and no additional medications were tried.  Discussed briefly additional medications and alternatives with Leonard Guerrero.  Demographic Factors:  Male, Caucasian, Low socioeconomic status and Unemployed  Loss Factors: Decrease in vocational status  Historical Factors: Prior suicide attempts and Family history of mental illness or substance abuse  Risk Reduction Factors:   Sense of responsibility to family, Living with another person,  especially a relative, Positive social support, Positive therapeutic relationship and Positive coping skills or problem solving skills  Continued Clinical Symptoms:  Alcohol/Substance Abuse/Dependencies Schizophrenia:   Paranoid or undifferentiated type More than one psychiatric diagnosis Previous Psychiatric Diagnoses and Treatments  Cognitive Features That Contribute To Risk:  None    Suicide Risk:  Minimal: No identifiable suicidal ideation.  Patients presenting with no risk factors but with morbid ruminations; may be classified as minimal risk based on the severity of the depressive symptoms    Plan Of Care/Follow-up recommendations:  Other:  Follow up with psychiatry regarding EPS symptoms.  Prescription for trazodone and oral Invega, have been sent to your pharmacy.  Please discuss with psychiatrist regarding increase in your Invega injection to help with symptom management.  In the meantime will provide prescription for 30 days of oral Invega to help reduce hallucinations.  Disposition: We will discharge home at this time.  Prescription has been sent to the pharmacy. Suella Broad, FNP 06/16/2019, 11:25 AM

## 2020-01-19 NOTE — H&P (Signed)
 [facility]   History and Physical   Assessment  Active Hospital Problems   Nuclear sclerotic cataract of right eye    Cataract Right Eye  Plan  Phaco/PCIOL Right Eye  History  Leonard Guerrero is a 44 y.o. male who presents with decreased vision and glare secondary to cataract right eye.  Please see Chart Review/Media/Medical necessity for full details. History of Present Illness Cataract right eye  Past Medical History:  Diagnosis Date  . Schizophrenia (*)    History reviewed. No pertinent surgical history.  No Known Allergies Prior to Admission medications   Medication Sig Start Date End Date Taking? Authorizing Provider  besifloxacin (BESIVANCE) 0.6% SUSP 1 drop 3 (three) times a day.   Yes Historical Provider, MD  Bromfenac Sodium (PROLENSA) 0.07 % SOLN opthalmic solution daily.   Yes Historical Provider, MD  prednisoLONE acetate (PRED FORTE,ECONOPRED) 1% ophthalmic suspension SMARTSIG:In Eye(s) 01/05/20  Yes Historical Provider, MD  traZODone  (DESYREL ) 150 MG tablet Take 150 mg by mouth at bedtime.   Yes Historical Provider, MD  Paliperidone  Palmitate ER (INVEGA  HAFYERA) 1560 MG/5ML SUSY Inject into the muscle.    Historical Provider, MD   Social History   Socioeconomic History  . Marital status: Divorced    Spouse name: None  . Number of children: None  . Years of education: None  . Highest education level: None  Occupational History  . None  Tobacco Use  . Smoking status: Current Every Day Smoker    Packs/day: 2.00    Types: Cigarettes  . Smokeless tobacco: Never Used  Substance and Sexual Activity  . Alcohol use: Yes    Alcohol/week: 100.0 standard drinks    Types: 100 Cans of beer per week    Comment: case of beer daily  . Drug use: Never  . Sexual activity: None  Other Topics Concern  . None  Social History Narrative  . None   Social Determinants of Health   Financial Resource Strain: Not on file  Food Insecurity: Not on file   Transportation Needs: Not on file  Physical Activity: Not on file  Stress: Not on file  Social Connections: Not on file  Intimate Partner Violence: Not on file  Housing Stability: Not on file   History reviewed. No pertinent family history. Review of Systems   Physical Examination  Temp:  [97.6 F (36.4 C)] 97.6 F (36.4 C) Heart Rate:  [79] 79 Resp:  [18] 18 BP: (139)/(80) 139/80 SpO2:  [96 %] 96 % General: well developed ENT: Normal Neck: Normal Chest: Normal Heart: Normal  Physical Exam Results  Labs:  No results found for this or any previous visit (from the past 24 hour(s)). Imaging: No results found.   Electronically signed: Evalene JONELLE Raw, MD 01/19/2020 / 6:51 AM

## 2020-01-19 NOTE — Anesthesia Postprocedure Evaluation (Addendum)
   Patient: Leonard Guerrero Procedure(s): RIGHT EYE PHACO WITH INTRAOCULAR LENS  Anesthesia type: general  Patient location:  PACU Patient participation:  Patient able to participate in this evaluation at age appropriate level.  BP: 130/73 Heart Rate: 84 Resp: 16 Temp: 98.9 F (37.2 C) SpO2: 93 % Weight: 90.3 kg (199 lb)    Post vital signs:   stable Level of consciousness:   awake, alert and oriented  Post-anesthesia pain:   adequate analgesia Airway patency:   patent Respiratory:   unassisted, respiration function adequate, spontaneous ventilation Cardiovascular:   stable, blood pressure acceptable and heart rate acceptable Hydration:   adequate hydration Temperature: temperature adequate >96.13F PONV:  nausea and vomiting controlled Regional anesthesia: no block performed   Anesthetic complications:   no

## 2020-01-19 NOTE — Anesthesia Preprocedure Evaluation (Addendum)
  Relevant Problems  No relevant active problems     Anesthesia Evaluation   Patient summary reviewed   No history of anesthetic complications  Airway  Mallampati: II TM distance: >3 FB    Neck ROM: full  Dental   (+) edentulous   Pulmonary    breath sounds clear to auscultation (+) tobacco use, a smoker pack-years,  Counseling provided (-) COPD, asthma, sleep apnea  Cardiovascular - negative ROS Exercise tolerance: > or = 4 METS  Rhythm: regular Rate: normal   Neuro/Psych   (-) seizures, TIA, CVA GCS Total Score = 15.  eye opening = 4 verbal response = 5 best motor response = 6 pupil unreactive to light = 0      GI/Hepatic/Renal   (+)  no renal disease,   (-) GERD, liver disease   Endo/Other   (-) diabetes mellitus, hypothyroidism, blood dyscrasia  Abdominal  - normal exam     Other findings:  Height Weight BSA Ht Readings from Last 1 Encounters: No data found for Ht  Wt Readings from Last 1 Encounters: 01/19/20 : 90.3 kg (199 lb)  There is no height or weight on file to calculate BSA. Pulse Blood Pressure Resp. Pulse Readings from Last 1 Encounters: 01/19/20 : 79  BP Readings from Last 1 Encounters: 01/19/20 : 139/80  Resp Readings from Last 1 Encounters: 01/19/20 : 18  BMI Temperature O2 BMI Readings from Last 1 Encounters: No data found for BMI  Temp Readings from Last 1 Encounters: 01/19/20 : 97.6 F (36.4 C) (Oral)  SpO2 Readings from Last 1 Encounters: 01/19/20 : 96%          Anesthesia Plan (LMA Apparently, patient was not able to keep head still for first cataract (at outside surgical center) and required propofol  infusion to complete case; surgeon has requested general today.)Anesthetic plan and risks discussed with patient. ASA 2   general   Plan Factors: The patient is a current smoker.  PAT Visit needed?  Plan discussed with CRNA.  intravenous induction       Date of last liquid: 01/18/20  Time of last liquid: 2300   Date of last solid: 01/18/20  Time of last solid: 2300    BP: 139/80 Heart Rate: 79 Resp: 18 Temp: 97.6 F (36.4 C) SpO2: 96 % Weight: 90.3 kg (199 lb)

## 2021-01-11 NOTE — ED Provider Notes (Signed)
 Patient placed in First Look pathway, seen and evaluated for chief complaint of detox from methamphetamines.  Patient also reports alcohol use.  Pertinent exam findings include appears nontoxic. Based on initial evaluation, labs are currently indicated and radiology studies are not currently indicated as allowed for current processes and treatments as applicable in a triage setting and could be different than if patient were seen in a main treatment area or dependent on labs/imagining after results are displayed.  Patient counseled on process, plan, and necessity for staying for completing the evaluation.   This document serves as a record of services personally performed by Fonda Sheffield PA-C.  The creation of this record is the provider's dictation and/or activities during the visit.    Note By: Fonda Sheffield, PA-C 4:29 PM   [facility] Emergency Department Emergency Department Provider Note  This document was created using the aid of voice recognition Dragon dictation software.   Provider at bedside: 01/12/2021 1:38 AM  History obtained from the: Patient  History   Chief Complaint  Patient presents with  . Drug / Alcohol Assessment    45 year old male presents to ED for evaluation of detox.  Patient reports he is attempting to come off amphetamines as well as alcohol.  He denies other recreational drugs.  Denies suicidal or homicidal ideations.  Patient states that he did attempt to go to [facility] however was told that his blood sugar was abnormal and he could not proceed.  Patient denies a history of diabetes.  He endorses no chest pain, shortness of breath, tremors or other acute findings. Unable to quantify how regular alcohol usage is.    History provided by:  The patient Language interpreter used?:  No formal interpreter used   1:38 AM Previous medical records reviewed from [facility] Everywhere and EPIC Chart Review.   No LMP for male patient.  Past  Medical History Past Medical History:  Diagnosis Date  . Alcohol abuse   . Auditory hallucinations 06/15/2017  . Homeless   . Methamphetamine use (HCC)   . 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 Schizoaffective disorder, depressive type (HCC) 06/15/2017  . Suicide attempt by cutting of wrist (HCC) 10/20/2016   Pt stated cut left wrist because command auditory hallucinations.    Past Surgical History No past surgical history on file.  Medications These were reviewed. See nursing note for details.  Allergies Patient has no known allergies.  Family History History reviewed. No pertinent family history.  Social History    All family history, social history and PMH were reviewed by myself. See nursing note for details.   Review of Systems  Review of Systems  Constitutional: Negative for chills and fever.  HENT: Negative for congestion.   Respiratory: Negative for cough and shortness of breath.   Cardiovascular: Negative for chest pain and palpitations.  Gastrointestinal: Negative for abdominal pain, diarrhea, nausea and vomiting.  Musculoskeletal: Negative for arthralgias.  All other systems reviewed and are negative.   Physical Exam   Vitals:   01/11/21 1627  BP: 119/78  Pulse: 80  Temp: 98.3 F (36.8 C)  Resp: 19  SpO2: 98%    Physical Exam Vitals and nursing note reviewed.  Constitutional:      General: He is not in acute distress.    Appearance: He is well-developed. He is not diaphoretic.  HENT:     Head: Normocephalic and atraumatic.     Right Ear: External ear normal.  Left Ear: External ear normal.     Nose: Nose normal.     Mouth/Throat:     Mouth: Mucous membranes are moist.     Pharynx: No oropharyngeal exudate or posterior oropharyngeal erythema.  Eyes:     Conjunctiva/sclera: Conjunctivae normal.  Neck:     Thyroid : No thyromegaly.  Cardiovascular:     Rate and Rhythm: Normal rate and regular rhythm.     Pulses: Normal  pulses.     Heart sounds: Normal heart sounds.  Pulmonary:     Effort: Pulmonary effort is normal.     Breath sounds: Normal breath sounds.  Abdominal:     General: Bowel sounds are normal.     Palpations: Abdomen is soft.     Tenderness: There is no abdominal tenderness.  Musculoskeletal:        General: No deformity. Normal range of motion.     Cervical back: Normal range of motion and neck supple.  Skin:    General: Skin is warm and dry.     Capillary Refill: Capillary refill takes less than 2 seconds.  Neurological:     General: No focal deficit present.     Mental Status: He is alert and oriented to person, place, and time.  Psychiatric:        Mood and Affect: Mood normal.        Behavior: Behavior normal.     Results  LABS Labs Reviewed  COMPREHENSIVE METABOLIC PANEL - Abnormal; Notable for the following components:      Result Value   BUN 6 (*)    All other components within normal limits  CBC WITH AUTO DIFFERENTIAL PANEL - Abnormal; Notable for the following components:   Platelets 122 (*)    All other components within normal limits  ETHANOL - Normal   Narrative:    NOT FOR FORENSIC USE  COVID ANTIGEN - Normal  CBC AND DIFFERENTIAL   Narrative:    The following orders were created for panel order CBC and Differential. Procedure                               Abnormality         Status                    ---------                               -----------         ------                    CBC and Differential[[id number]]         Abnormal            Final result               Please view results for these tests on the individual orders.  URINALYSIS WITH MICROSCOPIC  HOLD FOR URINE CULTURE  URINE DRUG SCREEN    Radiology  No results found for any visits on 01/11/21.  EKG   ED Course     Medications Given in Emergency Department  Medications - No data to display  Procedure Note    Medical Decision Making     MDM Number of Diagnoses or  Management Options Methamphetamine abuse [facility]) Diagnosis management comments: 45 year old male presents to ED for evaluation of  alcohol and amphetamine detox.  Patient reports his last drink was prior to arrival.  Patient was evaluated with labs which were negative alcohol level as well as no evidence of elevated glucose levels, abnormal LFTs or abnormal blood cell count.  COVID is negative.  Patient denies any use of other drugs aside from amphetamines.  As patient's alcohol is negative and he has no evidence of withdrawal symptoms, patient was advised he would benefit from outpatient management with [facility].  A team did review his chart and believes that he would not qualify for admission at this time.  Patient is advised these findings.  He is given information to follow-up with [facility] services moving forward.  Strongly encouraged to return to the ED for any new or acutely worsening symptoms.  Agreeable with this plan of care for outpatient disposition at this time.    Amount and/or Complexity of Data Reviewed Clinical lab tests: ordered and reviewed Decide to obtain previous medical records or to obtain history from someone other than the patient: yes Review and summarize past medical records: yes Independent visualization of images, tracings, or specimens: yes  Risk of Complications, Morbidity, and/or Mortality Presenting problems: moderate Diagnostic procedures: moderate Management options: moderate    ED Clinical Impression   1. Methamphetamine abuse (HCC)      Medication List    ASK your doctor about these medications   * Invega  Hafyera 1,560 mg/5 mL injection Generic drug: paliperidone  palmitate (77-month)   * Invega  Trinza 546 mg/1.75 mL Syrg Generic drug: paliperidone  palm (69-month)   traZODone  150 MG tablet Commonly known as: DESYREL       * This list has 2 medication(s) that are the same as other medications prescribed for you. Read the directions carefully, and ask  your doctor or other care provider to review them with you.         FOLLOW UP [facility] [address] [place]   [id number] [phone] Schedule an appointment as soon as possible for a visit in 3 days For reevaluation  _____________________________   Electronically signed by: Alfonso Rosaline Salon, PA-C 01/12/21 [phone]

## 2021-01-11 NOTE — ED Triage Notes (Addendum)
 Pt here for detox of Meth and ETOH. Last used this AM. Pt homeless

## 2021-03-08 NOTE — ED Provider Notes (Signed)
 [facility] Emergency Department Emergency Department Provider Note  ___________________________________________    Time seen: 02/02/233:07 PM I have reviewed the triage vital signs and the nursing notes.     History   Chief Complaint Chief Complaint  Patient presents with  . Chest Pain    HPI  Leonard Guerrero is a 46 y.o. male with a PMHx of homelessness, schizoaffective disorder, paranoid type delusional disorder, alcohol abuse, methamphetamine abuse who presents with shortness of breath and cough. Patient endorses four days of severe cough, shortness of breath, chest pain, and subjective fevers. He reports associated decreased food intake. Patient describes the chest pain as intermittent and primarily with coughing or lying down. Patient states he develops these symptoms every two to three months, and they are usually improved after breathing treatments. However, the treatments have provided no relief from his symptoms at present. Patient admits to regular alcohol and cocaine use.    Denies known sick contacts. Denies any history of asthma, COPD, or pneumonia. Denies a history of IV drug use. Denies oxygen requirement at baseline. Denies congestion, sore throat, nausea, vomiting, myalgias, headaches.    ROS/HISTORY OF PRESENT ILLNESS LIMITED BY: None  Additional History:   Reviewed the patient's ED visit from 01/11/2021. He presented requesting detoxification from amphetamines and alcohol.    Past Medical History:  Diagnosis Date  . Alcohol abuse   . Auditory hallucinations 06/15/2017  . Homeless   . Methamphetamine use (HCC)   . 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 Schizoaffective disorder, depressive type (HCC) 06/15/2017  . Suicide attempt by cutting of wrist (HCC) 10/20/2016   Pt stated cut left wrist because command auditory hallucinations.    History reviewed. No pertinent surgical history.   Current Facility-Administered Medications:   .  0.9%  NaCl infusion, , Intravenous, Continuous, Mohammad S Rasool, MD .  albuterol 2.5 mg /3 mL (0.083 %) nebulizer solution 2.5 mg, 2.5 mg, Nebulization, Once, Carlin Jude Lou Raddle., MD .  azithromycin (ZITHROMAX) 500 mg in sodium chloride  0.9 % 250 mL IVPB, 500 mg, Intravenous, Once, Carlin Jude Lou Raddle., MD, Last Rate: 250 mL/hr at 03/08/21 1631, 500 mg at 03/08/21 1631 .  [START ON 03/09/2021] azithromycin (ZITHROMAX) 500 mg in sodium chloride  0.9 % 250 mL IVPB, 500 mg, Intravenous, Q24H, Mohammad S Rasool, MD .  NOREEN ON 03/09/2021] cefTRIAXone (ROCEPHIN) 1 g in sodium chloride  0.9 % 100 mL MBP, 1 g, Intravenous, Q24H, Mohammad S Rasool, MD .  ipratropium (ATROVENT) 0.02 % nebulizer solution 0.5 mg, 0.5 mg, Nebulization, Once, Carlin Jude Lou Raddle., MD .  ipratropium 0.5 mg + albuterol 2.5 mg nebulizer solution (DuoNeb equivalent), , Nebulization, Q4H PRN, Mohammad S Rasool, MD  Current Outpatient Medications:  .  paliperidone  palm, 80-month, (INVEGA  TRINZA) 546 mg/1.75 mL Syrg, Inject 1.75 mLs (546 mg total) into the muscle. Due feb 28, Disp: , Rfl:  .  paliperidone  palmitate, 28-month, (INVEGA  HAFYERA) 1,560 mg/5 mL injection, Inject into the muscle., Disp: , Rfl:  .  traZODone  (DESYREL ) 150 MG tablet, Take 150 mg by mouth., Disp: , Rfl:   Allergies Patient has no known allergies.  History reviewed. No pertinent family history.  Social History Social History   Tobacco Use  . Smoking status: Every Day    Packs/day: 2.00    Types: Cigarettes  . Smokeless tobacco: Never  Substance Use Topics  . Alcohol use: Yes    Alcohol/week: 12.0 standard drinks    Types: 12 Cans of beer (  12 oz./can) per week  . Drug use: Yes    Types: Marijuana     Review of Systems Constitutional: Negative for fever or chills. Patient endorses decreased food intake. Positive for subjective fevers.  Eyes: Negative for visual changes or discharge. ENT: Negative for sore throat, difficulty swallowing  or ear pain. Cardiovascular: Negative for palpitations, tachycardia or dyspnea. Positive for intermittent chest pain.  Respiratory: Negative for or dyspnea. Positive for shortness of breath and cough.  Gastrointestinal: Negative for nausea, vomiting, diarrhea or abdominal pain. Genitourinary: Negative for dysuria, hematuria or frequency. Musculoskeletal: Negative for myalgias, arthralgias, or back pain. Skin: Negative for rash. Neurological: Negative for headaches, neck pain, focal weakness or numbness.  Psychiatric: Negative for suicidal ideation.   Physical Exam   VITAL SIGNS:   ED Triage Vitals [03/08/21 1354]  Enc Vitals Group     BP 120/76     Pulse 102     Resp 24     Temp 100.1 F (37.8 C)     Temp Source Oral     SpO2 90 %     Weight 65.8 kg (145 lb)     Height 1.829 m (6')     Head Circumference      Peak Flow      Pain Score      Pain Loc      Pain Edu?      Excl. in GC?     General appearance: Alert and oriented.  Disheveled appearing middle-aged appearing white male.  In specific discussion with the patient the patient is noted to have a history of some substance abuse but not IV drug use.  The patient has had about a 3 to 5-day history of increasing shortness of breath and the patient is presently oxygen dependent.  Was hypoxic on room air he is presently on 5 L with a saturation of 97% and the blood gas was obtained on oxygen.  Does have a congested semiproductive cough. Vital signs reviewed.  Febrile and diaphoretic Eyes: PERRL, EOMI, conjunctivae are normal. ENT      Head: Normocephalic and atraumatic.      Nose: No congestion.      Mouth/Throat: Mucous membranes are moist. No tonsil inflammation.      Neck: No stridor, no neck tenderness, no lymphadenopathy.      TMs: clear , canal clear Back: no tenderness, no CVA tenderness Chest Wall: no tenderness Respiratory: No increased respiratory effort is noted.  slightly congested cough. Cardiovascular: Normal  rate, regular rhythm, no murmurs. Abdomen: Soft and nontender. Normal BS. No guarding.  GU: Musculoskeletal: Nontender with no deformities. No edema.  Neurologic: Normal speech and language. No gross focal neurologic deficits are appreciated. Glascow Coma Score: 4 - Opens eyes on own; 6 - Follows simple motor commands; 5 - Alert and oriented; GCS Total: 15 Skin: Skin is warm, dry and intact. No rash noted. Psychiatric: Mood and affect are normal.  Patient readily admits to alcohol and methamphetamine utilization but he does not do IV drug use.  EKG   Time: 1356 Rate: 101 Rhythm: Sinus Tachycardia Axis: normal Intervals: Normal ST-T Waves: Nonspecific ST abnormality Comparison with Old: No previous available for comparison.   Radiology   All X-rays, CTs, and MRIs interpreted by me and radiologist.  Results for orders placed or performed during the hospital encounter of 03/08/21  CTA CHEST (PE STUDY) W CONTRAST   Narrative   CLINICAL DATA:  Pulmonary embolism (PE) suspected, high prob  EXAM:  CT ANGIOGRAPHY CHEST WITH CONTRAST  TECHNIQUE: Multidetector CT imaging of the chest was performed using the standard protocol during bolus administration of intravenous contrast. Multiplanar CT image reconstructions and MIPs were obtained to evaluate the vascular anatomy.  RADIATION DOSE REDUCTION: This exam was performed according to the departmental dose-optimization program which includes automated exposure control, adjustment of the mA and/or kV according to patient size and/or use of iterative reconstruction technique.  CONTRAST:  80ml omni350  COMPARISON:  None.  FINDINGS: Cardiovascular: Satisfactory opacification of the pulmonary arteries to the segmental level. Limited evaluation of the subsegmental level due to motion artifact and timing of intravenous contrast. No evidence of pulmonary embolism. The main pulmonary artery is normal in caliber. Normal heart size. No  pericardial effusion.  Mediastinum/Nodes: Couple of enlarged right hilar lymph nodes: 1.1 cm and 1.2 cm (5: 133, 147). No enlarged mediastinal, left hilar, or axillary lymph nodes. Thyroid  gland, trachea, and esophagus demonstrate no significant findings.  Lungs/Pleura: Diffuse mild bronchial wall thickening. Peribronchovascular tree-in-bud nodularity within the right upper lobe, right middle lobe. Bilateral lower lobe subsegmental atelectasis. No pulmonary mass. No pleural effusion. No pneumothorax.  Upper Abdomen: No acute abnormality.  Musculoskeletal:  No chest wall abnormality.  No suspicious lytic or blastic osseous lesions. No acute displaced fracture. Multilevel degenerative changes of the spine.  Review of the MIP images confirms the above findings.  IMPRESSION: 1. Diffuse bronchial wall thickening with right upper and middle lobe peribronchovascular tree-in-bud nodularity suggestive of bronchopneumonia. No follow-up needed if patient is low-risk (and has no known or suspected primary neoplasm). Non-contrast chest CT can be considered in 12 months if patient is high-risk. This recommendation follows the consensus statement: Guidelines for Management of Incidental Pulmonary Nodules Detected on CT Images: From the Fleischner Society 2017; Radiology 2017; 284:228-243. 2. Right hilar lymphadenopathy likely reactive in etiology. 3. No central or segmental pulmonary embolus. Limited evaluation more distally. 4.  Aortic Atherosclerosis (ICD10-I70.0).   Electronically Signed   By: Morgane  Naveau M.D.   On: 03/08/2021 16:32   XR CHEST AP  PORTABLE   Narrative   CLINICAL DATA:  Shortness of breath  EXAM: PORTABLE CHEST 1 VIEW  COMPARISON:  Chest radiograph dated December 09, 2017  FINDINGS: The heart size and mediastinal contours are within normal limits. Hyperinflated lungs with mild bilateral lower lobe bronchial thickening concerning for bronchitis. No focal  consolidation or pleural effusion. The visualized skeletal structures are unremarkable.  IMPRESSION: Bilateral lower lobe bronchial thickening concerning for bronchitis. No focal consolidation or pleural effusion.   Electronically Signed   By: Imran  Ahmed D.O.   On: 03/08/2021 14:56     Labs   Recent Results (from the past 72 hour(s))  POCT ISTAT Blood Gas+Lactate  Result Value Ref Range   ISTAT - SAMPLE ARTERIAL    PH, POC 7.420 7.350 - 7.450   PCO2, POC 38.2 35 - 45 mmHg   PO2, POC 83 80 - 100 mmHg   HCO3, POC 24.7 22 - 26 mmol/L   BASE EXCESS, POC 0 (-2)-(+3) mmol/L   O2 SAT, POC 96 >95 %   FIO2, POC 40    PT TEMP, POC Not Entered    TEMPERATURE CORRECTED PH, POC Test Not Done    TEMPERATURE CORRECTED PCO2, POC Test Not Done    TEMPERATURE CORRECTED PO2, POC Test Not Done    TOTAL CO2, POC 26 23 - 30 mmol/L   LACTATE, POC 0.87 0.36 - 1.25 mmol/L  Ethanol  Result  Value Ref Range   Alcohol <10 <=10 MG/DL   Narrative   NOT FOR FORENSIC USE  CBC and Differential  Result Value Ref Range   WBC 18.2 (H) 4.4 - 11.0 x 10*3/uL   RBC 4.65 4.50 - 5.90 x 10*6/uL   Hemoglobin 15.1 14.0 - 17.5 G/DL   Hematocrit 56.6 58.4 - 50.4 %   MCV 93.2 80.0 - 96.0 FL   MCH 32.4 27.5 - 33.2 PG   MCHC 34.8 33.0 - 37.0 G/DL   RDW 84.9 87.6 - 82.9 %   Platelets 139 (L) 150 - 450 X 10*3/uL   MPV 8.5 6.8 - 10.2 FL   Neutrophil % 81 %   Lymphocyte % 9 %   Monocyte % 9 %   Eosinophil % 1 %   Basophil % 0 %   Neutrophil Absolute 14.8 (H) 1.8 - 7.8 x 10*3/uL   Lymphocyte Absolute 1.7 1.0 - 4.8 x 10*3/uL   Monocyte Absolute 1.6 (H) 0.0 - 0.8 x 10*3/uL   Eosinophil Absolute 0.1 0.0 - 0.5 x 10*3/uL   Basophil Absolute 0.0 0.0 - 0.2 x 10*3/uL  iStat Lactate - [place] Only  Result Value Ref Range   Lactate WB, Venous 1.42 0.90 - 1.70 MMOL/L  Comprehensive Metabolic Panel  Result Value Ref Range   Sodium 132 (L) 135 - 146 MMOL/L   Potassium 3.9 3.5 - 5.3 MMOL/L   Chloride 100 98 - 110  MMOL/L   CO2 25 23 - 30 MMOL/L   BUN 7 (L) 8 - 24 MG/DL   Glucose 859 (H) 70 - 99 MG/DL   Creatinine 9.19 9.49 - 1.50 MG/DL   Calcium  9.2 8.5 - 10.5 MG/DL   Total Protein 7.7 6.0 - 8.3 G/DL   Albumin  4.0 3.5 - 5.0 G/DL   Total Bilirubin 1.6 (H) 0.1 - 1.2 MG/DL   Alkaline Phosphatase 63 25 - 125 IU/L or U/L   AST (SGOT) 21 5 - 40 IU/L or U/L   ALT (SGPT) 13 5 - 50 IU/L or U/L   Anion Gap 7 4 - 14 MMOL/L   Est. GFR >90 >=60 ML/MIN/1.73 M*2   CBC and Differential   Narrative   The following orders were created for panel order CBC and Differential. Procedure                               Abnormality         Status                    ---------                               -----------         ------                    CBC and Differential[[id number]]         Abnormal            Final result               Please view results for these tests on the individual orders.  COVID-19 and Influenza A/B PCR (Cepheid)   Specimen: Nasal; Nasopharyngeal Swab  Result Value Ref Range   SARS-COV-2 Negative Negative   SARS-COV-2 COMMENT      Positive results are indicative of active infection  with SARS-CoV-2; clinical correlation with patient history and other diagnostic information is necessary to determine patient infection status.  Negative results do not preclude SARS-CoV-2 infection and should not be used as the sole basis for treatment or other patient management decisions.   SARS-COV-2 METHOD      This test was performed on the Cepheid GeneXpert instrument at [facility].  The Xpress SARS-CoV-2 test is only for use under the Food and Drug Administration's Emergency Use Authorization.   Influenza A Negative Negative   Influenza B Negative Negative      Pertinent labs & imaging results that were available during my care of the patient were reviewed by me and considered in my medical decision making (see chart for details).  ED Clinical Impression   1. Fever in adult   2. Hypoxia   3.  Bronchitis     ED Medications:  Medications  albuterol 2.5 mg /3 mL (0.083 %) nebulizer solution 2.5 mg (has no administration in time range)  ipratropium (ATROVENT) 0.02 % nebulizer solution 0.5 mg (has no administration in time range)  azithromycin (ZITHROMAX) 500 mg in sodium chloride  0.9 % 250 mL IVPB (500 mg Intravenous New Bag 03/08/21 1631)  cefTRIAXone (ROCEPHIN) 1 g in sodium chloride  0.9 % 100 mL MBP (has no administration in time range)  azithromycin (ZITHROMAX) 500 mg in sodium chloride  0.9 % 250 mL IVPB (has no administration in time range)  0.9%  NaCl infusion (has no administration in time range)  ipratropium 0.5 mg + albuterol 2.5 mg nebulizer solution (DuoNeb equivalent) (has no administration in time range)  0.9% NaCl bolus (1,000 mLs Intravenous New Bag 03/08/21 1607)  cefTRIAXone (ROCEPHIN) 1 g in sodium chloride  0.9 % 100 mL MBP (0 g Intravenous Stopped 03/08/21 1631)  iohexoL (OMNIPAQUE 350) injection 80 mL (80 mLs Intravenous Given 03/08/21 1618)    ED Course and Medical Decision Making  MDM     Patient's diagnostic work-up and assessment this point is concerning for the obvious shortness of breath and hypoxia concerns.  Is now using some supplemental oxygen in order to be able to saturations above 93%.  He is presently on 4 to 5 L by nasal cannula.  Patient's congested cough is noted.  Hand-held nebulizers have been utilized IV antibiotics have been started patient's chest x-ray has been reviewed in detail by me there is no active infiltrate that is noted the patient will be actually sent to CT scan for further evaluation to ensure no evidence of a pulmonary embolus.  I have done blood cultures because the patient's febrile presentation.  Patient really once again reaffirms he is not using IV drugs.  Prior chest x-rays were utilized.  Prior lab work evaluated noting the patient's white count of 18,000.  While the patient does not have an overt pneumonia again antibiotics  have been started for infection management.  Will be admitted by Dr. Satira  Patient's presentation is most consistent with acute presentation with potential threat to life or bodily function.    Provider time spent in patient care today, inclusive of but not limited to clinical reassessment, review of diagnostic studies, and discharge preparation, was greater than 30 minutes.    03/08/2021 5:23 PM      This document serves as a record of services personally performed by Carlin Candle, MD. It was created on their behalf by Philis Ronnald Silvan, Medical Scribe, a trained medical scribe. The creation of this record is the provider's dictation and/or activities during  the visit.   Electronically signed by: Philis Ronnald Silvan, Medical Scribe 03/08/2021 3:16 PM       Electronically signed by: Carlin Royden Lou Mickey., MD 03/08/21 1723

## 2021-03-09 NOTE — Unmapped External Note (Signed)
 Problem: Health Behavior: Goal: MCB Ability to state ways to decrease the risk of falls will improve by discharge Description: Ability to state ways to decrease the risk of falls will improve by discharge Outcome: Progressing   Problem: Safety: Goal: Will remain free from falls by discharge Description: Will remain free from falls by discharge Outcome: Progressing   Problem: Health Behavior: Goal: MCB Understanding of proper administration and use of medicines will improve Description: Understanding of proper administration and use of medicines will improve Outcome: Progressing Goal: Ability to identify factors that increase the pain will improve Description: Ability to identify factors that increase the pain will improve Outcome: Progressing Goal: Ability to state and carry out methods to decrease the pain will improve Description: Ability to state and carry out methods to decrease the pain will improve Outcome: Progressing Goal: MCB Ability to notify healthcare provider of pain before it becomes unmanageable or unbearable will improve Description: Ability to notify healthcare provider of pain before it becomes unmanageable or unbearable will improve Outcome: Progressing   Problem: Medication: Goal: Establishment of normal bowel function will be supported Description: Establishment of normal bowel function will be supported Outcome: Progressing Goal: Risk for medication side effects will decrease Description: Risk for medication side effects will decrease Outcome: Progressing Goal: Remains free from injury during hospital stay Description: Remains free from injury during hospital stay Outcome: Progressing   Problem: Sensory: Goal: Ability to develop a pain control plan will improve Description: Ability to develop a pain control plan will improve Outcome: Progressing Goal: Satisfaction with pain management regimen will improve in 24 hours Description: Satisfaction with pain  management regimen will improve in 24 hours Outcome: Progressing   Problem: Activity: Goal: Fatigue Description: Fatigue will decrease by discharge Outcome: Progressing Goal: Risk for Activity Intolerance Description: Risk for activity intolerance will decrease by discharge Outcome: Progressing   Problem: Cognitive: Goal: MCB Knowledge of disease or condition will improve by discharge Description: Knowledge of disease or condition will improve by discharge Outcome: Progressing Goal: MCB Knowledge of the prescribed therapeutic regimen will improve by discharge Description: Knowledge of the prescribed therapeutic regimen will improve by discharge Outcome: Progressing   Problem: Coping: Goal: Ability to identify and develop effective coping behavior will improve - LTG Description: Ability to identify and develop effective coping behavior will improve Outcome: Progressing Goal: Anxiety Level Description: Level of anxiety will decrease 48 hours after admission Outcome: Progressing   Problem: Health Behavior: Goal: MCB Ability to manage health-related needs will improve by discharge Description: Ability to manage health-related needs will improve by discharge Outcome: Progressing   Problem: Nutritional: Goal: Maintains Adequate Nutrition Description: Maintenance of adequate nutrition will improve 48 hours after admission Outcome: Progressing   Problem: Respiratory: Goal: Maintains Clear Airway Description: Ability to maintain a clear airway will improve in 24 hours Outcome: Progressing Goal: Oxygenation Levels Description: Levels of oxygenation will improve in 24 hours Outcome: Progressing Goal: Ability to maintain adequate ventilation will improve in 24 hours Description: Ability to maintain adequate ventilation will improve in 24 hours Outcome: Progressing   Problem: Activity: Goal: Ability to tolerate increased activity will improve Description: Ability to tolerate  increased activity will improve Outcome: Progressing   Problem: Cognitive: Goal: Knowledge of disease or condition will improve - LTG Description: Knowledge of disease or condition will improve Outcome: Progressing Goal: MCB Understanding of proper administration and use of medicines will improve Description: Understanding of proper administration and use of medicines will improve Outcome: Progressing   Problem: Respiratory:  Goal: Ability to maintain adequate ventilation will improve by discharge Description: Ability to maintain adequate ventilation will improve by discharge Outcome: Progressing Goal: Ability to maintain arterial blood gas levels within normal range will improve by discharge Description: Ability to maintain arterial blood gas levels within normal range will improve by discharge Outcome: Progressing Goal: Ability to maintain normal pulse oximetry readings will improve by discharge Description: Ability to maintain normal pulse oximetry readings will improve by discharge Outcome: Progressing     Electronically signed by: Maxie E Ferrer, RN 03/09/21 1330

## 2021-03-09 NOTE — Unmapped External Note (Signed)
  Care Coordination Adult  Patient Account Number: [id number]        Patient:  Leonard Guerrero  MRN: [id number] DOB:  06/16/1975 [id number] Service: Hospitalist Location: [phone]   Live with Ex-wife who provides transportation. Patient receives disability income.  Patient states he has court next Thursday then will go to jail for 30 days. He plans to detox while in jail   Info & Contacts Assessment Completed: In-person interview with Patient The patient's status at this time is:: Able to communicate Prior to admission, patient resided at: Private residence Prior to admission, patient lived with : Spouse/Significant Other (ex-wife) The patient's decision maker is:: Patient At discharge, will patient return to prior residence: Yes Barriers to education: No barriers     Assessment Is this patient at baseline?: Yes Was patient independent with ADLs prior to admission?: Yes Was patient independent with mobility prior to admission?: Yes Does this patient have or need any DME?: Undetermined, CM/SW will continue to assess Does this patient have or need any home health services?: Undetermined, CM/SW will continue to assess Does this patient have or need any personal care services?: No Does this patient have an LVAD?: No  Social Does patient have an acute substance abuse issue?: Per patient report Per Patient Record: Alcohol, Cocaine/crack, THC Has patient tried substance use counseling in the past?: No Is patient currently receiving counseling services?: No Is patient currently interested in substance abuse counseling?: No Is the patient on any medication assisted treatment for substance abuse?: No Suspicion/Signs/Symptoms of Abuse: No Suspicion/Signs/Symptoms of Neglect: No Legal Problems: Yes Patient's Religious/Spiritual Information likely to impact Medical Treatment: No  Acute Rehab Patients Only Does this patient require acute rehab services?: No      Educational/Vocational History Barriers to education: No barriers                          Discharge How will patient obtain prescription meds at discharge?: Medicaid Type of Payer Source: Medicaid How will this patient obtain follow-up care after discharge?: No outpatient MD, will notify team How will this patient reach the discharge destination?: Family/Friends Is this a Chronic Dialysis patient?: No      Title: Care Coordination ()    Topic: Discharge Instructions (Resolved)    Point: Discussed the tentative discharge plan (Resolved)    Learning Progress Summary         Patient Acceptance, Explanation, Verbalizes Understanding by CO at 03/09/2021 1252                     User Key    Initials Effective Dates Name Provider Type Discipline   CO 04/09/20 -  Charmaine GORMAN Hacking, RN Case Manager Nurse          Charmaine GORMAN Hacking, RN [phone]   Electronically signed by: Charmaine GORMAN Hacking, RN 03/09/21 1254

## 2021-03-10 NOTE — Unmapped External Note (Signed)
   Care Coordination Discharge Note        Patient:  Leonard Guerrero  MRN: [id number] DOB:  08/26/1975  Service: Hospitalist Location: [phone]   Inpatient  Disposition upon Discharge: Home or Self Care   Discharge Disposition upon Discharge: Home or Self Care  Discharge Referrals Case closed, patient/family in agreement with disposition plan: Yes     GRAYLON MLISS DEL, RN  MLISS GRAYLON, RN CM [phone]    Electronically signed by: MLISS DEL GRAYLON, RN 03/10/21 [phone]

## 2021-03-10 NOTE — Discharge Summary (Signed)
 Medicine Discharge Summary  Patient ID: Leonard Guerrero [id number] 46 y.o. 03/02/1975  Admit date: 03/08/2021  Discharge date and time: 03/10/2021  Admitting Physician: Emery GORMAN Barrs, MD  Discharge Physician: Jama Rome Louder, MD  Admission Diagnoses: Bronchopneumonia  Discharge Diagnoses:  Principal Problem:   Bronchopneumonia due to bacteria Resolved Problems:   * No resolved hospital problems. *   Indication for Admission: Bronchopneumonia  History of present illness taken from history and physical dictated 109/5: 46 year old male with PMH of homelessness, schizoaffective disorder, paranoid type delusional disorder, alcohol abuse, methamphetamine abuse, current tobacco use presented to the ED with shortness of breath and cough.  Patient endorses 4 days of severe cough, shortness of breath, chest pain and subjective fevers.  Patient had decreased p.o. intake.  Described chest pain is intermittent and occurs with coughing and lying down.  Patient report he drinks 15 cans of NaturaLyte daily, he drinks till he cannot drink anymore he states.  He smokes 2 packs/day of cigarettes.  He occasionally uses cocaine and amphetamine, snorts it.   Hospital Course:  1.  Acute hypoxemic respiratory failure secondary to bronchopneumonia - hypoxemia has resolved with treating underlying etiology.  White blood cell count trending down patient is afebrile.  Patient will complete antibiotic course following discharge from the hospital.  DuoNeb nebulizer treatments have been prescribed.  Patient is instructed to use as a scheduled medication for the next 7 days and then change to 4 times daily as needed for shortness of breath or wheezing.  2.  Abnormal CT scan -abnormalities appreciated on CT scan should be followed up in the next 6 to 12 months with repeat imaging  3.  Alcohol dependence -patient was followed with CIWA protocol during hospitalization.  Patient should continue with multivitamin,  thiamine  and folic acid  treatment.  Absolute cessation of alcohol is strongly recommended  4.  Ongoing tobacco use -cessation recommended  5.  Substance abuse -cessation recommended.  Patient had indicated that he will detox while he is incarcerated  6.  Schizoaffective/bipolar disorder -continue outpatient medication regimen as prior to admission    Patient's Ordered Code Status: Full Code  Consults:  Orders Placed This Encounter  Procedures  . Consult to Hospitalist (General)    Significant Diagnostic Studies:  CT pulmonary angiogram 2/2: 1. Diffuse bronchial wall thickening with right upper and middle  lobe peribronchovascular tree-in-bud nodularity suggestive of  bronchopneumonia. No follow-up needed if patient is low-risk (and  has no known or suspected primary neoplasm). Non-contrast chest CT  can be considered in 12 months if patient is high-risk. This  recommendation follows the consensus statement: Guidelines for  Management of Incidental Pulmonary Nodules Detected on CT Images:  From the Fleischner Society 2017; Radiology 2017; 284:228-243.  2. Right hilar lymphadenopathy likely reactive in etiology.  3. No central or segmental pulmonary embolus. Limited evaluation  more distally.  4. Aortic Atherosclerosis (ICD10-I70.0).   Chest x-ray 2/2: Bilateral lower lobe bronchial thickening concerning for bronchitis.  No focal consolidation or pleural effusion.   Patient Instructions:  Discharge Medications:   .    START taking these medications   acetaminophen  325 MG tablet Commonly known as: TYLENOL  Dose: 650 mg Instructions: Take 2 tablets (650 mg total) by mouth every 6 (six) hours as needed for Pain, Headaches or Fever (Mild pain or fever greater than 101 F).   azithromycin 250 MG tablet Commonly known as: ZITHROMAX Dose: 250 mg Instructions: Take 1 tablet (250 mg total) by mouth daily for 3  days.   benzonatate  100 MG capsule Commonly known as: TESSALON   PERLES Dose: 100 mg Instructions: Take 1 capsule (100 mg total) by mouth every 8 (eight) hours as needed for up to 14 days for Cough.   cefdinir 300 MG capsule Commonly known as: OMNICEF Dose: 300 mg Instructions: Take 1 capsule (300 mg total) by mouth 2 times daily for 7 days.   folic acid  800 MCG tablet Commonly known as: FOLVITE  Dose: 0.8 mg Instructions: Take 1 tablet (0.8 mg total) by mouth daily. Indication: inadequate folic acid  START Date: March 11, 2021   ipratropium-albuteroL 0.5 mg-3 mg(2.5 mg base)/3 mL nebulizer solution Commonly known as: DUO-NEB Dose: 3 mL Instructions: Inhale 3 mLs by nebulization 4 times daily for 100 doses. Use for 7 days.  After 7 days, change to 4 times daily as needed for shortness of breath or wheezing   multivitamin tablet Commonly known as: THERAGRAN Dose: 1 tablet Instructions: Take 1 tablet by mouth daily. START Date: March 11, 2021   thiamine  HCl 100 MG tablet Commonly known as: VITAMIN B-1 Dose: 100 mg Instructions: Take 1 tablet (100 mg total) by mouth daily for 30 days. START Date: March 11, 2021     CHANGE how you take these medications   Invega  Trinza 546 mg/1.75 mL Syrg What changed: Another medication with the same name was removed. Continue taking this medication, and follow the directions you see here. Dose: 546 mg Instructions: Inject 1.75 mLs (546 mg total) into the muscle every 3 months. Due feb 28 Generic drug: paliperidone  palm (49-month)     CONTINUE taking these medications   traZODone  150 MG tablet Commonly known as: DESYREL  Dose: 150 mg Instructions: Take 1 tablet (150 mg total) by mouth nightly.         Discharge Orders and Instructions    Diet At Discharge   Complete by: As directed    Recommended diet at discharge: Regular diet   Activity At Discharge   Complete by: As directed    Recommended activity at discharge: Activity as tolerated   Contact Us  / Call Us  To Schedule An Appointment    Complete by: As directed    Call: Establish care and follow-up with primary care physician in 1 to 2 weeks      Follow-up Appointments    Electronically signed by: Jama Rome Louder, MD 03/10/2021 9:07 AM  Time spent on discharge: 35 minutes    Electronically signed by: Jama Rome Louder, MD 03/10/21 [phone]

## 2021-03-15 NOTE — ED Notes (Signed)
 Pt educated on d/c paperwork. Pt verbalized understanding & denies any questions at this time. RR even & unlabored with NAD noted at d/c. Pt assisted via wheelchair on d/c.

## 2021-03-15 NOTE — Telephone Encounter (Signed)
 OK   Electronically signed by: Richardson Rumalda Metro, MD 03/15/21 [phone]

## 2021-03-15 NOTE — Telephone Encounter (Signed)
 Would you be willing to see this pt for seizures ?   Thank you

## 2021-09-04 DEATH — deceased
